# Patient Record
Sex: Male | Born: 1935 | Race: White | Hispanic: No | Smoking: Never smoker
Health system: Southern US, Community
[De-identification: ages and names within clinical notes are randomized; demographics above are authoritative.]

## PROBLEM LIST (undated history)

## (undated) DIAGNOSIS — G459 Transient cerebral ischemic attack, unspecified: Secondary | ICD-10-CM

## (undated) DIAGNOSIS — K219 Gastro-esophageal reflux disease without esophagitis: Secondary | ICD-10-CM

## (undated) DIAGNOSIS — E785 Hyperlipidemia, unspecified: Secondary | ICD-10-CM

## (undated) DIAGNOSIS — I2510 Atherosclerotic heart disease of native coronary artery without angina pectoris: Secondary | ICD-10-CM

## (undated) DIAGNOSIS — C679 Malignant neoplasm of bladder, unspecified: Secondary | ICD-10-CM

## (undated) DIAGNOSIS — E119 Type 2 diabetes mellitus without complications: Secondary | ICD-10-CM

## (undated) DIAGNOSIS — I639 Cerebral infarction, unspecified: Secondary | ICD-10-CM

## (undated) DIAGNOSIS — M1990 Unspecified osteoarthritis, unspecified site: Secondary | ICD-10-CM

## (undated) DIAGNOSIS — E669 Obesity, unspecified: Secondary | ICD-10-CM

## (undated) DIAGNOSIS — F419 Anxiety disorder, unspecified: Secondary | ICD-10-CM

## (undated) DIAGNOSIS — I10 Essential (primary) hypertension: Secondary | ICD-10-CM

## (undated) HISTORY — DX: Transient cerebral ischemic attack, unspecified: G45.9

## (undated) HISTORY — PX: BLADDER REPAIR: SHX76

## (undated) HISTORY — DX: Atherosclerotic heart disease of native coronary artery without angina pectoris: I25.10

## (undated) HISTORY — DX: Hyperlipidemia, unspecified: E78.5

## (undated) HISTORY — DX: Essential (primary) hypertension: I10

## (undated) HISTORY — DX: Cerebral infarction, unspecified: I63.9

## (undated) HISTORY — DX: Gastro-esophageal reflux disease without esophagitis: K21.9

## (undated) HISTORY — PX: TOTAL KNEE ARTHROPLASTY: SHX125

## (undated) HISTORY — PX: NASAL SINUS SURGERY: SHX719

## (undated) HISTORY — DX: Malignant neoplasm of bladder, unspecified: C67.9

## (undated) HISTORY — DX: Obesity, unspecified: E66.9

## (undated) HISTORY — PX: CORONARY STENT PLACEMENT: SHX1402

## (undated) HISTORY — PX: SHOULDER OPEN ROTATOR CUFF REPAIR: SHX2407

## (undated) HISTORY — DX: Anxiety disorder, unspecified: F41.9

## (undated) HISTORY — DX: Type 2 diabetes mellitus without complications: E11.9

## (undated) HISTORY — DX: Unspecified osteoarthritis, unspecified site: M19.90

---

## 1998-09-30 ENCOUNTER — Ambulatory Visit (HOSPITAL_COMMUNITY): Admission: RE | Admit: 1998-09-30 | Discharge: 1998-09-30 | Payer: Self-pay | Admitting: Orthopedic Surgery

## 2001-02-01 ENCOUNTER — Other Ambulatory Visit: Admission: RE | Admit: 2001-02-01 | Discharge: 2001-02-01 | Payer: Self-pay | Admitting: *Deleted

## 2001-02-01 ENCOUNTER — Encounter (INDEPENDENT_AMBULATORY_CARE_PROVIDER_SITE_OTHER): Payer: Self-pay | Admitting: Specialist

## 2002-12-03 ENCOUNTER — Encounter: Payer: Self-pay | Admitting: Orthopedic Surgery

## 2002-12-07 ENCOUNTER — Observation Stay (HOSPITAL_COMMUNITY): Admission: RE | Admit: 2002-12-07 | Discharge: 2002-12-08 | Payer: Self-pay | Admitting: Orthopedic Surgery

## 2004-11-13 ENCOUNTER — Ambulatory Visit: Payer: Self-pay | Admitting: Oncology

## 2007-10-12 ENCOUNTER — Inpatient Hospital Stay (HOSPITAL_COMMUNITY): Admission: RE | Admit: 2007-10-12 | Discharge: 2007-10-19 | Payer: Self-pay | Admitting: Orthopedic Surgery

## 2011-04-13 NOTE — Op Note (Signed)
NAME:  Clayton Johnson, Clayton Johnson NO.:  [id number]   MEDICAL RECORD NO.:  [id number]          PATIENT TYPE:  INP   LOCATION:  U981                         FACILITY:  [facility]   PHYSICIAN:  Marlowe Kays, M.D.  DATE OF BIRTH:  [birth year]   DATE OF PROCEDURE:  10/12/2007  DATE OF DISCHARGE:                               OPERATIVE REPORT   PREOPERATIVE DIAGNOSIS:  Osteoarthritis right knee.   POSTOPERATIVE DIAGNOSIS:  Osteoarthritis right knee.   OPERATION:  Osteonics Scorpio NRG total knee replacement right.   SURGEON:  Marlowe Kays, M.D.   ASSISTANTDruscilla Brownie. Cherlynn June.   ANESTHESIA:  General.   INDICATIONS FOR PROCEDURE:  A tricompartmental arthritis with marked  narrowing of medial joint with subluxation medially of the femur on the  tibia.  Accordingly I elected because he was a large individual I  elected to use a tibial extension as discussed below.   PROCEDURE IN DETAIL:  Prophylactic antibiotics.  Satisfied general  anesthesia.  Lateral hip stabilizer, surefoot and pneumatic tourniquet  the right leg was prepped with DuraPrep and draped in sterile field.  IV  employed.  Time-out performed.  Right leg was Esmarch'd out sterilely.  Tourniquet at 325 mmHg.  Vertical midline incision with median  parapatellar incision to open the joint.  Pace anserinus and medial  collateral ligament were dissected off the proximal tibia.  The patellar  mechanism was freed up.  Patella everted, the knee flexed.  Osteophytes  were removed from around the femur and the patella.  Patella was sized  at a 28.  I removed a portion of the ACL, PCL and anterior portions of  both menisci and placed a 5/16 inch drill hole in the distal femur  followed by canal finder and then the axis aligner set to 5 degree  valgus cut.  I elected to take a 10 mm cut since he had no substantial  flexion contracture.  After making this distal femoral cut we then sized  the femur at a #9.  The jig  for making the distal femoral cut was placed  over the previously determined lines from the sizing gauge and anterior  and posterior cuts and posterior and anterior chamferings made.  I then  went to the tibia where remnants of menisci and ACL and PCL were  removed.  A leveling cut was made and the tibia sized and likewise a #9.  I then placed a tibial base plate and made my initial intramedullary  drill hole followed by stepcut drill and canal finder.  I then used  intramedullary rod with the template for making a 9 degree and then I  used the guide for making a 90 degree cut removing 4 mm of the depressed  medial tibial plateau.  After making this cut I then placed a laminar  spreader and removed soft tissue and remnants of bone from the posterior  femoral condyles.  The jig for creating the post was then applied.  In  this system no patellar groove was indicated.  After making some  preliminary cuts with a saw to soften  up the bone and remove bone pieces  in the intercondylar area I then used the cutting instrument followed by  the impactor which completed the notchplasty.  I then went through a  trial reduction and found a 10 spacer seemed to fit nicely.  Using the  extramedullary guide splitting the bimalleolar distance I used the  baseplate markings on the anterior tibia for reference later.  While the  knee was in extension I then made a 10 mm recessed cut for 20 mm patella  followed by placing the guide from three fixation holes.  I then placed  the patella trimming excess bone from around the perimeter.  I then  returned to the tibia and we applied the baseplate saddled previously  marked scribe lines with stabilizing three pins and then used the tripod  apparatus to ream for the tibial keel up to a 9 cemented.  I then used  the additional reamer for the 80 mm stem working up to a 16 mm.  We then  went through a trial reduction with the 16 mm x 80 mm tibial extension  and found  that the base plate fit nicely on the tibia.   Accordingly we went ahead and water picked the knee while the components  were individually opened and the tibial extension attached.  Methylmethacrylate was then mixed and when it had hardened appropriately  we began gluing in the components starting first with the tibia with no  glue distal to the extension.  After impacting the tibial component and  removing excess methylmethacrylate we then glued in the femur and then  held it, after impacting it, held the femur in compacted position with  an 8 mm spacer and the knee extension.  We then glued in the patella  using patellar holding clamp.  When the glucose had hardened I removed  small particles of excess methylmethacrylate from around the knee and we  then went through another trial reduction, finding the 10 mm spacer was  the appropriate width.  Accordingly I went ahead and after irrigating  the tray placed the final prosthesis, 10 mm posterior stabilized, the  knee was reduced and found to be nice and stable.  Lateral release was  performed.  Hemovac inserted and the wound closed in layers with  interrupted #1 Vicryl in two layers in the quadriceps tendon, distally  in the synovium and capsule and in the deep subcutaneous tissue.  Superficial subcutaneous tissues were closed with 2-0 Vicryl, staples in  the skin.  Betadine, Adaptic dry sterile dressing were applied.  Tourniquet was released with about 101 minutes of operating time.  He  tolerated the procedure well and was taken to the recovery room in  satisfactory condition with no known complications.           ______________________________  Marlowe Kays, M.D.     JA/MEDQ  D:  10/12/2007  T:  10/13/2007  Job:  [id number]

## 2011-04-13 NOTE — H&P (Signed)
NAME:  Clayton Johnson, Clayton Johnson NO.:  [id number]   MEDICAL RECORD NO.:  [id number]          PATIENT TYPE:  INP   LOCATION:  NA                           FACILITY:  [facility]   PHYSICIAN:  Marlowe Kays, M.D.  DATE OF BIRTH:  1934/06/10   DATE OF ADMISSION:  10/12/2007  DATE OF DISCHARGE:                              HISTORY & PHYSICAL   CHIEF COMPLAINT:  Pain in my right knee.   HISTORY OF PRESENT ILLNESS:  This 77 year old white male seen by [place] for  continuing and progressive problems concerning his right knee.  He has  documented osteoarthritis with tricompartmental arthritis with near bone-  on-bone deformities.  We have tried him with supplementation as well as  corticosteroids in the knee and exercise program and unfortunately, this  has not helped him.  He has extreme difficulty squatting down and coming  to a standing position.  He has to be helped up during this evaluation.  After much consideration including the risks and benefits of surgery, we  decided he would benefit from surgical intervention and is being  admitted for total knee replacement arthroplasty of right knee.   This patient lives alone, but he will have a friend to stay with him for  awhile.  He will also have home health.   PAST MEDICAL HISTORY:  1. Insulin-dependent diabetic.  2. Hypertension.  3. Dyslipidemia.  4. Reflux.   CURRENT MEDICATIONS:  1. Lantus insulin 50 units per day.  2. Humalog p.r.n.  3. Actos 45 mg one per day.  4. Metformin hydrochloride 1000 mg two per day.  5. Omeprazole 20 mg b.i.d.  6. Lipitor 20 mg daily.  7. Hydrochlorothiazide daily.  8. Quinapril hydrochloride 10 mg daily.  9. Clonidine 0.2 mg daily.   PRIMARY CARE PHYSICIAN:  Dr. Roney Marion is his family physician and  Dr. Leslie Dales is his endocrinologist.   ALLERGIES:  He is allergic to certain foods and inhalants, however, he  has no medical allergies.   PAST SURGICAL HISTORY:  1. Right knee scope in  December 1999.  2. Right shoulder surgery with reconstruction secondary to dislocation      in the early 60s.  3. He has had some sort of left shoulder surgery.   FAMILY HISTORY:  Positive for heart disease in his brother, diabetes in  brother and sister and stroke in his father.   SOCIAL HISTORY:  The patient divorced, retired and has no intake of  alcohol or tobacco products.  He does have three children.   REVIEW OF SYSTEMS:  CNS:  No seizure disorder, paralysis, numbness,  double vision.  RESPIRATORY:  No productive cough, no hemoptysis or  shortness of breath.  CARDIOVASCULAR:  No chest pain, no angina,  orthopnea.  GASTROINTESTINAL:  No nausea, vomiting, melena or bloody  stool.  GENITOURINARY:  No discharge, dysuria or hematuria.  MUSCULOSKELETAL:  Primarily in present illness with his right knee.   PHYSICAL EXAMINATION:  GENERAL:  Alert, cooperative and friendly, 27-  year-old, white male who was alert and oriented x3.  He is in no acute  distress.  VITAL SIGNS:  Blood pressure 120/66, pulse 80, respirations of 12.  HEENT:  Normocephalic.  Wears glasses.  PERRLA, EOMI.  Oropharynx is  clear.  NECK:  Supple with no lymphadenopathy.  CHEST:  Clear to auscultation, however, breath sounds are diminished on  the left in all lung fields.  HEART:  Regular rate and rhythm.  No murmurs are heard.  ABDOMEN:  Obese, soft, nontender.  Liver and spleen not felt.  GENITALIA:  Rectal not done, not pertinent to present illness.  EXTREMITIES:  The patient has crepitus range of motion of the right  knee.  There is fullness about the knee as well and tenderness in the  popliteal area.   ADMISSION DIAGNOSIS:  1. Osteoarthritis of the right knee.  2. Hypertension.  3. Reflux.  4. Insulin-dependent diabetes mellitus.  5. Dyslipidemia.  6. Diminished lung sounds on the left.   PLAN:  The patient will undergo right total knee replacement  arthroplasty.  He is to have a chest x-ray on his  preoperative visit to  the hospital.  Hopefully, there will be no abnormal pathological  findings in the lungs that is just an auscultation situation.      Dooley L. Cherlynn June.    ______________________________  Marlowe Kays, M.D.    DLU/MEDQ  D:  10/04/2007  T:  10/05/2007  Job:  [id number]   cc:   Roney Marion, M.D.   Veverly Fells. Al[place]imer, M.D.  Fax: [phone]

## 2011-04-16 NOTE — Op Note (Signed)
NAME:  Clayton Johnson, Clayton Johnson                      ACCOUNT NO.:  [id number]   MEDICAL RECORD NO.:  [id number]                   PATIENT TYPE:  AMB   LOCATION:  DAY                                  FACILITY:  [facility]   PHYSICIAN:  Marlowe Kays, M.D.               DATE OF BIRTH:  1934/05/03   DATE OF PROCEDURE:  12/07/2002  DATE OF DISCHARGE:                                 OPERATIVE REPORT   PREOPERATIVE DIAGNOSES:  1. Labral and biceps tendon disruption.  2. Chronic impingement syndrome with probable full-thickness rotator cuff     tear.   POSTOPERATIVE DIAGNOSES:  1. Labral and biceps tendon disruption.  2. Partial rotator cuff tear.   OPERATION:  1. Left shoulder arthroscopy with debridement of the underneath surface of     the rotator cuff, long head of the biceps tendon, and labrum.  2. Arthroscopic subacromial decompression.   SURGEON:  Illene Labrador. [place], M.D.   ASSISTANTDruscilla Brownie. Idolina Primer, P.A.-C.   ANESTHESIA:  General.   PATHOLOGY AND JUSTIFICATION FOR PROCEDURE:  Chronic pain, left shoulder with  MRI demonstrating what was felt to be a very macerated rotator cuff with  probable full-thickness tears and the other abnormalities noted on the  diagnosis.  At surgery, there did not appear to be any full-thickness  rotator cuff tears, both visually and also the fact that no arthroscopic  fluids from the glenohumeral joint went up into the subacromial joint.   DESCRIPTION OF PROCEDURE:  Prophylactic antibiotics, satisfactory general  anesthesia, beach chair position on the Schlein frame, the left shoulder  girdle was prepped with DuraPrep and draped in a sterile field, and the  anatomy of the shoulder joint was marked out, and a lateral portal,  posterior soft spot portal, and the subacromial space were all infiltrated  with 0.5% Marcaine with adrenalin.  Through a posterior soft spot portal, I  used a blunt trocar to easily enter the glenohumeral joint with  significant  disruption of the labrum but no complete detachment, a good bit of fraying  of the long head of the biceps tendon but basically, it was intact, and a  good of fraying and synovectomy on the surface of the rotator cuff.  There  was also a little wear area on the posterior humeral head from history of  prior dislocation possibly.  After noting the pathology, I advanced the  scope between the long head of the biceps tendon, subscapularis anteriorly  and made a stab wound there and placed a metal cannula over the switching  stick and then used a 4.2 shaver which I introduced in the anterior joint  and then debrided down the labrum, the long head of the biceps tendon, and  the surface of the rotator cuff.  Again, no fluid was noted to be exiting  into the subacromial space.  Consequently, after draining the glenohumeral  joint of all fluid possible, I then  redirected the scope in the subacromial  area and through a lateral portal, used a 4.2 shaver.  He had a large amount  of bursal tissue and some fraying of the rotator cuff but basically once  again, the fact that he did not have a full-thickness rotator cuff was  confirmed.  I then used the 4.2 shaver through the lateral portal to clean  up the bursa and then used the ArthroCare vaporizer to vaporize soft tissue  on the underneath surface of the acromion, going back medial to the [facility]  joint.  This was causing some impingement on the underlying rotator cuff.  The used a 4.0 oval bur and began shaving down the underneath surface of the  acromion and AC joint, then went back and forth between the bur, the 4.2  shaver, and the vaporizer until we had thorough decompression.  This was  documented using the vaporizer head with the arm at the side and the arm  abducted.  We then removed all fluid possible from the subacromial space  which was reinfiltrated with 0.5% Marcaine with adrenalin as well as three  portals which were then closed  with 4-0 nylon.  Betadine, Adaptic dry  sterile dressing were applied.  He was placed into a large sling and taken  to the recovery room in satisfactory condition with no known complications.                                               Marlowe Kays, M.D.    JA/MEDQ  D:  12/07/2002  T:  12/07/2002  Job:  [id number]

## 2011-04-16 NOTE — Discharge Summary (Signed)
NAME:  Clayton Johnson, Clayton Johnson              ACCOUNT NO.:  [id number]   MEDICAL RECORD NO.:  [id number]          PATIENT TYPE:  INP   LOCATION:  1611                         [facility]ACILITY[facility]   PHYSICIAN:  Marlowe Kays, M.D.  DATE OF BIRTH:  Nov 04, 1934   DATE OF ADMISSION:  10/12/2007  DATE OF DISCHARGE:  10/19/2007                               DISCHARGE SUMMARY   ADMITTING DIAGNOSES:  1. Osteoarthritis of the right knee.  2. Hypertension.  3. Reflux.  4. Insulin-dependent diabetes mellitus.  5. Dyslipidemia.  6. Noted diminished lung sounds on the left.   DISCHARGE DIAGNOSES:  1. Osteoarthritis of the right knee.  2. Hypertension.  3. Reflux.  4. Insulin-dependent diabetes mellitus.  5. Dyslipidemia.  6. Noted diminished lung sounds on the left.  7. Mild postoperative anemia.  8. Preoperative x-ray with no active cardiopulmonary disease.   OPERATION:  On October 12, 2007, the patient underwent Osteonics  Scorpio NRG total knee replacement arthroplasty to the right knee.  D.L.  Underwood assisted.   BRIEF HISTORY:  This is a 77 year old male with progressive problems  concerning his right knee and documented tricompartmental arthritis.  Viscosupplementation and corticosteroids om the knee with exercises have  not helped him with his right knee.  Due to the fact that he has  interference with his day-to-day activity, he decided to go for total  knee replacement arthroplasty.  Risks and benefits were described to the  patient.   COURSE IN THE HOSPITAL:  The patient tolerated the surgical procedure  quite well.  Placed on Coumadin protocol immediately postoperatively for  the prevention of DVT and continue to do so.  He had urinary retention.  He was placed on Flomax, and he eventually was able to void.   Wound remained clean and dry, with no evidence of an infection.  He ran  intermittent fevers, with temperature elevated to 101.8, but this  varied.  There was some redness  about the wound.  He was placed on  Keflex p.o. after the usual Ancef protocol had run out.  He was afebrile  for 24/48 hours prior to discharge, and when seen by Dr. Simonne Come on  the day of discharge the patient was doing well with physical therapy.  The leg redness had diminished, and he was not febrile.   LABORATORY VALUES:  Hematologically showed a hemoglobin preoperatively  of 13.1, hematocrit of 38.7.  Final hemoglobin was 8.1.  He was  asymptomatic.  Blood chemistries were all essentially normal, other than  elevated glucose.  Sodium varied, and he had a very mild hyponatremia at  132 at discharge.  GFR was 32.  Urinalysis negative for urinary tract  infection.  Electrocardiogram showed normal sinus rhythm Chest x-ray  showed no active cardiopulmonary disease.   Of note, the patient was placed on insulin protocol postoperatively.   CONDITION ON DISCHARGE:  Improved, stable.   PLAN:  The patient was discharged to his home.  Continued weightbearing  as tolerated.  Follow up with his family physicians and internal  medicine doctors as indicated.   MEDICATION AT DISCHARGE:  1. Lantus  50 units in the morning.  2. Humalog Pen 100/140, 6 units as needed.  3. Actos 45 mg in the morning.  4. Metformin 1000 mg b.i.d.  5. Omeprazole 20 mg the morning.  6. Lipitor 20 mg at bedtime.  7. Hydrochlorothiazide 1 in the morning.  8. Quinapril 40 mg in the morning.  9. Clonidine 0.2 mg at bedtime.  10.Aspirin 81 mg daily (will take aspirin while on Coumadin).   MEDICATIONS THAT WE GAVE AT DISCHARGE:  1. Tylox for pain.  2. Keflex as an antibiotic.  3. Robaxin as a muscle relaxant.  4. FeSO4 for his anemia.  5. Coumadin per Coumadin protocol postop.   He is return to the office about 2 weeks after the date of surgery.  Use  dry dressing as needed.  He is encouraged to call should he have any  problems or questions.     Continue diet as at home.      Dooley L. Cherlynn June.    ______________________________  Marlowe Kays, M.D.   DLU/MEDQ  D:  11/01/2007  T:  11/01/2007  Job:  [id number]   cc:   Roney Marion, M.D.   Veverly Fells. [place], M.D.  Fax: [phone]

## 2011-09-07 LAB — PROTIME-INR
INR: 1.5
INR: 1.5
INR: 1.7 — ABNORMAL HIGH
INR: 2 — ABNORMAL HIGH
INR: 1
Prothrombin Time: 18.2 — ABNORMAL HIGH
Prothrombin Time: 20.4 — ABNORMAL HIGH
Prothrombin Time: 22.9 — ABNORMAL HIGH
Prothrombin Time: 23.8 — ABNORMAL HIGH
Prothrombin Time: 13.7

## 2011-09-07 LAB — BASIC METABOLIC PANEL
BUN: 27 — ABNORMAL HIGH
BUN: 20
BUN: 20
CO2: 31
CO2: 29
Calcium: 8.2 — ABNORMAL LOW
Chloride: 95 — ABNORMAL LOW
Chloride: 102
Chloride: 99
Creatinine, Ser: 2.07 — ABNORMAL HIGH
Creatinine, Ser: 1.39
GFR calc Af Amer: 38 — ABNORMAL LOW
GFR calc non Af Amer: 32 — ABNORMAL LOW
GFR calc non Af Amer: 50 — ABNORMAL LOW
GFR calc non Af Amer: 56 — ABNORMAL LOW
Glucose, Bld: 253 — ABNORMAL HIGH
Glucose, Bld: 264 — ABNORMAL HIGH
Glucose, Bld: 304 — ABNORMAL HIGH
Potassium: 4.3
Potassium: 4.9
Potassium: 5
Sodium: 132 — ABNORMAL LOW
Sodium: 139

## 2011-09-07 LAB — CBC
HCT: 23.3 — ABNORMAL LOW
HCT: 29.8 — ABNORMAL LOW
Hemoglobin: 8.7 — ABNORMAL LOW
MCHC: 35.3
MCV: 92.4
Platelets: 145 — ABNORMAL LOW
Platelets: 182
RBC: 2.67 — ABNORMAL LOW
RDW: 14
RDW: 14.5
RDW: 14.4
WBC: 8.3

## 2011-09-07 LAB — CREATININE, SERUM
Creatinine, Ser: 1.26
GFR calc Af Amer: >60

## 2011-09-07 LAB — ABO/RH: ABO/RH(D): O POS

## 2011-09-07 LAB — URINALYSIS, ROUTINE W REFLEX MICROSCOPIC
Glucose, UA: >1000 — AB
Hgb urine dipstick: NEGATIVE
Leukocytes, UA: NEGATIVE
Protein, ur: NEGATIVE
Specific Gravity, Urine: 1.013
pH: 5.5

## 2011-09-07 LAB — URINE MICROSCOPIC-ADD ON: Urine-Other: NONE SEEN

## 2011-09-07 LAB — HEMOGLOBIN: Hemoglobin: 8.1 — ABNORMAL LOW

## 2012-11-08 ENCOUNTER — Other Ambulatory Visit: Payer: Self-pay | Admitting: Neurology

## 2012-11-08 DIAGNOSIS — G459 Transient cerebral ischemic attack, unspecified: Secondary | ICD-10-CM

## 2012-11-08 DIAGNOSIS — I679 Cerebrovascular disease, unspecified: Secondary | ICD-10-CM

## 2012-11-14 ENCOUNTER — Ambulatory Visit
Admission: RE | Admit: 2012-11-14 | Discharge: 2012-11-14 | Disposition: A | Discharge status: Home / Self Care | Payer: Medicare Other | Source: Ambulatory Visit | Attending: Neurology | Admitting: Neurology

## 2012-11-14 DIAGNOSIS — I679 Cerebrovascular disease, unspecified: Secondary | ICD-10-CM

## 2012-11-14 DIAGNOSIS — G459 Transient cerebral ischemic attack, unspecified: Secondary | ICD-10-CM

## 2012-12-05 DIAGNOSIS — G459 Transient cerebral ischemic attack, unspecified: Secondary | ICD-10-CM | POA: Insufficient documentation

## 2012-12-05 DIAGNOSIS — I679 Cerebrovascular disease, unspecified: Secondary | ICD-10-CM | POA: Insufficient documentation

## 2012-12-08 ENCOUNTER — Other Ambulatory Visit: Payer: Self-pay | Admitting: Neurology

## 2012-12-08 DIAGNOSIS — G459 Transient cerebral ischemic attack, unspecified: Secondary | ICD-10-CM

## 2012-12-08 DIAGNOSIS — I679 Cerebrovascular disease, unspecified: Secondary | ICD-10-CM

## 2012-12-14 ENCOUNTER — Ambulatory Visit
Admission: RE | Admit: 2012-12-14 | Discharge: 2012-12-14 | Disposition: A | Discharge status: Home / Self Care | Payer: Medicare Other | Source: Ambulatory Visit | Attending: Neurology | Admitting: Neurology

## 2012-12-14 DIAGNOSIS — G459 Transient cerebral ischemic attack, unspecified: Secondary | ICD-10-CM

## 2012-12-14 DIAGNOSIS — I679 Cerebrovascular disease, unspecified: Secondary | ICD-10-CM

## 2012-12-14 MED ORDER — IOHEXOL 350 MG/ML SOLN
100.0000 mL | Freq: Once | INTRAVENOUS | Status: AC | PRN
  Administered 2012-12-14: 100 mL via INTRAVENOUS

## 2013-03-26 ENCOUNTER — Ambulatory Visit: Payer: Self-pay | Admitting: Neurology

## 2013-03-26 ENCOUNTER — Encounter: Payer: Self-pay | Admitting: Neurology

## 2013-03-26 DIAGNOSIS — I679 Cerebrovascular disease, unspecified: Secondary | ICD-10-CM

## 2013-03-26 DIAGNOSIS — G459 Transient cerebral ischemic attack, unspecified: Secondary | ICD-10-CM

## 2015-09-12 DIAGNOSIS — E782 Mixed hyperlipidemia: Secondary | ICD-10-CM | POA: Insufficient documentation

## 2015-09-12 DIAGNOSIS — I2510 Atherosclerotic heart disease of native coronary artery without angina pectoris: Secondary | ICD-10-CM | POA: Insufficient documentation

## 2016-01-27 DIAGNOSIS — B351 Tinea unguium: Secondary | ICD-10-CM | POA: Insufficient documentation

## 2016-03-10 ENCOUNTER — Other Ambulatory Visit: Payer: Self-pay

## 2017-03-03 DIAGNOSIS — I10 Essential (primary) hypertension: Secondary | ICD-10-CM | POA: Diagnosis not present

## 2017-03-03 DIAGNOSIS — E119 Type 2 diabetes mellitus without complications: Secondary | ICD-10-CM | POA: Diagnosis not present

## 2017-03-03 DIAGNOSIS — I2510 Atherosclerotic heart disease of native coronary artery without angina pectoris: Secondary | ICD-10-CM | POA: Diagnosis not present

## 2017-03-03 DIAGNOSIS — E782 Mixed hyperlipidemia: Secondary | ICD-10-CM | POA: Diagnosis not present

## 2017-03-21 DIAGNOSIS — E119 Type 2 diabetes mellitus without complications: Secondary | ICD-10-CM | POA: Diagnosis not present

## 2017-03-21 DIAGNOSIS — I10 Essential (primary) hypertension: Secondary | ICD-10-CM | POA: Diagnosis not present

## 2017-03-21 DIAGNOSIS — E782 Mixed hyperlipidemia: Secondary | ICD-10-CM | POA: Diagnosis not present

## 2017-03-21 DIAGNOSIS — I2510 Atherosclerotic heart disease of native coronary artery without angina pectoris: Secondary | ICD-10-CM | POA: Diagnosis not present

## 2017-03-23 DIAGNOSIS — Z79899 Other long term (current) drug therapy: Secondary | ICD-10-CM | POA: Diagnosis not present

## 2017-03-23 DIAGNOSIS — Z0000 Encounter for general adult medical examination without abnormal findings: Secondary | ICD-10-CM | POA: Diagnosis not present

## 2017-03-23 DIAGNOSIS — E785 Hyperlipidemia, unspecified: Secondary | ICD-10-CM | POA: Diagnosis not present

## 2017-03-23 DIAGNOSIS — I10 Essential (primary) hypertension: Secondary | ICD-10-CM | POA: Diagnosis not present

## 2017-03-23 DIAGNOSIS — R413 Other amnesia: Secondary | ICD-10-CM | POA: Diagnosis not present

## 2017-03-23 DIAGNOSIS — E1165 Type 2 diabetes mellitus with hyperglycemia: Secondary | ICD-10-CM | POA: Diagnosis not present

## 2017-04-13 DIAGNOSIS — I639 Cerebral infarction, unspecified: Secondary | ICD-10-CM | POA: Diagnosis not present

## 2017-04-13 DIAGNOSIS — R2681 Unsteadiness on feet: Secondary | ICD-10-CM | POA: Diagnosis not present

## 2017-05-02 DIAGNOSIS — M6281 Muscle weakness (generalized): Secondary | ICD-10-CM | POA: Diagnosis not present

## 2017-05-02 DIAGNOSIS — I639 Cerebral infarction, unspecified: Secondary | ICD-10-CM | POA: Diagnosis not present

## 2017-05-02 DIAGNOSIS — R2681 Unsteadiness on feet: Secondary | ICD-10-CM | POA: Diagnosis not present

## 2017-05-05 DIAGNOSIS — I639 Cerebral infarction, unspecified: Secondary | ICD-10-CM | POA: Diagnosis not present

## 2017-05-05 DIAGNOSIS — R2681 Unsteadiness on feet: Secondary | ICD-10-CM | POA: Diagnosis not present

## 2017-05-05 DIAGNOSIS — M6281 Muscle weakness (generalized): Secondary | ICD-10-CM | POA: Diagnosis not present

## 2017-05-16 ENCOUNTER — Ambulatory Visit (INDEPENDENT_AMBULATORY_CARE_PROVIDER_SITE_OTHER): Payer: Medicare Other | Admitting: Neurology

## 2017-05-16 ENCOUNTER — Encounter: Payer: Self-pay | Admitting: Neurology

## 2017-05-16 VITALS — BP 126/64 | HR 65 | Ht 68.5 in | Wt 202.4 lb

## 2017-05-16 DIAGNOSIS — I639 Cerebral infarction, unspecified: Secondary | ICD-10-CM

## 2017-05-16 DIAGNOSIS — I6381 Other cerebral infarction due to occlusion or stenosis of small artery: Secondary | ICD-10-CM

## 2017-05-16 NOTE — Patient Instructions (Addendum)
Remember to drink plenty of fluid, eat healthy meals and do not skip any meals. Try to eat protein with a every meal and eat a healthy snack such as fruit or nuts in between meals. Try to keep a regular sleep-wake schedule and try to exercise daily, particularly in the form of walking, 20-30 minutes a day, if you can.   As far as your medications are concerned, I would like to suggest: Continue current medications  As far as diagnostic testing: CTA of the head and neck  I would like to see you back in 6 months, sooner if we need to. Please call us with any interim questions, concerns, problems, updates or refill requests.   Our phone number is [phone]. We also have an after hours call service for urgent matters and there is a physician on-call for urgent questions. For any emergencies you know to call 911 or go to the nearest emergency room   Ischemic Stroke An ischemic stroke (cerebrovascular accident, or CVA) is the sudden death of brain tissue that occurs when an area of the brain does not get enough oxygen. It is a medical emergency that must be treated right away. An ischemic stroke can cause permanent loss of brain function. This can cause problems with how different parts of your body function. What are the causes? This condition is caused by a decrease of oxygen supply to an area of the brain, which may be the result of:  A small blood clot (embolus) or a buildup of plaque in the blood vessels (atherosclerosis) that blocks blood flow in the brain.  An abnormal heart rhythm (atrial fibrillation).  A blocked or damaged artery in the head or neck.  What increases the risk? Certain factors may make you more likely to develop this condition. Some of these factors are things that you can change, such as:  Obesity.  Smoking cigarettes.  Taking oral birth control, especially if you also use tobacco.  Physical inactivity.  Excessive alcohol use.  Use of illegal drugs,  especially cocaine and methamphetamine.  Other risk factors include:  High blood pressure (hypertension).  High cholesterol.  Diabetes mellitus.  Heart disease.  Being [place] American, Native American, Hispanic, or [place] Native.  Being over age 56.  Family history of stroke.  Previous history of blood clots, stroke, or transient ischemic attack (TIA).  Sickle cell disease.  Being a woman with a history of preeclampsia.  Migraine headache.  Sleep apnea.  Irregular heartbeats, such as atrial fibrillation.  Chronic inflammatory diseases, such as rheumatoid arthritis or lupus.  Blood clotting disorders (hypercoagulable state).  What are the signs or symptoms? Symptoms of this condition usually develop suddenly, or you may notice them after waking up from sleep. Symptoms may include sudden:  Weakness or numbness in your face, arm, or leg, especially on one side of your body.  Trouble walking or difficulty moving your arms or legs.  Loss of balance or coordination.  Confusion.  Slurred speech (dysarthria).  Trouble speaking, understanding speech, or both (aphasia).  Vision changes-such as double vision, blurred vision, or loss of vision-inone or both eyes.  Dizziness.  Nausea and vomiting.  Severe headache with no known cause. The headache is often described as the worst headache ever experienced.  If possible, make note of the exact time that you last felt like your normal self and what time your symptoms started. Tell your health care provider. If symptoms come and go, this could be a sign of a  warning stroke, or TIA. Get help right away, even if you feel better. How is this diagnosed? This condition may be diagnosed based on:  Your symptoms, your medical history, and a physical exam.  CT scan of the brain.  MRI.  CT angiogram. This test uses a computer to take X-rays of your arteries. A dye may be injected into your blood to show the inside of your  blood vessels more clearly.  MRI angiogram. This is a type of MRI that is used to evaluate the blood vessels.  Cerebral angiogram. This test uses X-rays and a dye to show the blood vessels in the brain and neck.  You may need to see a health care provider who specializes in stroke care. A stroke specialist can be seen in person or through communication using telephone or television technology (telemedicine). Other tests may also be done to find the cause of the stroke, such as:  Electrocardiogram (ECG).  Continuous heart monitoring.  Echocardiogram.  Carotid ultrasound.  A scan of the brain circulation.  Blood tests.  Sleep study to check for sleep apnea.  How is this treated? Treatment for this condition will depend on the duration, severity, and cause of your symptoms and on the area of the brain affected. It is very important to get treatment at the first sign of stroke symptoms. Some treatments work better if they are done within 3-6 hours of the onset of stroke symptoms. These initial treatments may include:  Aspirin.  Medicines to control blood pressure.  Medicine given by injection to dissolve the blood clot (thrombolytic).  Treatments given directly to the affected artery to remove or dissolve the blood clot.  Other treatment options may include:  Oxygen.  IV fluids.  Medicines to thin the blood (anticoagulants or antiplatelets).  Procedures to increase blood flow.  Medicines and changes to your diet may be used to help treat and manage risk factors for stroke, such as diabetes, high cholesterol, and high blood pressure. After a stroke, you may work with physical, speech, mental health, or occupational therapists to help you recover. Follow these instructions at home: Medicines  Take over-the-counter and prescription medicines only as told by your health care provider.  If you were told to take a medicine to thin your blood, such as aspirin or an  anticoagulant, take it exactly as told by your health care provider. ? Taking too much blood-thinning medicine can cause bleeding. ? If you do not take enough blood-thinning medicine, you will not have the protection that you need against another stroke and other problems.  Understand the side effects of taking anticoagulant medicine. When taking this type of medicine, make sure you: ? Hold pressure over any cuts for longer than usual. ? Tell your dentist and other health care providers that you are taking anticoagulants before you have any procedures that may cause bleeding. ? Avoid activities that may cause trauma or injury. Eating and drinking  Follow instructions from your health care provider about diet.  Eat healthy foods.  If your ability to swallow was affected by the stroke, you may need to take steps to avoid choking, such as: ? Taking small bites when eating. ? Eating foods that are soft or pureed. Safety  Follow instructions from your health care team about physical activity.  Use a walker or cane as told by your health care provider.  Take steps to create a safe home environment in order to reduce the risk of falls. This may include: ?  Having your home looked at by specialists. ? Installing grab bars in the bedroom and bathroom. ? Using safety equipment, such as raised toilets and a seat in the shower. General instructions  Do not use any tobacco products, such as cigarettes, chewing tobacco, and e-cigarettes. If you need help quitting, ask your health care provider.  Limit alcohol intake to no more than 1 drink a day for nonpregnant women and 2 drinks a day for men. One drink equals 12 oz of beer, 5 oz of wine, or 1 oz of hard liquor.  If you need help to stop using drugs or alcohol, ask your health care provider about a referral to a program or specialist.  Maintain an active and healthy lifestyle. Get regular exercise as told by your health care provider.  Keep  all follow-up visits as told by your health care provider, including visits with all specialists on your health care team. This is important. How is this prevented? Your risk of another stroke can be decreased by managing high blood pressure, high cholesterol, diabetes, heart disease, sleep apnea, and obesity. It can also be decreased by quitting smoking, limiting alcohol, and staying physically active. Your health care provider will continue to work with you on measures to prevent short-term and long-term complications of stroke. Get help right away if: You have:  Sudden weakness or numbness in your face, arm, or leg, especially on one side of your body.  Sudden confusion.  Sudden trouble speaking, understanding, or both (aphasia).  Sudden trouble seeing with one or both eyes.  Sudden trouble walking or difficulty moving your arms or legs.  Sudden dizziness.  Sudden loss of balance or coordination.  Sudden, severe headache with no known cause.  A partial or total loss of consciousness.  A seizure. Any of these symptoms may represent a serious problem that is an emergency. Do not wait to see if the symptoms will go away. Get medical help right away. Call your local emergency services (911 in [place] Do not drive yourself to the hospital. This information is not intended to replace advice given to you by your health care provider. Make sure you discuss any questions you have with your health care provider. Document Released: 11/15/2005 Document Revised: 04/27/2016 Document Reviewed: 02/11/2016 Elsevier Interactive Patient Education  2017 [employer].

## 2017-05-16 NOTE — Progress Notes (Signed)
[facility]    Provider:  Dr Jaynee Eagles Referring Provider: Ronita Hipps, MD Primary Care Physician:  Ronita Hipps, MD  CC:  Stroke  HPI:  Clayton Johnson is a 83 y.o. male here as a referral from Dr. Helene Kelp for strokes. PMHx uncontrolled diabetes(hgba1c 9.1), uncontrolled HTN, dementia, previous lacunar strokes,  Here with his daughter and his caretaker.  Patient was at church on a Sunday in May and he had an episode, he could hardly walk to his car, caretaker noticed he had no balance, he was leaning to the left. He worsened. That week they saw their primary care and had an MRi which showed a small lacunar cerebellar stroke. Things are better. He was fatigued and tired however. He is lethargic. He hasn't been hungry. Caretaker is there 4 days a week and helps, he sometimes misses his medications however.He has dementia and is on Aricept. He had an echocardiogram but did not have imaging of the blood vessels of his head or neck. He is on Plavix and aspirin but, again, sometimes he misses his medication. Patient has dementia and is on Aricept. His symptoms have resolved since then.No other focal neurologic deficits, associated symptoms, inciting events or modifiable factors.  Reviewed notes, labs and imaging from outside physicians, which showed:  Reviewed labs drawn 03/23/2017 which included CBC with mild anemia hemoglobin 11.9 otherwise normal, CMP with BUN 25 and creatinine 1.56, glucose 192, GFR 46 otherwise unremarkable, LDL 85, TSH normal  Reviewed primary care notes. 83 year old who presented with fatigue. He was leaving church and not feeling as well. Felt unsteady on his feet. His blood pressure had been lower and he had not had appetite.  Reviewed MRI of the brain with and without contrast media report 04/13/2017 which showed punctate acute infarct in the deep right cerebellum, background of chronic small vessel disease with numerous remote lacunar infarcts in the  brainstem and deep gray nuclei. Remote microhemorrhages in the same locations in a hypertensive pattern.  Review of Systems: Patient complains of symptoms per HPI as well as the following symptoms: No CP, no SOB. Pertinent negatives and positives per HPI. All others negative.   Social History   Social History  . Marital status: Divorced    Spouse name: N/A  . Number of children: 3  . Years of education: 12+   Occupational History  . RETIRED    Social History Main Topics  . Smoking status: Never Smoker  . Smokeless tobacco: Never Used  . Alcohol use No  . Drug use: No  . Sexual activity: Not on file   Other Topics Concern  . Not on file   Social History Narrative   Lives at home alone   Right-handed   Caffeine: diet Mt Dew    Family History  Problem Relation Age of Onset  . Heart disease Mother   . Stroke Father   . Renal Disease Sister   . Heart attack Brother     Past Medical History:  Diagnosis Date  . Anxiety disorder   . Bladder cancer ([place])   . Coronary artery disease   . Degenerative arthritis   . Diabetes ([place])   . Dyslipidemia   . GERD (gastroesophageal reflux disease)   . Hypertension   . Obesity   . TIA (transient ischemic attack)    MULTIPLE,BIHEMISPHERIC    Past Surgical History:  Procedure Laterality Date  . BLADDER REPAIR    . CORONARY STENT PLACEMENT    .  NASAL SINUS SURGERY    . SHOULDER OPEN ROTATOR CUFF REPAIR Right   . TOTAL KNEE ARTHROPLASTY Right     Current Outpatient Prescriptions  Medication Sig Dispense Refill  . aspirin EC 81 MG tablet Take 81 mg by mouth.    . cetirizine (ZYRTEC) 10 MG tablet Take 10 mg by mouth daily.    . clopidogrel (PLAVIX) 75 MG tablet Take 75 mg by mouth daily.    Marland Kitchen donepezil (ARICEPT) 5 MG tablet Take 5 mg by mouth.    . isosorbide mononitrate (IMDUR) 30 MG 24 hr tablet Take 30 mg by mouth daily.    Marland Kitchen JANUMET XR 50-500 MG TB24 Take 50-500 mg by mouth 2 (two) times daily.    . metoprolol  tartrate (LOPRESSOR) 25 MG tablet Take 25 mg by mouth 2 (two) times daily.    . pravastatin (PRAVACHOL) 40 MG tablet Take 40 mg by mouth daily.    . quinapril (ACCUPRIL) 40 MG tablet Take 40 mg by mouth daily.     No current facility-administered medications for this visit.     Allergies as of 05/16/2017  . (No Known Allergies)    Vitals: BP 126/64   Pulse 65   Ht 5' 8.5" (1.74 m)   Wt 202 lb 6.4 oz (91.8 kg)   BMI 30.33 kg/m  Last Weight:  Wt Readings from Last 1 Encounters:  05/16/17 202 lb 6.4 oz (91.8 kg)   Last Height:   Ht Readings from Last 1 Encounters:  05/16/17 5' 8.5" (1.74 m)    Physical exam: Exam: Gen: NAD, conversant, well nourised, obese, well groomed                     CV: RRR, no MRG. No Carotid Bruits. No peripheral edema, warm, nontender Eyes: Conjunctivae clear without exudates or hemorrhage  Neuro: Detailed Neurologic Exam  Speech:    Speech is normal; fluent and spontaneous with normal comprehension.  Cognition:    The patient is oriented to person    recent and remote memory Impaired;     language fluent;     Impaired attention, concentration, fund of knowledge Cranial Nerves:    The pupils are equal, round, and reactive to light. Attempted funduscopic exam could not visualize due to small pupils Visual fields are full to finger confrontation. Extraocular movements are intact. Trigeminal sensation is intact and the muscles of mastication are normal. The face is symmetric. The palate elevates in the midline. Hearing intact. Voice is normal. Shoulder shrug is normal. The tongue has normal motion without fasciculations.   Coordination:    Normal finger to nose and heel to shin.  Gait:    Normal stride, stance  Motor Observation:    No asymmetry, no atrophy, and no involuntary movements noted. Tone:    Normal muscle tone.    Posture:    Posture is normal. normal erect    Strength:    Strength is V/V in the upper and lower limbs.        Sensation: intact to LT     Reflex Exam:  DTR's:    Deep tendon reflexes in the upper and lower extremities are symmetrical bilaterally.   Toes:    The toes are equivocal bilaterally.   Clonus:    Clonus is absent.      Assessment/Plan:  83 y.o. male here as a referral from Dr. Helene Kelp for multiple lacunar strokes. PMHx uncontrolled diabetes(hgba1c 9.1), uncontrolled HTN, dementia, previous lacunar  strokes. He is on aspirin and Plavix however he does sometimes miss his medications. Patient had an episode of imbalance leaning to the left, dizziness and a follow-up MRI showed a small lacunar cerebellar stroke. Patient's symptoms have resolved. We'll complete the stroke workup. Patient and daughter report he had a recent echocardiogram within the last month. But he has not had imaging of the head or neck.  CTA of the head and neck cannot be ordered due to elevated creatinine.  Will request MRI images from [facility] to review Advised patient to be compliant with his antiplatelet agents.  I had a long d/w patient and family about recent stroke, risk for recurrent stroke/TIAs, personally independently reviewed imaging studies and stroke evaluation results and answered questions.Continue ASA and Plavix for secondary stroke prevention and maintain strict control of hypertension with blood pressure goal below 130/90, diabetes with hemoglobin A1c goal below 6.5% and lipids with LDL cholesterol goal below 70 mg/dL. I also advised the patient to eat a healthy diet with plenty of whole grains, cereals, fruits and vegetables, exercise regularly and maintain ideal body weight .    Sarina Ill, MD  [facility] [address] [place], [place] [address]  Phone [phone] Fax [phone]

## 2017-05-17 ENCOUNTER — Telehealth: Payer: Self-pay | Admitting: *Deleted

## 2017-05-17 NOTE — Telephone Encounter (Addendum)
R/C notes from [facility], pt notes on [employer].

## 2017-05-23 NOTE — Telephone Encounter (Signed)
Received notes from Dr. Helene Kelp at [facility] with concern that pt had experienced a cerebellar stroke. MRI showed punctate acute infarct in the deep right cerebellum. Labs wnl except A1C 9.1 (H), RBC 3.94 (L), HGB 11.9 (L), HCT 35.7 (L), Glucose 192 (H), BUN 25 (H), Creatinine 1.56 (H), Total protein 5.9 (L), GFR 46 (L), Triglycerides 186 (H). Sent to med records for scanning, copy to Dr. Jaynee Eagles for review.

## 2017-06-11 DIAGNOSIS — M5416 Radiculopathy, lumbar region: Secondary | ICD-10-CM | POA: Diagnosis not present

## 2017-06-11 DIAGNOSIS — M5126 Other intervertebral disc displacement, lumbar region: Secondary | ICD-10-CM | POA: Diagnosis not present

## 2017-06-11 DIAGNOSIS — K8689 Other specified diseases of pancreas: Secondary | ICD-10-CM | POA: Diagnosis not present

## 2017-06-11 DIAGNOSIS — M48061 Spinal stenosis, lumbar region without neurogenic claudication: Secondary | ICD-10-CM | POA: Diagnosis not present

## 2017-06-14 DIAGNOSIS — M48062 Spinal stenosis, lumbar region with neurogenic claudication: Secondary | ICD-10-CM | POA: Diagnosis not present

## 2017-06-27 DIAGNOSIS — M48062 Spinal stenosis, lumbar region with neurogenic claudication: Secondary | ICD-10-CM | POA: Diagnosis not present

## 2017-06-27 DIAGNOSIS — M545 Low back pain: Secondary | ICD-10-CM | POA: Diagnosis not present

## 2017-07-26 DIAGNOSIS — B351 Tinea unguium: Secondary | ICD-10-CM | POA: Diagnosis not present

## 2017-07-26 DIAGNOSIS — E119 Type 2 diabetes mellitus without complications: Secondary | ICD-10-CM | POA: Diagnosis not present

## 2017-08-23 DIAGNOSIS — Z125 Encounter for screening for malignant neoplasm of prostate: Secondary | ICD-10-CM | POA: Diagnosis not present

## 2017-08-23 DIAGNOSIS — N401 Enlarged prostate with lower urinary tract symptoms: Secondary | ICD-10-CM | POA: Diagnosis not present

## 2017-08-23 DIAGNOSIS — C679 Malignant neoplasm of bladder, unspecified: Secondary | ICD-10-CM | POA: Diagnosis not present

## 2017-08-24 DIAGNOSIS — R8299 Other abnormal findings in urine: Secondary | ICD-10-CM | POA: Diagnosis not present

## 2017-09-13 DIAGNOSIS — C679 Malignant neoplasm of bladder, unspecified: Secondary | ICD-10-CM | POA: Diagnosis not present

## 2017-09-13 DIAGNOSIS — N401 Enlarged prostate with lower urinary tract symptoms: Secondary | ICD-10-CM | POA: Diagnosis not present

## 2017-10-10 DIAGNOSIS — M48062 Spinal stenosis, lumbar region with neurogenic claudication: Secondary | ICD-10-CM | POA: Diagnosis not present

## 2017-10-10 DIAGNOSIS — M47817 Spondylosis without myelopathy or radiculopathy, lumbosacral region: Secondary | ICD-10-CM | POA: Diagnosis not present

## 2017-10-27 DIAGNOSIS — E119 Type 2 diabetes mellitus without complications: Secondary | ICD-10-CM | POA: Diagnosis not present

## 2017-10-27 DIAGNOSIS — B351 Tinea unguium: Secondary | ICD-10-CM | POA: Diagnosis not present

## 2017-11-15 ENCOUNTER — Ambulatory Visit: Payer: Medicare Other | Admitting: Nurse Practitioner

## 2017-12-14 DIAGNOSIS — R413 Other amnesia: Secondary | ICD-10-CM | POA: Diagnosis not present

## 2017-12-14 DIAGNOSIS — E1165 Type 2 diabetes mellitus with hyperglycemia: Secondary | ICD-10-CM | POA: Diagnosis not present

## 2017-12-14 DIAGNOSIS — Z1339 Encounter for screening examination for other mental health and behavioral disorders: Secondary | ICD-10-CM | POA: Diagnosis not present

## 2017-12-14 DIAGNOSIS — I10 Essential (primary) hypertension: Secondary | ICD-10-CM | POA: Diagnosis not present

## 2018-01-23 DIAGNOSIS — I10 Essential (primary) hypertension: Secondary | ICD-10-CM | POA: Diagnosis not present

## 2018-01-23 DIAGNOSIS — E785 Hyperlipidemia, unspecified: Secondary | ICD-10-CM | POA: Diagnosis not present

## 2018-01-23 DIAGNOSIS — I2510 Atherosclerotic heart disease of native coronary artery without angina pectoris: Secondary | ICD-10-CM | POA: Diagnosis not present

## 2018-01-23 DIAGNOSIS — I639 Cerebral infarction, unspecified: Secondary | ICD-10-CM | POA: Diagnosis not present

## 2018-07-09 DIAGNOSIS — R51 Headache: Secondary | ICD-10-CM | POA: Diagnosis not present

## 2018-07-09 DIAGNOSIS — R0602 Shortness of breath: Secondary | ICD-10-CM | POA: Diagnosis not present

## 2018-07-09 DIAGNOSIS — N289 Disorder of kidney and ureter, unspecified: Secondary | ICD-10-CM | POA: Diagnosis not present

## 2018-07-09 DIAGNOSIS — R739 Hyperglycemia, unspecified: Secondary | ICD-10-CM | POA: Diagnosis not present

## 2018-07-10 DIAGNOSIS — G301 Alzheimer's disease with late onset: Secondary | ICD-10-CM | POA: Diagnosis not present

## 2018-07-10 DIAGNOSIS — S3991XA Unspecified injury of abdomen, initial encounter: Secondary | ICD-10-CM | POA: Diagnosis not present

## 2018-07-10 DIAGNOSIS — Z8551 Personal history of malignant neoplasm of bladder: Secondary | ICD-10-CM | POA: Diagnosis not present

## 2018-07-10 DIAGNOSIS — F0280 Dementia in other diseases classified elsewhere without behavioral disturbance: Secondary | ICD-10-CM | POA: Diagnosis not present

## 2018-07-10 DIAGNOSIS — E669 Obesity, unspecified: Secondary | ICD-10-CM | POA: Diagnosis not present

## 2018-07-10 DIAGNOSIS — I611 Nontraumatic intracerebral hemorrhage in hemisphere, cortical: Secondary | ICD-10-CM | POA: Diagnosis not present

## 2018-07-10 DIAGNOSIS — R51 Headache: Secondary | ICD-10-CM | POA: Diagnosis not present

## 2018-07-10 DIAGNOSIS — I6330 Cerebral infarction due to thrombosis of unspecified cerebral artery: Secondary | ICD-10-CM | POA: Diagnosis not present

## 2018-07-10 DIAGNOSIS — R6510 Systemic inflammatory response syndrome (SIRS) of non-infectious origin without acute organ dysfunction: Secondary | ICD-10-CM | POA: Diagnosis not present

## 2018-07-10 DIAGNOSIS — I471 Supraventricular tachycardia: Secondary | ICD-10-CM | POA: Diagnosis not present

## 2018-07-10 DIAGNOSIS — R410 Disorientation, unspecified: Secondary | ICD-10-CM | POA: Diagnosis not present

## 2018-07-10 DIAGNOSIS — S299XXA Unspecified injury of thorax, initial encounter: Secondary | ICD-10-CM | POA: Diagnosis not present

## 2018-07-10 DIAGNOSIS — E871 Hypo-osmolality and hyponatremia: Secondary | ICD-10-CM | POA: Diagnosis not present

## 2018-07-10 DIAGNOSIS — Z7902 Long term (current) use of antithrombotics/antiplatelets: Secondary | ICD-10-CM | POA: Diagnosis not present

## 2018-07-10 DIAGNOSIS — I2510 Atherosclerotic heart disease of native coronary artery without angina pectoris: Secondary | ICD-10-CM | POA: Diagnosis not present

## 2018-07-10 DIAGNOSIS — J309 Allergic rhinitis, unspecified: Secondary | ICD-10-CM | POA: Diagnosis not present

## 2018-07-10 DIAGNOSIS — R509 Fever, unspecified: Secondary | ICD-10-CM | POA: Diagnosis not present

## 2018-07-10 DIAGNOSIS — Z2821 Immunization not carried out because of patient refusal: Secondary | ICD-10-CM | POA: Diagnosis not present

## 2018-07-10 DIAGNOSIS — I679 Cerebrovascular disease, unspecified: Secondary | ICD-10-CM | POA: Diagnosis not present

## 2018-07-10 DIAGNOSIS — Z7982 Long term (current) use of aspirin: Secondary | ICD-10-CM | POA: Diagnosis not present

## 2018-07-10 DIAGNOSIS — I6523 Occlusion and stenosis of bilateral carotid arteries: Secondary | ICD-10-CM | POA: Diagnosis not present

## 2018-07-10 DIAGNOSIS — E1165 Type 2 diabetes mellitus with hyperglycemia: Secondary | ICD-10-CM | POA: Diagnosis not present

## 2018-07-10 DIAGNOSIS — Z79899 Other long term (current) drug therapy: Secondary | ICD-10-CM | POA: Diagnosis not present

## 2018-07-10 DIAGNOSIS — I472 Ventricular tachycardia: Secondary | ICD-10-CM | POA: Diagnosis not present

## 2018-07-10 DIAGNOSIS — F419 Anxiety disorder, unspecified: Secondary | ICD-10-CM | POA: Diagnosis not present

## 2018-07-10 DIAGNOSIS — R531 Weakness: Secondary | ICD-10-CM | POA: Diagnosis not present

## 2018-07-10 DIAGNOSIS — Z794 Long term (current) use of insulin: Secondary | ICD-10-CM | POA: Diagnosis not present

## 2018-07-10 DIAGNOSIS — I639 Cerebral infarction, unspecified: Secondary | ICD-10-CM | POA: Diagnosis not present

## 2018-07-10 DIAGNOSIS — E785 Hyperlipidemia, unspecified: Secondary | ICD-10-CM | POA: Diagnosis not present

## 2018-07-10 DIAGNOSIS — Z8673 Personal history of transient ischemic attack (TIA), and cerebral infarction without residual deficits: Secondary | ICD-10-CM | POA: Diagnosis not present

## 2018-07-10 DIAGNOSIS — R1310 Dysphagia, unspecified: Secondary | ICD-10-CM | POA: Diagnosis not present

## 2018-07-10 DIAGNOSIS — G309 Alzheimer's disease, unspecified: Secondary | ICD-10-CM | POA: Diagnosis not present

## 2018-07-10 DIAGNOSIS — E782 Mixed hyperlipidemia: Secondary | ICD-10-CM | POA: Diagnosis not present

## 2018-07-10 DIAGNOSIS — E1122 Type 2 diabetes mellitus with diabetic chronic kidney disease: Secondary | ICD-10-CM | POA: Diagnosis not present

## 2018-07-10 DIAGNOSIS — N289 Disorder of kidney and ureter, unspecified: Secondary | ICD-10-CM | POA: Diagnosis not present

## 2018-07-10 DIAGNOSIS — N390 Urinary tract infection, site not specified: Secondary | ICD-10-CM | POA: Diagnosis not present

## 2018-07-10 DIAGNOSIS — I482 Chronic atrial fibrillation: Secondary | ICD-10-CM | POA: Diagnosis not present

## 2018-07-10 DIAGNOSIS — K219 Gastro-esophageal reflux disease without esophagitis: Secondary | ICD-10-CM | POA: Diagnosis not present

## 2018-07-10 DIAGNOSIS — R0602 Shortness of breath: Secondary | ICD-10-CM | POA: Diagnosis not present

## 2018-07-10 DIAGNOSIS — I10 Essential (primary) hypertension: Secondary | ICD-10-CM | POA: Diagnosis not present

## 2018-07-10 DIAGNOSIS — R270 Ataxia, unspecified: Secondary | ICD-10-CM

## 2018-07-10 DIAGNOSIS — Z289 Immunization not carried out for unspecified reason: Secondary | ICD-10-CM | POA: Diagnosis not present

## 2018-07-10 DIAGNOSIS — Z66 Do not resuscitate: Secondary | ICD-10-CM | POA: Diagnosis not present

## 2018-07-10 DIAGNOSIS — B9689 Other specified bacterial agents as the cause of diseases classified elsewhere: Secondary | ICD-10-CM | POA: Diagnosis not present

## 2018-07-10 DIAGNOSIS — R2689 Other abnormalities of gait and mobility: Secondary | ICD-10-CM | POA: Diagnosis not present

## 2018-07-10 DIAGNOSIS — N183 Chronic kidney disease, stage 3 (moderate): Secondary | ICD-10-CM | POA: Diagnosis not present

## 2018-07-10 DIAGNOSIS — I252 Old myocardial infarction: Secondary | ICD-10-CM | POA: Diagnosis not present

## 2018-07-10 DIAGNOSIS — I4892 Unspecified atrial flutter: Secondary | ICD-10-CM | POA: Diagnosis not present

## 2018-07-10 DIAGNOSIS — R29700 NIHSS score 0: Secondary | ICD-10-CM | POA: Diagnosis not present

## 2018-07-10 DIAGNOSIS — E119 Type 2 diabetes mellitus without complications: Secondary | ICD-10-CM | POA: Diagnosis not present

## 2018-07-10 DIAGNOSIS — R29702 NIHSS score 2: Secondary | ICD-10-CM | POA: Diagnosis not present

## 2018-07-10 DIAGNOSIS — I129 Hypertensive chronic kidney disease with stage 1 through stage 4 chronic kidney disease, or unspecified chronic kidney disease: Secondary | ICD-10-CM | POA: Diagnosis not present

## 2018-07-10 DIAGNOSIS — G9341 Metabolic encephalopathy: Secondary | ICD-10-CM | POA: Diagnosis not present

## 2018-07-10 DIAGNOSIS — Z955 Presence of coronary angioplasty implant and graft: Secondary | ICD-10-CM | POA: Diagnosis not present

## 2018-07-10 DIAGNOSIS — I491 Atrial premature depolarization: Secondary | ICD-10-CM | POA: Diagnosis not present

## 2018-07-11 DIAGNOSIS — E119 Type 2 diabetes mellitus without complications: Secondary | ICD-10-CM

## 2018-07-13 DIAGNOSIS — I472 Ventricular tachycardia: Secondary | ICD-10-CM

## 2018-07-13 DIAGNOSIS — I2510 Atherosclerotic heart disease of native coronary artery without angina pectoris: Secondary | ICD-10-CM

## 2018-07-13 DIAGNOSIS — I639 Cerebral infarction, unspecified: Secondary | ICD-10-CM

## 2018-07-13 DIAGNOSIS — J309 Allergic rhinitis, unspecified: Secondary | ICD-10-CM

## 2018-07-14 ENCOUNTER — Encounter (HOSPITAL_COMMUNITY): Payer: Self-pay | Admitting: Family Medicine

## 2018-07-14 ENCOUNTER — Observation Stay (HOSPITAL_COMMUNITY)
Admission: AD | Admit: 2018-07-14 | Discharge: 2018-07-19 | Disposition: A | Discharge status: Skilled Nursing Facility | Payer: Medicare Other | Source: Other Acute Inpatient Hospital | Attending: Internal Medicine | Admitting: Internal Medicine

## 2018-07-14 DIAGNOSIS — R29702 NIHSS score 2: Secondary | ICD-10-CM | POA: Insufficient documentation

## 2018-07-14 DIAGNOSIS — E871 Hypo-osmolality and hyponatremia: Secondary | ICD-10-CM | POA: Diagnosis not present

## 2018-07-14 DIAGNOSIS — I482 Chronic atrial fibrillation: Secondary | ICD-10-CM | POA: Insufficient documentation

## 2018-07-14 DIAGNOSIS — N183 Chronic kidney disease, stage 3 unspecified: Secondary | ICD-10-CM

## 2018-07-14 DIAGNOSIS — I6330 Cerebral infarction due to thrombosis of unspecified cerebral artery: Secondary | ICD-10-CM | POA: Diagnosis not present

## 2018-07-14 DIAGNOSIS — R2689 Other abnormalities of gait and mobility: Secondary | ICD-10-CM | POA: Insufficient documentation

## 2018-07-14 DIAGNOSIS — I4892 Unspecified atrial flutter: Secondary | ICD-10-CM | POA: Insufficient documentation

## 2018-07-14 DIAGNOSIS — E1122 Type 2 diabetes mellitus with diabetic chronic kidney disease: Secondary | ICD-10-CM | POA: Diagnosis not present

## 2018-07-14 DIAGNOSIS — I129 Hypertensive chronic kidney disease with stage 1 through stage 4 chronic kidney disease, or unspecified chronic kidney disease: Secondary | ICD-10-CM | POA: Diagnosis not present

## 2018-07-14 DIAGNOSIS — E785 Hyperlipidemia, unspecified: Secondary | ICD-10-CM | POA: Insufficient documentation

## 2018-07-14 DIAGNOSIS — I6930 Unspecified sequelae of cerebral infarction: Secondary | ICD-10-CM

## 2018-07-14 DIAGNOSIS — Z7982 Long term (current) use of aspirin: Secondary | ICD-10-CM | POA: Diagnosis not present

## 2018-07-14 DIAGNOSIS — Z7984 Long term (current) use of oral hypoglycemic drugs: Secondary | ICD-10-CM | POA: Insufficient documentation

## 2018-07-14 DIAGNOSIS — Z7902 Long term (current) use of antithrombotics/antiplatelets: Secondary | ICD-10-CM | POA: Insufficient documentation

## 2018-07-14 DIAGNOSIS — I471 Supraventricular tachycardia, unspecified: Secondary | ICD-10-CM | POA: Diagnosis present

## 2018-07-14 DIAGNOSIS — I679 Cerebrovascular disease, unspecified: Secondary | ICD-10-CM | POA: Diagnosis present

## 2018-07-14 DIAGNOSIS — K219 Gastro-esophageal reflux disease without esophagitis: Secondary | ICD-10-CM | POA: Insufficient documentation

## 2018-07-14 DIAGNOSIS — E669 Obesity, unspecified: Secondary | ICD-10-CM | POA: Insufficient documentation

## 2018-07-14 DIAGNOSIS — F419 Anxiety disorder, unspecified: Secondary | ICD-10-CM | POA: Diagnosis present

## 2018-07-14 DIAGNOSIS — Z6832 Body mass index (BMI) 32.0-32.9, adult: Secondary | ICD-10-CM | POA: Insufficient documentation

## 2018-07-14 DIAGNOSIS — Z8673 Personal history of transient ischemic attack (TIA), and cerebral infarction without residual deficits: Secondary | ICD-10-CM

## 2018-07-14 DIAGNOSIS — I472 Ventricular tachycardia: Secondary | ICD-10-CM

## 2018-07-14 DIAGNOSIS — G309 Alzheimer's disease, unspecified: Secondary | ICD-10-CM | POA: Insufficient documentation

## 2018-07-14 DIAGNOSIS — E1169 Type 2 diabetes mellitus with other specified complication: Secondary | ICD-10-CM

## 2018-07-14 DIAGNOSIS — C679 Malignant neoplasm of bladder, unspecified: Secondary | ICD-10-CM

## 2018-07-14 DIAGNOSIS — F0390 Unspecified dementia without behavioral disturbance: Secondary | ICD-10-CM

## 2018-07-14 DIAGNOSIS — Z8551 Personal history of malignant neoplasm of bladder: Secondary | ICD-10-CM | POA: Diagnosis not present

## 2018-07-14 DIAGNOSIS — I2510 Atherosclerotic heart disease of native coronary artery without angina pectoris: Secondary | ICD-10-CM | POA: Diagnosis not present

## 2018-07-14 DIAGNOSIS — Z66 Do not resuscitate: Secondary | ICD-10-CM | POA: Insufficient documentation

## 2018-07-14 DIAGNOSIS — E119 Type 2 diabetes mellitus without complications: Secondary | ICD-10-CM

## 2018-07-14 DIAGNOSIS — R1310 Dysphagia, unspecified: Secondary | ICD-10-CM | POA: Diagnosis not present

## 2018-07-14 DIAGNOSIS — Z955 Presence of coronary angioplasty implant and graft: Secondary | ICD-10-CM | POA: Diagnosis not present

## 2018-07-14 DIAGNOSIS — F0280 Dementia in other diseases classified elsewhere without behavioral disturbance: Secondary | ICD-10-CM | POA: Insufficient documentation

## 2018-07-14 DIAGNOSIS — Z841 Family history of disorders of kidney and ureter: Secondary | ICD-10-CM | POA: Insufficient documentation

## 2018-07-14 DIAGNOSIS — Z8249 Family history of ischemic heart disease and other diseases of the circulatory system: Secondary | ICD-10-CM | POA: Insufficient documentation

## 2018-07-14 DIAGNOSIS — I10 Essential (primary) hypertension: Secondary | ICD-10-CM | POA: Diagnosis present

## 2018-07-14 LAB — GLUCOSE, CAPILLARY: Glucose-Capillary: 232 mg/dL — ABNORMAL HIGH (ref 70–99)

## 2018-07-14 MED ORDER — ACETAMINOPHEN 160 MG/5ML PO SOLN: 650.0000 mg | ORAL | Status: DC | PRN

## 2018-07-14 MED ORDER — WHITE PETROLATUM EX OINT
TOPICAL_OINTMENT | CUTANEOUS | Status: AC
  Administered 2018-07-14: 0.2
  Filled 2018-07-14: qty 28.35

## 2018-07-14 MED ORDER — ASPIRIN EC 81 MG PO TBEC
81.0000 mg | DELAYED_RELEASE_TABLET | Freq: Every day | ORAL | Status: DC
  Administered 2018-07-15 – 2018-07-19 (×5): 81 mg via ORAL
  Filled 2018-07-14 (×5): qty 1

## 2018-07-14 MED ORDER — INSULIN ASPART 100 UNIT/ML ~~LOC~~ SOLN
0.0000 [IU] | Freq: Three times a day (TID) | SUBCUTANEOUS | Status: DC
  Administered 2018-07-15: 5 [IU] via SUBCUTANEOUS
  Administered 2018-07-15: 9 [IU] via SUBCUTANEOUS
  Administered 2018-07-15 – 2018-07-16 (×2): 5 [IU] via SUBCUTANEOUS
  Administered 2018-07-16: 15 [IU] via SUBCUTANEOUS

## 2018-07-14 MED ORDER — STROKE: EARLY STAGES OF RECOVERY BOOK
Freq: Once | Status: AC
  Administered 2018-07-14: 22:00:00

## 2018-07-14 MED ORDER — ACETAMINOPHEN 325 MG PO TABS
650.0000 mg | ORAL_TABLET | ORAL | Status: DC | PRN
  Administered 2018-07-16 – 2018-07-18 (×5): 650 mg via ORAL
  Filled 2018-07-14 (×5): qty 2

## 2018-07-14 MED ORDER — ACETAMINOPHEN 650 MG RE SUPP
650.0000 mg | RECTAL | Status: DC | PRN
  Administered 2018-07-14: 650 mg via RECTAL
  Filled 2018-07-14: qty 1

## 2018-07-14 MED ORDER — CLOPIDOGREL BISULFATE 75 MG PO TABS: 75.0000 mg | ORAL_TABLET | Freq: Every day | ORAL | Status: DC

## 2018-07-14 NOTE — H&P (Signed)
History and Physical    Clayton Johnson RDE:[id number] DOB: 10-Oct-1934 DOA: 07/14/2018  PCP: Ronita Hipps, MD  Patient coming from: [facility]  Chief Complaint: Recent stroke  HPI: Clayton Johnson is a 84 y.o. male with medical history significant of coronary artery disease, hypertension, dyslipidemia, diabetes transferred here from [facility] for neurology evaluation.  Patient has had a full neurology work-up at [facility] showing acute CVA left right occipital with a flutter.  Patient has been getting physical therapy was in the process of being discharged to rehab apparently went into SVT at some point last night.  Currently rate is 70s.  His beta-blocker was increased today.  Patient transferred here due to patient family requesting to be seen by a neurologist face-to-face instead of through telemetry neurology.  Patient is only complaint is a headache at this point.  Review of Systems: As per HPI otherwise 10 point review of systems negative.   Past Medical History:  Diagnosis Date  . Anxiety disorder   . Bladder cancer ([place])   . Coronary artery disease   . Degenerative arthritis   . Diabetes ([place])   . Dyslipidemia   . GERD (gastroesophageal reflux disease)   . Hypertension   . Obesity   . TIA (transient ischemic attack)    MULTIPLE,BIHEMISPHERIC    Past Surgical History:  Procedure Laterality Date  . BLADDER REPAIR    . CORONARY STENT PLACEMENT    . NASAL SINUS SURGERY    . SHOULDER OPEN ROTATOR CUFF REPAIR Right   . TOTAL KNEE ARTHROPLASTY Right      reports that he has never smoked. He has never used smokeless tobacco. He reports that he does not drink alcohol or use drugs.  No Known Allergies  Family History  Problem Relation Age of Onset  . Heart disease Mother   . Stroke Father   . Renal Disease Sister   . Heart attack Brother     Prior to Admission medications   Medication Sig Start Date End Date Taking? Authorizing Provider  aspirin EC 81  MG tablet Take 81 mg by mouth.    [provider]  cetirizine (ZYRTEC) 10 MG tablet Take 10 mg by mouth daily.    [provider]  clopidogrel (PLAVIX) 75 MG tablet Take 75 mg by mouth daily. 03/22/13   [provider]  donepezil (ARICEPT) 5 MG tablet Take 5 mg by mouth.    [provider]  isosorbide mononitrate (IMDUR) 30 MG 24 hr tablet Take 30 mg by mouth daily. 02/28/13   [provider]  JANUMET XR 50-500 MG TB24 Take 50-500 mg by mouth 2 (two) times daily. 03/23/13   [provider]  metoprolol tartrate (LOPRESSOR) 25 MG tablet Take 25 mg by mouth 2 (two) times daily. 01/29/13   [provider]  pravastatin (PRAVACHOL) 40 MG tablet Take 40 mg by mouth daily. 01/03/13   [provider]  quinapril (ACCUPRIL) 40 MG tablet Take 40 mg by mouth daily. 02/26/13   [provider]    Physical Exam: Vitals:   07/14/18 2000  Weight: 96.5 kg  Height: 5\' 8"  (1.727 m)      Constitutional: NAD, calm, comfortable Vitals:   07/14/18 2000  Weight: 96.5 kg  Height: 5\' 8"  (1.727 m)   Eyes: PERRL, lids and conjunctivae normal ENMT: Mucous membranes are moist. Posterior pharynx clear of any exudate or lesions.Normal dentition.  Neck: normal, supple, no masses, no thyromegaly Respiratory: clear to  auscultation bilaterally, no wheezing, no crackles. Normal respiratory effort. No accessory muscle use.  Cardiovascular: Regular rate and rhythm, no murmurs / rubs / gallops. No extremity edema. 2+ pedal pulses. No carotid bruits.  Abdomen: no tenderness, no masses palpated. No hepatosplenomegaly. Bowel sounds positive.  Musculoskeletal: no clubbing / cyanosis. No joint deformity upper and lower extremities. Good ROM, no contractures. Normal muscle tone.  Skin: no rashes, lesions, ulcers. No induration Neurologic: CN 2-12 grossly intact. Sensation intact, DTR normal. Strength 5/5 in all 4.  Psychiatric: Normal judgment and insight.  Alert and oriented x 3. Normal mood.    Labs on Admission: I have personally reviewed following labs and imaging studies  CBC: No results for input(s): WBC, NEUTROABS, HGB, HCT, MCV, PLT in the last 168 hours. Basic Metabolic Panel: No results for input(s): NA, K, CL, CO2, GLUCOSE, BUN, CREATININE, CALCIUM, MG, PHOS in the last 168 hours. GFR: CrCl cannot be calculated (Patient's most recent lab result is older than the maximum 21 days allowed.). Liver Function Tests: No results for input(s): AST, ALT, ALKPHOS, BILITOT, PROT, ALBUMIN in the last 168 hours. No results for input(s): LIPASE, AMYLASE in the last 168 hours. No results for input(s): AMMONIA in the last 168 hours. Coagulation Profile: No results for input(s): INR, PROTIME in the last 168 hours. Cardiac Enzymes: No results for input(s): CKTOTAL, CKMB, CKMBINDEX, TROPONINI in the last 168 hours. BNP (last 3 results) No results for input(s): PROBNP in the last 8760 hours. HbA1C: No results for input(s): HGBA1C in the last 72 hours. CBG: No results for input(s): GLUCAP in the last 168 hours. Lipid Profile: No results for input(s): CHOL, HDL, LDLCALC, TRIG, CHOLHDL, LDLDIRECT in the last 72 hours. Thyroid Function Tests: No results for input(s): TSH, T4TOTAL, FREET4, T3FREE, THYROIDAB in the last 72 hours. Anemia Panel: No results for input(s): VITAMINB12, FOLATE, FERRITIN, TIBC, IRON, RETICCTPCT in the last 72 hours. Urine analysis:    Component Value Date/Time   COLORURINE YELLOW 10/09/2007 1410   APPEARANCEUR CLEAR 10/09/2007 1410   LABSPEC 1.013 10/09/2007 1410   PHURINE 5.5 10/09/2007 1410   GLUCOSEU >1000 (A) 10/09/2007 1410   HGBUR NEGATIVE 10/09/2007 1410   BILIRUBINUR NEGATIVE 10/09/2007 1410   KETONESUR NEGATIVE 10/09/2007 1410   PROTEINUR NEGATIVE 10/09/2007 1410   UROBILINOGEN 0.2 10/09/2007 1410   NITRITE NEGATIVE 10/09/2007 1410   LEUKOCYTESUR NEGATIVE 10/09/2007 1410   Sepsis Labs:  !!!!!!!!!!!!!!!!!!!!!!!!!!!!!!!!!!!!!!!!!!!! @LABRCNTIP (procalcitonin:4,lacticidven:4) )No results found for this or any previous visit (from the past 240 hour(s)).   Radiological Exams on Admission: No results found.  EKG: Independently reviewed.  Normal sinus rhythm currently Chart review from [facility]  Per [place] note from Dr. Derryl Harbor  Brief narrative: Patient is a 84 year old M with past medical history significant for coronary disease, hypertension, dementia, diabetes, GERD, arthritis, and history of small stroke who presented to the emergency department on 07/10/2018 for the 2nd time in the last 2 days for concerns of altered mental status. Last known normal was sometime on 07/09/2018. Upon admission patient found to be slightly altered and afebrile with a temperature of 102 and slightly tachypneic with respiratory rate of 22 median SIRS criteria.  Hospital services consulted and admitted further workup.  During hospitalization, patient was confirmed have right occipital lobe large CVA, with possible hemolytic transformation. he also found on telemetry proximal atrial flutter/atrial fibrillation. Neurology recommends aspirin now, can start anticoagulation 2 weeks from CVA will be 8/26. PT/OT rec SNF. Patient has multiple episode of NSVT, cardiology is consulted,  rec increassed BB, close monitoring, if NSVT controlled, need outpatient for Zio event monitor to confirm aflutter/afib.  [place], patient family want transfer patient to [facility] given not real neurology here and also need cardiology consult. d/w on call neurologist, hospitalist group will accept patient, neurologist will be consult. also need cardiology consult for NSVT and aflutter/afib.  See below for further details. Physical exam: General appearance :mild distress Eyes:pupils equally reactive light,no scleral icterus. HEENT: Atraumatic and Normocephalic Neck: supple, no JVD. No cervical lymphadenopathy.  Resp:Good air entry  bilaterally, no wheezing CVS: S1 S2 , no murmurs.  GI: Bowel sounds present, Non tender and not distended with no gaurding, rigidity or rebound.  Extremities: B/L Lower Ext shows no edema, both legs are warm to touch Neurology: CN 2-12 intact except left-sided vision defect. No motor deficit. sensation is grossly intact. Psychiatric: Normal judgment and insight Skin:No Rash, warm and dry  Assessment and plan: Acute ischemic right occipital lobe cerebrovascular accident with possible conversion to hemorrhagic:  On contrasted head CT ordered upon admission revealed acute right occipital stroke, however CT scan from day prior on 07/09/2018 was negative.  Home medication includes aspirin and Plavix. Last known normal was sometime on 07/09/2018. -MRI obtained 07/11/18 with findings of an acute hemorrhagic transformation to the right occipital lobe with moderate amounts of blood present in infarct. However which not show on CT 8/13.  -tele neuro consult called, patient not a candidate for tPA, etiology likely 2/2 aflutter/afib, cardiology rec Zio event monitoring. see below. -obtain 2D echo shows EF more than 55%, bubble study negative -PT/OT/SLP evals rec SNF -Neuro checks q4, NIH q shift -Continuos telemetry  -Lipid panel: Triglycerides elevated at 197 -Stop Plavix, and lovenox, on low-dose aspirin per Neurology Neurology recommends aspirin now, can start anticoagulation 2 weeks from CVA will be 8/26 NSVT: etiology unclear. cards on  likely related to CVA.  BB dose increased. close monitoring Aflutter/afib: e/o tele strip. not on 12 EKG on metoprolol cards rec Zio event monitor outpatient.  Neurology recommends aspirin now, can start anticoagulation 2 weeks from CVA will be 8/26 Systemic inflammatory response syndrome: As evidence by fever of 102 respiratory rate of 20  Likely 2/2 CVA, no sign of overt stroke -blood cultures NGTD, urine culture no growth. -DC IV Rocephin and IV vancomycin  given endocarditis unlikely.  -CT chest/abdomen/pelvis negative for any acute abnormalities -continue to monitor for signs of volume depletion monitor blood pressure closely -obtained 2D echocardiogram , shows EF more than 55%, bubble study negative Hyponatremia: Resolved Sodium 130 on admission. -133-135  Type 2 diabetes: Home medications include lantus and humalog -SSI with ACHS blood sugar checks -continued long at acting insulin at lower dose  Hypertension, essential: Home medications include Norvac, Lopressor, and Accupril -continue to monitor closely Mixed lipidemia: -Lipid panel: Triglycerides elevated at 197 -continue home medications -home statin increased due to acute CVA Coronary disease: Patient continues to deny any acute chest pain -continue home medications Dementia the Alzheimer's type: Continue home Aricept melatonin ordered q.h.s.  DVT prophylaxis: SCD Code status: Full code Family Communication: None at bedside Disposition: PT recommend SNF. Case manager on case Consultants: Tele neurology Microbiological data: Blood cultures pending Procedures: None  Radiological studies: CT ANGIOGRAPHY CHEST AND CT ABDOMEN AND PELVIS WITH CONTRAST CTA of the chest: Somewhat limited by patient motion artifact. No evidence of pulmonary emboli. Atherosclerotic changes without aneurysmal dilatation. CT of the abdomen and pelvis. Changes of prior granulomatous disease within the spleen. Chronic changes as  described without acute abnormality. Head CT, no contrast: IMPRESSION: 1. Acute nonhemorrhagic cortical stroke involving the RIGHT occipital lobe. 2. Remote lacunar strokes involving the RIGHT basal ganglia. 3. Stable moderate to severe generalized atrophy and severe chronic microvascular ischemic changes of the white matter MRI head Acute hemorrhagic infarct right occipital lobe. Moderate amount of blood is present in the infarct, not clearly visual on recent  CT Atrophy and chronic microvascular ischemic changes, moderate to advanced. Image quality degraded by significant motion.    Assessment/Plan 84 year old male with acute CVA history of diabetes, hypertension, hyperlipidemia Principal Problem:   Cerebrovascular disease, unspecified-acute nonhemorrhagic stroke in the right occipital lobe.  Continue with aspirin Plavix per telemetry neurology recommendations at [facility].  Patient does have hemorrhagic evidence on MRI of head.  Neurology already made aware of consult will notify them of patient arrival to [facility].  Physical therapy evaluation.  Will need rehab.  Active Problems:   Diabetes (HCC)-sliding scale insulin    Hypertension-clarify meds at [facility] and resume    Anxiety disorder-noted    Paroxysmal SVT (supraventricular tachycardia) (HCC)-patient's beta-blocker was increased today.  Continue monitoring on telemetry monitoring.   Med rec is pending pharmacy review   DVT prophylaxis: SCDs Code Status: Full Family Communication: Multiple children Disposition Plan: Per rehab Consults called: Neurology Admission status: Observation   DAVID,RACHAL A MD [facility]  If 7PM-7AM, please contact night-coverage [email or web address] Password [facility]  07/14/2018, 8:49 PM

## 2018-07-14 NOTE — Progress Notes (Signed)
Pt admitted from [facility], alert to self and place, follows simple command, c/o of headache, settled in room with call light and family at bedside, tele monitor put and verified on pt, safety concern addressed accordingly, was however reassured and will continue to monitor. Obasogie-Asidi, Vincenzina Jagoda Efe

## 2018-07-15 ENCOUNTER — Encounter (HOSPITAL_COMMUNITY): Payer: Self-pay | Admitting: *Deleted

## 2018-07-15 ENCOUNTER — Observation Stay (HOSPITAL_COMMUNITY): Payer: Medicare Other

## 2018-07-15 ENCOUNTER — Other Ambulatory Visit: Payer: Self-pay

## 2018-07-15 DIAGNOSIS — K219 Gastro-esophageal reflux disease without esophagitis: Secondary | ICD-10-CM | POA: Diagnosis not present

## 2018-07-15 DIAGNOSIS — I639 Cerebral infarction, unspecified: Secondary | ICD-10-CM | POA: Diagnosis not present

## 2018-07-15 DIAGNOSIS — I6330 Cerebral infarction due to thrombosis of unspecified cerebral artery: Secondary | ICD-10-CM | POA: Diagnosis not present

## 2018-07-15 DIAGNOSIS — I471 Supraventricular tachycardia: Secondary | ICD-10-CM

## 2018-07-15 DIAGNOSIS — I129 Hypertensive chronic kidney disease with stage 1 through stage 4 chronic kidney disease, or unspecified chronic kidney disease: Secondary | ICD-10-CM | POA: Diagnosis not present

## 2018-07-15 DIAGNOSIS — I679 Cerebrovascular disease, unspecified: Secondary | ICD-10-CM | POA: Diagnosis not present

## 2018-07-15 DIAGNOSIS — Z8551 Personal history of malignant neoplasm of bladder: Secondary | ICD-10-CM | POA: Diagnosis not present

## 2018-07-15 DIAGNOSIS — R1310 Dysphagia, unspecified: Secondary | ICD-10-CM | POA: Diagnosis not present

## 2018-07-15 DIAGNOSIS — I482 Chronic atrial fibrillation: Secondary | ICD-10-CM | POA: Diagnosis not present

## 2018-07-15 DIAGNOSIS — Z7902 Long term (current) use of antithrombotics/antiplatelets: Secondary | ICD-10-CM | POA: Diagnosis not present

## 2018-07-15 DIAGNOSIS — I2510 Atherosclerotic heart disease of native coronary artery without angina pectoris: Secondary | ICD-10-CM | POA: Diagnosis not present

## 2018-07-15 DIAGNOSIS — Z8673 Personal history of transient ischemic attack (TIA), and cerebral infarction without residual deficits: Secondary | ICD-10-CM | POA: Diagnosis not present

## 2018-07-15 DIAGNOSIS — Z66 Do not resuscitate: Secondary | ICD-10-CM | POA: Diagnosis not present

## 2018-07-15 DIAGNOSIS — Z955 Presence of coronary angioplasty implant and graft: Secondary | ICD-10-CM | POA: Diagnosis not present

## 2018-07-15 DIAGNOSIS — N183 Chronic kidney disease, stage 3 (moderate): Secondary | ICD-10-CM | POA: Diagnosis not present

## 2018-07-15 DIAGNOSIS — E785 Hyperlipidemia, unspecified: Secondary | ICD-10-CM | POA: Diagnosis not present

## 2018-07-15 DIAGNOSIS — E1169 Type 2 diabetes mellitus with other specified complication: Secondary | ICD-10-CM | POA: Diagnosis not present

## 2018-07-15 DIAGNOSIS — E1122 Type 2 diabetes mellitus with diabetic chronic kidney disease: Secondary | ICD-10-CM | POA: Diagnosis not present

## 2018-07-15 DIAGNOSIS — I4892 Unspecified atrial flutter: Secondary | ICD-10-CM | POA: Diagnosis not present

## 2018-07-15 DIAGNOSIS — Z7982 Long term (current) use of aspirin: Secondary | ICD-10-CM | POA: Diagnosis not present

## 2018-07-15 DIAGNOSIS — E871 Hypo-osmolality and hyponatremia: Secondary | ICD-10-CM | POA: Diagnosis not present

## 2018-07-15 DIAGNOSIS — G309 Alzheimer's disease, unspecified: Secondary | ICD-10-CM | POA: Diagnosis not present

## 2018-07-15 DIAGNOSIS — I10 Essential (primary) hypertension: Secondary | ICD-10-CM | POA: Diagnosis not present

## 2018-07-15 DIAGNOSIS — R29702 NIHSS score 2: Secondary | ICD-10-CM | POA: Diagnosis not present

## 2018-07-15 DIAGNOSIS — R2689 Other abnormalities of gait and mobility: Secondary | ICD-10-CM | POA: Diagnosis not present

## 2018-07-15 LAB — LIPID PANEL
Cholesterol: 129 mg/dL (ref 0–200)
HDL: 43 mg/dL (ref >40)
LDL CALC: 60 mg/dL (ref 0–99)
Total CHOL/HDL Ratio: 3 RATIO
Triglycerides: 130 mg/dL (ref <150)
VLDL: 26 mg/dL (ref 0–40)

## 2018-07-15 LAB — GLUCOSE, CAPILLARY
GLUCOSE-CAPILLARY: 272 mg/dL — AB (ref 70–99)
Glucose-Capillary: 263 mg/dL — ABNORMAL HIGH (ref 70–99)
Glucose-Capillary: 269 mg/dL — ABNORMAL HIGH (ref 70–99)
Glucose-Capillary: 373 mg/dL — ABNORMAL HIGH (ref 70–99)

## 2018-07-15 LAB — MAGNESIUM: Magnesium: 2 mg/dL (ref 1.7–2.4)

## 2018-07-15 LAB — HEMOGLOBIN A1C
Hgb A1c MFr Bld: 11.3 % — ABNORMAL HIGH (ref 4.8–5.6)
MEAN PLASMA GLUCOSE: 277.61 mg/dL

## 2018-07-15 MED ORDER — LABETALOL HCL 5 MG/ML IV SOLN: 5.0000 mg | INTRAVENOUS | Status: DC | PRN

## 2018-07-15 MED ORDER — PRAVASTATIN SODIUM 20 MG PO TABS
10.0000 mg | ORAL_TABLET | Freq: Every day | ORAL | Status: DC
  Administered 2018-07-15 – 2018-07-19 (×5): 10 mg via ORAL
  Filled 2018-07-15 (×5): qty 1

## 2018-07-15 MED ORDER — FINASTERIDE 5 MG PO TABS
5.0000 mg | ORAL_TABLET | Freq: Every day | ORAL | Status: DC
  Administered 2018-07-15 – 2018-07-18 (×4): 5 mg via ORAL
  Filled 2018-07-15 (×4): qty 1

## 2018-07-15 MED ORDER — GUAIFENESIN ER 600 MG PO TB12
600.0000 mg | ORAL_TABLET | Freq: Two times a day (BID) | ORAL | Status: DC | PRN
  Administered 2018-07-15 – 2018-07-18 (×5): 600 mg via ORAL
  Filled 2018-07-15 (×5): qty 1

## 2018-07-15 MED ORDER — METOPROLOL TARTRATE 5 MG/5ML IV SOLN: 5.0000 mg | Freq: Four times a day (QID) | INTRAVENOUS | Status: DC

## 2018-07-15 MED ORDER — FLUTICASONE PROPIONATE 50 MCG/ACT NA SUSP
2.0000 | Freq: Every day | NASAL | Status: DC
  Administered 2018-07-15 – 2018-07-19 (×5): 2 via NASAL
  Filled 2018-07-15: qty 16

## 2018-07-15 MED ORDER — TAMSULOSIN HCL 0.4 MG PO CAPS
0.4000 mg | ORAL_CAPSULE | Freq: Every day | ORAL | Status: DC
  Administered 2018-07-15 – 2018-07-18 (×4): 0.4 mg via ORAL
  Filled 2018-07-15 (×4): qty 1

## 2018-07-15 MED ORDER — METOPROLOL TARTRATE 25 MG PO TABS
25.0000 mg | ORAL_TABLET | Freq: Two times a day (BID) | ORAL | Status: DC
  Administered 2018-07-15 – 2018-07-19 (×9): 25 mg via ORAL
  Filled 2018-07-15 (×9): qty 1

## 2018-07-15 MED ORDER — DIPHENHYDRAMINE-ZINC ACETATE 2-0.1 % EX CREA
TOPICAL_CREAM | Freq: Two times a day (BID) | CUTANEOUS | Status: DC | PRN
  Administered 2018-07-15: 16:00:00 via TOPICAL
  Filled 2018-07-15: qty 28

## 2018-07-15 NOTE — Progress Notes (Addendum)
STROKE TEAM PROGRESS NOTE   HISTORY OF PRESENT ILLNESS (per record) Clayton Johnson is an 84 y.o. male past medical history dementia, diabetes, hypertension, bladder cancer, TIA, obesity who transferred from [facility] for management of atrial flutter and family request for an opinion with a neurologist for management of right PCA stroke.  Has known normal was on 8-11, patient developed headache and blurred vision.  Is apparently sent back from the ER.  Presented again on 812 with altered mental status as well as fever.  Work-up revealed a right PCA stroke, however there was hemorrhagic transformation.  He was evaluated by tele-neurology who recommended anticoagulation after 2 weeks as he was noted to be in atrial fibrillation/ A-flutter on telemetry(.  This is confirmed and patient was planned to have outpatient ZIO monitor-Per note.)  Family was not happy that he did not have a face-to-face encounter with the neurologist and requested transfer to [facility].  Date last known well: 8.11.19 tPA Given: no, outside window NIHSS: 2 Baseline MRS 0   SUBJECTIVE (INTERVAL HISTORY)     OBJECTIVE Vitals:   07/16/18 0032 07/16/18 0307 07/16/18 0821 07/16/18 1238  BP: (!) 140/103 (!) 142/75 (!) 155/78 (!) 134/55  Pulse: (!) 129 (!) 55 (!) 47 (!) 43  Resp: (!) 22 18 20 18   Temp: 98.8 F (37.1 C) 98.2 F (36.8 C) 98.7 F (37.1 C) 97.7 F (36.5 C)  TempSrc: Oral Oral Oral Oral  SpO2: 95% 92% 94% 97%  Weight:      Height:        CBC: No results for input(s): WBC, NEUTROABS, HGB, HCT, MCV, PLT in the last 168 hours.  Basic Metabolic Panel:  Recent Labs  Lab 07/15/18 0456 07/16/18 0332  NA  --  139  K  --  3.9  CL  --  105  CO2  --  23  GLUCOSE  --  297*  BUN  --  20  CREATININE  --  1.55*  CALCIUM  --  8.7*  MG 2.0 2.1    Lipid Panel:     Component Value Date/Time   CHOL 129 07/15/2018 0456   TRIG 130 07/15/2018 0456   HDL 43 07/15/2018 0456   CHOLHDL  3.0 07/15/2018 0456   VLDL 26 07/15/2018 0456   LDLCALC 60 07/15/2018 0456   HgbA1c:  Lab Results  Component Value Date   HGBA1C 11.3 (H) 07/15/2018   Urine Drug Screen: No results found for: LABOPIA, COCAINSCRNUR, LABBENZ, AMPHETMU, THCU, LABBARB  Alcohol Level No results found for: ETH  IMAGING   Ct Head Wo Contrast 07/15/2018 IMPRESSION:  1. Continued interval evolution of subacute hemorrhagic right occipital lobe infarct. Mild localized edema without significant regional mass effect. Fairly mild hemorrhage visible by CT, better appreciated on recent MRI. No other complication.  2. No other new acute intracranial abnormality.  3. Atrophy with advanced chronic microvascular ischemic disease.        PHYSICAL EXAM Vitals:   07/16/18 0032 07/16/18 0307 07/16/18 0821 07/16/18 1238  BP: (!) 140/103 (!) 142/75 (!) 155/78 (!) 134/55  Pulse: (!) 129 (!) 55 (!) 47 (!) 43  Resp: (!) 22 18 20 18   Temp: 98.8 F (37.1 C) 98.2 F (36.8 C) 98.7 F (37.1 C) 97.7 F (36.5 C)  TempSrc: Oral Oral Oral Oral  SpO2: 95% 92% 94% 97%  Weight:      Height:             ASSESSMENT/PLAN Mr.  Clayton Johnson is a 84 y.o. male with history of previous TIA, obesity, dementia, hypertension, dyslipidemia, diabetes mellitus, coronary artery disease, bladder cancer, anxiety disorder and newly diagnosed atrial flutter transferred from an outside hospital with a right PCA infarct with hemorrhagic transformation. He did not receive IV t-PA due to hemorrhagic transformation.  Stroke:  Right PCA -likely embolic secondary to atrial flutter.  Resultant    CT head - Continued interval evolution of subacute hemorrhagic right occipital lobe infarct. Mild localized edema without significant regional mass effect.  MRI head - OSH  MRA head - not performed  Carotid Doppler - not performed  2D Echo - not ordered  LDL - 60  HgbA1c - 11.3  VTE prophylaxis - SCDs  Diet - Dysphagia 3 with thin  liquids  aspirin 81 mg daily and clopidogrel 75 mg daily prior to admission, now on aspirin 81 mg daily  Patient counseled to be compliant with his antithrombotic medications  Ongoing aggressive stroke risk factor management  Therapy recommendations:  SNF  Disposition:  Pending  Hypertension  Stable . Permissive hypertension (OK if < 220/120) but gradually normalize in 5-7 days . Long-term BP goal normotensive  Hyperlipidemia  Lipid lowering medication PTA: Pravachol 10 mg daily  LDL 60, goal < 70  Current lipid lowering medication: Pravachol 10 mg daily  Continue statin at discharge  Diabetes  HgbA1c 11.3, goal < 7.0  Uncontrolled  Other Stroke Risk Factors  Advanced age  Obesity, Body mass index is 32.35 kg/m., recommend weight loss, diet and exercise as appropriate   Hx stroke/TIA  Family hx stroke (father)  Coronary artery disease   Other Active Problems  Dementia  Newly diagnosed atrial flutter   PLAN - per Dr Cristobal Goldmann R occipital stroke likely 2/2 Atrial flutter with petechial hemorrhagic conversion - workup completed at [facility] - repeat CTHead: stable hemorrhage - D/C plavix, unclear reason for dual therapy with ASA unless it was from a cardiology standpoint, would clarify with cardiology. - Continue ASA alone for now - start Eliquis on 8/25 ( 14 days after his stroke) if Afib/Aflutter is confirmed, if not needs event monitor. As per notes looks like he has had a monitor that confirmed flutter/Afib in the past. Would consult cardiology to clarify and confirm and if not will need to be evaluated for a loop recorder. - Please call back with any questions    Lowry Ram [facility] Pager [phone] 07/23/2018, 4:46 PM   Hospital day # 1    To contact Stroke Continuity provider, please refer to [email or web address] After hours, contact General Neurology

## 2018-07-15 NOTE — Evaluation (Signed)
Physical Therapy Evaluation Patient Details Name: Clayton Johnson MRN: [id number] DOB: 09/27/1934 Today's Date: 07/15/2018   History of Present Illness  Clayton Johnson is an 84 y.o. male past medical history dementia, diabetes, hypertension, bladder cancer, TIA, obesity who transferred from [facility] for management of atrial flutter and family request for an opinion with a neurologist for management of right PCA stroke.  Clinical Impression  Pt admitted with above diagnosis. Pt currently with functional limitations due to the deficits listed below (see PT Problem List). Pt initially lethargic and confused when more aroused. Pt with L gaze preference and visual deficits. Pt needed mod A to ambulate with RW and was running into obstacles R>L with great difficulty correcting. Pt not safe for home environment alone, family agreeable to SNF and states that they had a bed at [facility] but don't know if it's been held.   Pt will benefit from skilled PT to increase their independence and safety with mobility to allow discharge to the venue listed below.       Follow Up Recommendations SNF;Supervision/Assistance - 24 hour    Equipment Recommendations  None recommended by PT    Recommendations for Other Services       Precautions / Restrictions Precautions Precautions: Fall Restrictions Weight Bearing Restrictions: No      Mobility  Bed Mobility Overal bed mobility: Needs Assistance Bed Mobility: Supine to Sit     Supine to sit: Mod assist     General bed mobility comments: mod A for LE's off bed and elevation of trunk. Pt very insecure with initial movement  Transfers Overall transfer level: Needs assistance Equipment used: Rolling walker (2 wheeled);1 person hand held assist Transfers: Sit to/from Stand Sit to Stand: Min assist         General transfer comment: min A needed with and without use of RW, pt with mild posterior lean  Ambulation/Gait Ambulation/Gait  assistance: Mod assist Gait Distance (Feet): 100 Feet Assistive device: Rolling walker (2 wheeled);1 person hand held assist Gait Pattern/deviations: Step-through pattern;Drifts right/left Gait velocity: decreased Gait velocity interpretation: <1.31 ft/sec, indicative of household ambulator General Gait Details: pt running into objects on R>L, has difficulty correcting, prefers to try to push things out of his way. Pt able to maintain standing but having a lot of trouble navigating even in open hallway. Tried HHA without RW because he didn't use one before and this was also difficult for him when he didn't see where he was going but as soon as he grasped the chair in his focus he continued to ambulate to it with only min A needed.   Stairs            Wheelchair Mobility    Modified Rankin (Stroke Patients Only) Modified Rankin (Stroke Patients Only) Pre-Morbid Rankin Score: No symptoms Modified Rankin: Moderately severe disability     Balance Overall balance assessment: Needs assistance Sitting-balance support: No upper extremity supported;Feet supported Sitting balance-Leahy Scale: Fair Sitting balance - Comments: initial R lean, but improved with time and UE activities in sitting crossing midline.  Postural control: Right lateral lean;Posterior lean Standing balance support: Bilateral upper extremity supported Standing balance-Leahy Scale: Poor Standing balance comment: requires UE support to stand                             Pertinent Vitals/Pain Pain Assessment: Faces Faces Pain Scale: Hurts even more Pain Location: forehead HA Pain Descriptors / Indicators:  Headache Pain Intervention(s): Limited activity within patient's tolerance;Monitored during session    [place] expects to be discharged to:: Skilled nursing facility Living Arrangements: Alone Available Help at Discharge: Family;Available PRN/intermittently Type of Home: House                 Prior Function Level of Independence: Independent         Comments: pt lived alone, drives, had some mild dementia     Hand Dominance   Dominant Hand: Right    Extremity/Trunk Assessment   Upper Extremity Assessment Upper Extremity Assessment: Defer to OT evaluation    Lower Extremity Assessment Lower Extremity Assessment: LLE deficits/detail LLE Deficits / Details: knee ext 4/5, knee flex 4/5, hip flex 4/5 (weaker than R side but not by much) LLE Sensation: (difficult to assess due to cognition) LLE Coordination: decreased fine motor;decreased gross motor    Cervical / Trunk Assessment Cervical / Trunk Assessment: Normal  Communication   Communication: Other (comment)(confused language)  Cognition Arousal/Alertness: Lethargic Behavior During Therapy: WFL for tasks assessed/performed Overall Cognitive Status: Impaired/Different from baseline Area of Impairment: Orientation;Memory;Following commands;Safety/judgement;Problem solving                 Orientation Level: Disoriented to;Time;Situation;Place   Memory: Decreased short-term memory Following Commands: Follows one step commands inconsistently Safety/Judgement: Decreased awareness of safety;Decreased awareness of deficits   Problem Solving: Slow processing;Difficulty sequencing;Requires verbal cues;Requires tactile cues General Comments: pt talking about going down to the pond. He did have some appropriate speech but poor insight into deficits and safety.       General Comments General comments (skin integrity, edema, etc.):  L gaze preference but crosses midline when cued but even when he turns head to R, has difficulty identifying what he is seeing on that side. seems to have R visual field cut but also has some untreated cataracts on R eye. 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     Exercises     Assessment/Plan    PT Assessment Patient needs continued PT services  PT Problem List  Decreased strength;Decreased activity tolerance;Decreased balance;Decreased mobility;Decreased coordination;Decreased cognition;Decreased knowledge of use of DME;Decreased safety awareness;Decreased knowledge of precautions;Pain       PT Treatment Interventions DME instruction;Gait training;Functional mobility training;Therapeutic activities;Therapeutic exercise;Balance training;Neuromuscular re-education;Cognitive remediation;Patient/family education    PT Goals (Current goals can be found in the Care Plan section)  Acute Rehab PT Goals Patient Stated Goal: none stated, pt confused PT Goal Formulation: With patient/family Time For Goal Achievement: 07/29/18 Potential to Achieve Goals: Good    Frequency Min 3X/week   Barriers to discharge Decreased caregiver support      Co-evaluation               AM-PAC PT "6 Clicks" Daily Activity  Outcome Measure Difficulty turning over in bed (including adjusting bedclothes, sheets and blankets)?: Unable Difficulty moving from lying on back to sitting on the side of the bed? : Unable Difficulty sitting down on and standing up from a chair with arms (e.g., wheelchair, bedside commode, etc,.)?: Unable Help needed moving to and from a bed to chair (including a wheelchair)?: A Little Help needed walking in hospital room?: A Lot Help needed climbing 3-5 steps with a railing? : Total 6 Click Score: 9    End of Session Equipment Utilized During Treatment: Gait belt Activity Tolerance: Patient tolerated treatment well Patient left: in chair;with call bell/phone within reach;with chair alarm set;with family/visitor present Nurse Communication: Mobility status PT  Visit Diagnosis: Unsteadiness on feet (R26.81);Other abnormalities of gait and mobility (R26.89);Muscle weakness (generalized) (M62.81);Pain Pain - part of body: (head)    Time: 4818-5631 PT Time Calculation (min) (ACUTE ONLY): 49 min   Charges:   PT Evaluation $PT Eval Moderate  Complexity: 1 Mod PT Treatments $Gait Training: 8-22 mins $Therapeutic Activity: 8-22 mins        Leighton Roach, PT  Acute Rehab Services  [place] 07/15/2018, 12:05 PM

## 2018-07-15 NOTE — Evaluation (Signed)
Clinical/Bedside Swallow Evaluation Patient Details  Name: Clayton Johnson MRN: [id number] Date of Birth: [birth year]  Today's Date: 07/15/2018 Time: SLP Start Time (ACUTE ONLY): 3235 SLP Stop Time (ACUTE ONLY): 0930 SLP Time Calculation (min) (ACUTE ONLY): 13 min  Past Medical History:  Past Medical History:  Diagnosis Date  . Anxiety disorder   . Bladder cancer ([place])   . Coronary artery disease   . Degenerative arthritis   . Diabetes ([place])   . Dyslipidemia   . GERD (gastroesophageal reflux disease)   . Hypertension   . Obesity   . TIA (transient ischemic attack)    MULTIPLE,BIHEMISPHERIC   Past Surgical History:  Past Surgical History:  Procedure Laterality Date  . BLADDER REPAIR    . CORONARY STENT PLACEMENT    . NASAL SINUS SURGERY    . SHOULDER OPEN ROTATOR CUFF REPAIR Right   . TOTAL KNEE ARTHROPLASTY Right    HPI:  Clayton Johnson is an 84 y.o. male past medical history dementia, diabetes, hypertension, bladder cancer, TIA, obesity who transferred from [facility] for management of atrial flutter and family request for an opinion with a neurologist for management of right PCA stroke   Assessment / Plan / Recommendation Clinical Impression  Patient presents with cognitively-based dysphagia, though he appears to have adequate airway protection. No overt signs of aspiration observed despite challenging with consecutive straw sips of thin liquids in excess of 3oz. Pt is alert, oriented to self and intermittently to situation, though he is confused. He requires assistance for self-feeding due to suspected visual impairments as well as decreased sustained attention. Recommend dys 3 (mechanical soft) diet with thin liquids, with full supervision/assistance given pt's need for assistance and increased risk for aspiration due to cognitive impairments. Will follow briefly for diet tolerance with likely advancement of solids with improvements in mentation. Would give  medications whole in puree. D/w RN. Will follow up.  SLP Visit Diagnosis: Dysphagia, unspecified (R13.10)    Aspiration Risk  Mild aspiration risk    Diet Recommendation Dysphagia 3 (Mech soft);Thin liquid   Liquid Administration via: Cup;Straw Medication Administration: Whole meds with puree Supervision: Full supervision/cueing for compensatory strategies;Staff to assist with self feeding Compensations: Slow rate;Small sips/bites;Minimize environmental distractions Postural Changes: Seated upright at 90 degrees    Other  Recommendations Oral Care Recommendations: Oral care BID   Follow up Recommendations Skilled Nursing facility      Frequency and Duration min 2x/week  1 week       Prognosis Prognosis for Safe Diet Advancement: Good Barriers to Reach Goals: Cognitive deficits      Swallow Study   General Date of Onset: 07/14/18 HPI: Clayton Johnson is an 84 y.o. male past medical history dementia, diabetes, hypertension, bladder cancer, TIA, obesity who transferred from [facility] for management of atrial flutter and family request for an opinion with a neurologist for management of right PCA stroke Type of Study: Bedside Swallow Evaluation Previous Swallow Assessment: none on file Diet Prior to this Study: NPO Temperature Spikes Noted: No Respiratory Status: Room air History of Recent Intubation: No Behavior/Cognition: Alert;Confused;Requires cueing;Distractible Oral Cavity Assessment: Within Functional Limits Oral Care Completed by SLP: No Oral Cavity - Dentition: Adequate natural dentition Vision: Functional for self-feeding Self-Feeding Abilities: Needs assist Patient Positioning: Upright in bed Baseline Vocal Quality: Normal Volitional Cough: Cognitively unable to elicit Volitional Swallow: Unable to elicit    Oral/Motor/Sensory Function Overall Oral Motor/Sensory Function: Within functional limits   Ice Chips Ice chips:  Within functional  limits Presentation: Spoon   Thin Liquid Thin Liquid: Within functional limits Presentation: Self Fed;Cup;Straw(tactile assist for self-feedign)    Nectar Thick Nectar Thick Liquid: Not tested   Honey Thick Honey Thick Liquid: Not tested   Puree Puree: Within functional limits Presentation: Spoon(could not self-feed)   Solid     Solid: Within functional limits Presentation: Self Fed(distractible)     Deneise Lever, [place], CCC-SLP Speech-Language Pathologist [phone]  Aliene Altes 07/15/2018,9:51 AM

## 2018-07-15 NOTE — Progress Notes (Signed)
Pt had a run of SVT and Vtach at 2358 and converted back to SR with a HR of 70s at 0008, pt denies any discomfort, Dr Shanon Brow paged and notified, ordered a Magnessium level, will continue to monitor. Clayton Johnson, Clayton Johnson

## 2018-07-15 NOTE — Progress Notes (Signed)
PROGRESS NOTE    Maison Kestenbaum  ZHY:[id number] DOB: Apr 28, 1934 DOA: 07/14/2018 PCP: Ronita Hipps, MD    Brief Narrative:  84 y.o. male with medical history significant of coronary artery disease, hypertension, dyslipidemia, diabetes transferred here from [facility] for neurology evaluation.  Patient has had a full neurology work-up at [facility] showing acute CVA left right occipital with a flutter.  Patient has been getting physical therapy was in the process of being discharged to rehab apparently went into SVT at some point last night.  Currently rate is 70s.  His beta-blocker was increased today.  Patient transferred here due to patient family requesting to be seen by a neurologist face-to-face instead of through telemetry neurology.  Patient is only complaint is a headache at this point.  Assessment & Plan:   Principal Problem:   Cerebrovascular disease, unspecified Active Problems:   Diabetes ([place])   Hypertension   Anxiety disorder   Paroxysmal SVT (supraventricular tachycardia) (HCC)    Cerebrovascular disease, unspecified-acute nonhemorrhagic stroke in the right occipital lobe.   -Continue with aspirin -Plavix on hold per Neurology -Continue to monitor on tele. If evidence of afib, then possibility for eliquis    Diabetes (HCC)-sliding scale insulin continued in hospital    Hypertension-BP stable. Continue current regimen    Anxiety disorder-stable at present    Paroxysmal SVT (supraventricular tachycardia) (HCC)-continued on 25mg  metoprolol bid. HR presently stable in the 90's. Continue to monitor on tele   DVT prophylaxis: SCD's Code Status: DNR Family Communication: Pt in room, family at bedside Disposition Plan: Uncertain at this time  Consultants:   Neurology  Procedures:     Antimicrobials: Anti-infectives (From admission, onward)   None       Subjective: Without complaints  Objective: Vitals:   07/15/18 0600 07/15/18 0800 07/15/18 1204  07/15/18 1603  BP: (!) 176/85 (!) 173/87 132/82 (!) 169/76  Pulse:  95 89 76  Resp: 20 20 20 20   Temp: 97.7 F (36.5 C)  98.3 F (36.8 C) 99.8 F (37.7 C)  TempSrc: Oral  Oral Oral  SpO2: 97% 100% 94%   Weight:      Height:        Intake/Output Summary (Last 24 hours) at 07/15/2018 1821 Last data filed at 07/15/2018 0830 Gross per 24 hour  Intake -  Output 1025 ml  Net -1025 ml   Filed Weights   07/14/18 2000  Weight: 96.5 kg    Examination: General exam: Conversant, in no acute distress Respiratory system: normal chest rise, clear, no audible wheezing Cardiovascular system: regular rhythm, s1-s2 Gastrointestinal system: Nondistended, nontender, pos BS Central nervous system: No seizures, no tremors Extremities: No cyanosis, no joint deformities Skin: No rashes, no pallor Psychiatry: Affect normal // no auditory hallucinations   Data Reviewed: I have personally reviewed following labs and imaging studies  CBC: No results for input(s): WBC, NEUTROABS, HGB, HCT, MCV, PLT in the last 168 hours. Basic Metabolic Panel: Recent Labs  Lab 07/15/18 0456  MG 2.0   GFR: CrCl cannot be calculated (Patient's most recent lab result is older than the maximum 21 days allowed.). Liver Function Tests: No results for input(s): AST, ALT, ALKPHOS, BILITOT, PROT, ALBUMIN in the last 168 hours. No results for input(s): LIPASE, AMYLASE in the last 168 hours. No results for input(s): AMMONIA in the last 168 hours. Coagulation Profile: No results for input(s): INR, PROTIME in the last 168 hours. Cardiac Enzymes: No results for input(s): CKTOTAL, CKMB, CKMBINDEX, TROPONINI in the  last 168 hours. BNP (last 3 results) No results for input(s): PROBNP in the last 8760 hours. HbA1C: Recent Labs    07/15/18 0456  HGBA1C 11.3*   CBG: Recent Labs  Lab 07/14/18 2140 07/15/18 0615 07/15/18 1124 07/15/18 1647  GLUCAP 232* 263* 269* 373*   Lipid Profile: Recent Labs     07/15/18 0456  CHOL 129  HDL 43  LDLCALC 60  TRIG 130  CHOLHDL 3.0   Thyroid Function Tests: No results for input(s): TSH, T4TOTAL, FREET4, T3FREE, THYROIDAB in the last 72 hours. Anemia Panel: No results for input(s): VITAMINB12, FOLATE, FERRITIN, TIBC, IRON, RETICCTPCT in the last 72 hours. Sepsis Labs: No results for input(s): PROCALCITON, LATICACIDVEN in the last 168 hours.  No results found for this or any previous visit (from the past 240 hour(s)).   Radiology Studies: Ct Head Wo Contrast  Result Date: 07/15/2018 CLINICAL DATA:  Follow-up examination for acute stroke. EXAM: CT HEAD WITHOUT CONTRAST TECHNIQUE: Contiguous axial images were obtained from the base of the skull through the vertex without intravenous contrast. COMPARISON:  Prior CT from 07/11/2018 FINDINGS: Brain: Atrophy with chronic small vessel ischemic disease. Scatter remote right basal ganglia and bilateral thalamic lacunar infarcts again noted. Few scattered remote cerebellar infarcts. Evolving subacute right occipital infarct again seen, relatively stable in size and distribution. Mildly increased localized edema without significant regional mass effect. Associated hemorrhage again seen, better appreciated on previous MRI. No other new acute intracranial infarct. No intracranial hemorrhage. No midline shift or mass effect. Ventricular prominence related global parenchymal volume loss of hydrocephalus. No extra-axial fluid collection. Vascular: No hyperdense vessel. Scattered vascular calcifications noted within the carotid siphons. Skull: Scalp soft tissues and calvarium demonstrate no acute abnormality. Sinuses/Orbits: Globes normal soft tissues within normal limits. Other: Mild scattered mucosal thickening within the ethmoidal air cells. Paranasal sinuses and mastoid air cells are otherwise clear. IMPRESSION: 1. Continued interval evolution of subacute hemorrhagic right occipital lobe infarct. Mild localized edema  without significant regional mass effect. Fairly mild hemorrhage visible by CT, better appreciated on recent MRI. No other complication. 2. No other new acute intracranial abnormality. 3. Atrophy with advanced chronic microvascular ischemic disease. Electronically Signed   By: Jeannine Boga M.D.   On: 07/15/2018 04:24    Scheduled Meds: . aspirin EC  81 mg Oral Daily  . finasteride  5 mg Oral QHS  . fluticasone  2 spray Each Nare Daily  . insulin aspart  0-9 Units Subcutaneous TID WC  . metoprolol tartrate  25 mg Oral BID  . pravastatin  10 mg Oral Daily  . tamsulosin  0.4 mg Oral QPC supper   Continuous Infusions:   LOS: 1 day   Marylu Lund, MD [facility] Pager [phone]  If 7PM-7AM, please contact night-coverage [email or web address] Password [email or web address] 07/15/2018, 6:21 PM

## 2018-07-15 NOTE — Consult Note (Addendum)
Requesting Physician: Dr. Shanon Brow    Chief Complaint: headache, blurred vision  History obtained from: Patient, family and Chart     HPI:                                                                                                                                       Etan Vasudevan is an 84 y.o. male past medical history dementia, diabetes, hypertension, bladder cancer, TIA, obesity who transferred from [facility] for management of atrial flutter and family request for an opinion with a neurologist for management of right PCA stroke.  Has known normal was on 8-11, patient developed headache and blurred vision.  Is apparently sent back from the ER.  Presented again on 812 with altered mental status as well as fever.  Work-up revealed a right PCA stroke, however there was hemorrhagic transformation.  He was evaluated by tele-neurology who recommended anticoagulation after 2 weeks as he was noted to be in atrial fibrillation/ A-flutter on telemetry(.  This is confirmed and patient was planned to have outpatient ZIO monitor-Per note.)  Family was not happy that he did not have a face-to-face encounter with the neurologist and requested transfer to [facility]  Date last known well: 8.11.19 tPA Given: no, outside window NIHSS: 2 Baseline MRS 0    Past Medical History:  Diagnosis Date  . Anxiety disorder   . Bladder cancer ([place])   . Coronary artery disease   . Degenerative arthritis   . Diabetes ([place])   . Dyslipidemia   . GERD (gastroesophageal reflux disease)   . Hypertension   . Obesity   . TIA (transient ischemic attack)    MULTIPLE,BIHEMISPHERIC    Past Surgical History:  Procedure Laterality Date  . BLADDER REPAIR    . CORONARY STENT PLACEMENT    . NASAL SINUS SURGERY    . SHOULDER OPEN ROTATOR CUFF REPAIR Right   . TOTAL KNEE ARTHROPLASTY Right     Family History  Problem Relation Age of Onset  . Heart disease Mother   . Stroke Father   . Renal  Disease Sister   . Heart attack Brother    Social History:  reports that he has never smoked. He has never used smokeless tobacco. He reports that he does not drink alcohol or use drugs.  Allergies:  Allergies  Allergen Reactions  . Lactose Intolerance (Gi) Other (See Comments)    Any dairy products make the patient's nasal passages VERY CONGESTED (is allergic)    Medications:  I reviewed home medications   ROS:                                                                                                                                     14 systems reviewed and negative except above    Examination:                                                                                                      General: Appears well-developed and well-nourished.  Psych: Affect appropriate to situation Eyes: No scleral injection HENT: No OP obstrucion Head: Normocephalic.  Cardiovascular: Normal rate and regular rhythm.  Respiratory: Effort normal and breath sounds normal to anterior ascultation GI: Soft.  No distension. There is no tenderness.  Skin: WDI    Neurological Examination Mental Status: Sl;ightly drowsy, confused.   Speech fluent without evidence of aphasia. Able to follow 3 step commands without difficulty. Cranial Nerves: II: Visual fields: L homonymous hemianopsia III,IV, VI: ptosis not present, extra-ocular motions intact bilaterally, pupils equal, round, reactive to light and accommodation V,VII: smile symmetric, facial light touch sensation normal bilaterally VIII: hearing normal bilaterally IX,X: uvula rises symmetrically XI: bilateral shoulder shrug XII: midline tongue extension Motor: Right : Upper extremity   5/5    Left:     Upper extremity   5/5  Lower extremity   5/5     Lower extremity   5/5 Tone and bulk:normal tone throughout; no atrophy  noted Sensory: Pinprick and light touch intact throughout, bilaterally Deep Tendon Reflexes: 2+ and symmetric throughout Plantars: Right: downgoing   Left: downgoing Cerebellar: normal finger-to-nose, normal rapid alternating movements and normal heel-to-shin test Gait: normal gait and station     Lab Results: Basic Metabolic Panel: No results for input(s): NA, K, CL, CO2, GLUCOSE, BUN, CREATININE, CALCIUM, MG, PHOS in the last 168 hours.  CBC: No results for input(s): WBC, NEUTROABS, HGB, HCT, MCV, PLT in the last 168 hours.  Coagulation Studies: No results for input(s): LABPROT, INR in the last 72 hours.  Imaging: Ct Head Wo Contrast  Result Date: 07/15/2018 CLINICAL DATA:  Follow-up examination for acute stroke. EXAM: CT HEAD WITHOUT CONTRAST TECHNIQUE: Contiguous axial images were obtained from the base of the skull through the vertex without intravenous contrast. COMPARISON:  Prior CT from 07/11/2018 FINDINGS: Brain: Atrophy with chronic small vessel ischemic disease. Scatter remote right basal ganglia and bilateral thalamic lacunar infarcts again noted. Few scattered remote cerebellar infarcts. Evolving subacute right occipital infarct again seen,  relatively stable in size and distribution. Mildly increased localized edema without significant regional mass effect. Associated hemorrhage again seen, better appreciated on previous MRI. No other new acute intracranial infarct. No intracranial hemorrhage. No midline shift or mass effect. Ventricular prominence related global parenchymal volume loss of hydrocephalus. No extra-axial fluid collection. Vascular: No hyperdense vessel. Scattered vascular calcifications noted within the carotid siphons. Skull: Scalp soft tissues and calvarium demonstrate no acute abnormality. Sinuses/Orbits: Globes normal soft tissues within normal limits. Other: Mild scattered mucosal thickening within the ethmoidal air cells. Paranasal sinuses and mastoid air  cells are otherwise clear. IMPRESSION: 1. Continued interval evolution of subacute hemorrhagic right occipital lobe infarct. Mild localized edema without significant regional mass effect. Fairly mild hemorrhage visible by CT, better appreciated on recent MRI. No other complication. 2. No other new acute intracranial abnormality. 3. Atrophy with advanced chronic microvascular ischemic disease. Electronically Signed   By: Jeannine Boga M.D.   On: 07/15/2018 04:24     I reviewed images of MRI Brain and CT head. - shows R PCA stroke with hemorrhagic conversion  I reviewed the report of Carotid US: less than 50 stenosis B/L.  ASSESSMENT AND PLAN  Lem Peary is a 84 y.o. male with medical history significant of coronary artery disease, hypertension, dyslipidemia, diabetes transferred here from [facility] for neurology evaluation. Seen by tele neurology for R PCA stroke. Likely 2.2 to Atrial flutter.    R occipital stroke likely 2/2 Atrial flutter  with petechial hemorrhagic conversion - workup completed at [facility] - agree with plan by tele neurologist  - repeat CT Head: improving hemorrhage - D/C plavix, do not see reason for dual antiplatelets, discuss with cardiology if indication from their stand point. Also discuss whether patient needs to be on ASA when starting anticoagulation.  - start Eliquis on 8/25 if Afib/Aflutter is confirmed, if not needs event monitor - Cardiology consult  [place] Pager Number [phone]

## 2018-07-15 NOTE — Evaluation (Signed)
Speech Language Pathology Evaluation Patient Details Name: Clayton Johnson MRN: [id number] DOB: May 03, 1934 Today's Date: 07/15/2018 Time: 3329-5188 SLP Time Calculation (min) (ACUTE ONLY): 16 min  Problem List:  Patient Active Problem List   Diagnosis Date Noted  . Paroxysmal SVT (supraventricular tachycardia) ([place]) 07/14/2018  . Diabetes ([place])   . Hypertension   . Anxiety disorder   . Unspecified transient cerebral ischemia 12/05/2012  . Cerebrovascular disease, unspecified 12/05/2012   Past Medical History:  Past Medical History:  Diagnosis Date  . Anxiety disorder   . Bladder cancer ([place])   . Coronary artery disease   . Degenerative arthritis   . Diabetes ([place])   . Dyslipidemia   . GERD (gastroesophageal reflux disease)   . Hypertension   . Obesity   . TIA (transient ischemic attack)    MULTIPLE,BIHEMISPHERIC   Past Surgical History:  Past Surgical History:  Procedure Laterality Date  . BLADDER REPAIR    . CORONARY STENT PLACEMENT    . NASAL SINUS SURGERY    . SHOULDER OPEN ROTATOR CUFF REPAIR Right   . TOTAL KNEE ARTHROPLASTY Right    HPI:  Clayton Johnson is an 84 y.o. male past medical history dementia, diabetes, hypertension, bladder cancer, TIA, obesity who transferred from [facility] for management of atrial flutter and family request for an opinion with a neurologist for management of right PCA stroke   Assessment / Plan / Recommendation Clinical Impression   Patient presents with moderate-severe cognitive communication impairments, with deficits noted today in sustained attention, simple functional problem solving, ability to follow commands (1 step 80% accuracy, 2 step ~60% accuracy), recall, orientation, and safety awareness. Pt is confused, attempting to get out of bed to "get that back scratcher that guy has in the picture over there" while pointing at the television screen (off). Pt is able to tell SLP he had a stroke, though he has decreased  awareness of deficits and states he needs to go home. Pt able to state how to use the call bell but did not make effort to do so without cues when the need arose (for using the restroom, requesting pain medication). Suspect visual deficits also contributing. Speech is mildly dysarthric without any apparent oral motor weakness. I recommend skilled ST to address the above cognitive impairments to improve pt safety and quality of life.    SLP Assessment  SLP Recommendation/Assessment: Patient needs continued Speech Lanaguage Pathology Services SLP Visit Diagnosis: Cognitive communication deficit (R41.841);Dysarthria and anarthria (R47.1)    Follow Up Recommendations  Skilled Nursing facility    Frequency and Duration min 2x/week  1 week      SLP Evaluation Cognition  Overall Cognitive Status: Impaired/Different from baseline Arousal/Alertness: Awake/alert Orientation Level: Oriented to person;Oriented to situation;Disoriented to place;Disoriented to time Attention: Sustained Sustained Attention: Impaired Sustained Attention Impairment: Verbal basic;Functional basic Memory: Impaired Memory Impairment: Decreased recall of new information(pt with poor recall of details after 3-4 minutes) Awareness: Impaired Awareness Impairment: Intellectual impairment;Emergent impairment;Anticipatory impairment Problem Solving: Impaired Problem Solving Impairment: Functional basic(cues required to determine how to use urinal) Behaviors: Restless Safety/Judgment: Impaired Comments: pt attempting to get out of bed       Comprehension  Auditory Comprehension Overall Auditory Comprehension: Impaired Yes/No Questions: Within Functional Limits Commands: Impaired One Step Basic Commands: 75-100% accurate Two Step Basic Commands: 50-74% accurate Multistep Basic Commands: 0-24% accurate Conversation: Simple Interfering Components: Attention;Pain EffectiveTechniques: Repetition;Visual/Gestural cues Visual  Recognition/Discrimination Discrimination: Not tested Reading Comprehension Reading Status: Not tested  Expression Expression Primary Mode of Expression: Verbal Verbal Expression Overall Verbal Expression: Other (comment)(confused, talking about "conduits", getting to "station 5") Initiation: No impairment Automatic Speech: Name;Social Response Level of Generative/Spontaneous Verbalization: Conversation Repetition: Impaired Level of Impairment: Phrase level(2/2 attention) Written Expression Written Expression: Not tested   Oral / Motor  Oral Motor/Sensory Function Overall Oral Motor/Sensory Function: Within functional limits Motor Speech Overall Motor Speech: Impaired Respiration: Within functional limits Phonation: Normal Articulation: Impaired Level of Impairment: Conversation(oral motor strength appears WFL) Intelligibility: Intelligible Motor Planning: Witnin functional limits   GO                   Deneise Lever, [place], CCC-SLP Speech-Language Pathologist [phone]  Aliene Altes 07/15/2018, 10:03 AM

## 2018-07-15 NOTE — Plan of Care (Signed)
Education given to pt and family

## 2018-07-16 DIAGNOSIS — N183 Chronic kidney disease, stage 3 (moderate): Secondary | ICD-10-CM | POA: Diagnosis not present

## 2018-07-16 DIAGNOSIS — E1169 Type 2 diabetes mellitus with other specified complication: Secondary | ICD-10-CM | POA: Diagnosis not present

## 2018-07-16 DIAGNOSIS — G309 Alzheimer's disease, unspecified: Secondary | ICD-10-CM | POA: Diagnosis not present

## 2018-07-16 DIAGNOSIS — I482 Chronic atrial fibrillation: Secondary | ICD-10-CM | POA: Diagnosis not present

## 2018-07-16 DIAGNOSIS — E785 Hyperlipidemia, unspecified: Secondary | ICD-10-CM | POA: Diagnosis not present

## 2018-07-16 DIAGNOSIS — Z8551 Personal history of malignant neoplasm of bladder: Secondary | ICD-10-CM | POA: Diagnosis not present

## 2018-07-16 DIAGNOSIS — R29702 NIHSS score 2: Secondary | ICD-10-CM | POA: Diagnosis not present

## 2018-07-16 DIAGNOSIS — I10 Essential (primary) hypertension: Secondary | ICD-10-CM | POA: Diagnosis not present

## 2018-07-16 DIAGNOSIS — K219 Gastro-esophageal reflux disease without esophagitis: Secondary | ICD-10-CM | POA: Diagnosis not present

## 2018-07-16 DIAGNOSIS — E1122 Type 2 diabetes mellitus with diabetic chronic kidney disease: Secondary | ICD-10-CM | POA: Diagnosis not present

## 2018-07-16 DIAGNOSIS — I6330 Cerebral infarction due to thrombosis of unspecified cerebral artery: Secondary | ICD-10-CM | POA: Diagnosis not present

## 2018-07-16 DIAGNOSIS — Z7982 Long term (current) use of aspirin: Secondary | ICD-10-CM | POA: Diagnosis not present

## 2018-07-16 DIAGNOSIS — Z8673 Personal history of transient ischemic attack (TIA), and cerebral infarction without residual deficits: Secondary | ICD-10-CM | POA: Diagnosis not present

## 2018-07-16 DIAGNOSIS — Z66 Do not resuscitate: Secondary | ICD-10-CM | POA: Diagnosis not present

## 2018-07-16 DIAGNOSIS — I471 Supraventricular tachycardia: Secondary | ICD-10-CM | POA: Diagnosis not present

## 2018-07-16 DIAGNOSIS — Z7902 Long term (current) use of antithrombotics/antiplatelets: Secondary | ICD-10-CM | POA: Diagnosis not present

## 2018-07-16 DIAGNOSIS — R2689 Other abnormalities of gait and mobility: Secondary | ICD-10-CM | POA: Diagnosis not present

## 2018-07-16 DIAGNOSIS — I2510 Atherosclerotic heart disease of native coronary artery without angina pectoris: Secondary | ICD-10-CM | POA: Diagnosis not present

## 2018-07-16 DIAGNOSIS — I679 Cerebrovascular disease, unspecified: Secondary | ICD-10-CM | POA: Diagnosis not present

## 2018-07-16 DIAGNOSIS — E871 Hypo-osmolality and hyponatremia: Secondary | ICD-10-CM | POA: Diagnosis not present

## 2018-07-16 DIAGNOSIS — I129 Hypertensive chronic kidney disease with stage 1 through stage 4 chronic kidney disease, or unspecified chronic kidney disease: Secondary | ICD-10-CM | POA: Diagnosis not present

## 2018-07-16 DIAGNOSIS — I4892 Unspecified atrial flutter: Secondary | ICD-10-CM | POA: Diagnosis not present

## 2018-07-16 DIAGNOSIS — Z955 Presence of coronary angioplasty implant and graft: Secondary | ICD-10-CM | POA: Diagnosis not present

## 2018-07-16 DIAGNOSIS — R1310 Dysphagia, unspecified: Secondary | ICD-10-CM | POA: Diagnosis not present

## 2018-07-16 LAB — GLUCOSE, CAPILLARY
GLUCOSE-CAPILLARY: 361 mg/dL — AB (ref 70–99)
GLUCOSE-CAPILLARY: 159 mg/dL — AB (ref 70–99)
GLUCOSE-CAPILLARY: 69 mg/dL — AB (ref 70–99)
Glucose-Capillary: 268 mg/dL — ABNORMAL HIGH (ref 70–99)
Glucose-Capillary: 440 mg/dL — ABNORMAL HIGH (ref 70–99)
Glucose-Capillary: 68 mg/dL — ABNORMAL LOW (ref 70–99)

## 2018-07-16 LAB — BASIC METABOLIC PANEL
Anion gap: 11 (ref 5–15)
BUN: 20 mg/dL (ref 8–23)
CALCIUM: 8.7 mg/dL — AB (ref 8.9–10.3)
CO2: 23 mmol/L (ref 22–32)
CREATININE: 1.55 mg/dL — AB (ref 0.61–1.24)
Chloride: 105 mmol/L (ref 98–111)
GFR calc Af Amer: 47 mL/min — ABNORMAL LOW (ref >60)
GFR, EST NON AFRICAN AMERICAN: 40 mL/min — AB (ref >60)
Glucose, Bld: 297 mg/dL — ABNORMAL HIGH (ref 70–99)
Potassium: 3.9 mmol/L (ref 3.5–5.1)
SODIUM: 139 mmol/L (ref 135–145)

## 2018-07-16 LAB — MAGNESIUM: MAGNESIUM: 2.1 mg/dL (ref 1.7–2.4)

## 2018-07-16 MED ORDER — INSULIN ISOPHANE & REGULAR (HUMAN 70-30)100 UNIT/ML KWIKPEN: 20.0000 [IU] | PEN_INJECTOR | SUBCUTANEOUS | Status: DC

## 2018-07-16 MED ORDER — INSULIN ASPART 100 UNIT/ML ~~LOC~~ SOLN
0.0000 [IU] | Freq: Every day | SUBCUTANEOUS | Status: DC
  Administered 2018-07-17: 2 [IU] via SUBCUTANEOUS

## 2018-07-16 MED ORDER — INSULIN ASPART 100 UNIT/ML ~~LOC~~ SOLN
0.0000 [IU] | Freq: Three times a day (TID) | SUBCUTANEOUS | Status: DC
  Administered 2018-07-16: 15 [IU] via SUBCUTANEOUS
  Administered 2018-07-17 (×2): 5 [IU] via SUBCUTANEOUS
  Administered 2018-07-17: 3 [IU] via SUBCUTANEOUS
  Administered 2018-07-18: 5 [IU] via SUBCUTANEOUS
  Administered 2018-07-18: 3 [IU] via SUBCUTANEOUS
  Administered 2018-07-18: 8 [IU] via SUBCUTANEOUS
  Administered 2018-07-19: 5 [IU] via SUBCUTANEOUS
  Administered 2018-07-19: 8 [IU] via SUBCUTANEOUS

## 2018-07-16 MED ORDER — INSULIN ASPART PROT & ASPART (70-30 MIX) 100 UNIT/ML ~~LOC~~ SUSP
20.0000 [IU] | Freq: Two times a day (BID) | SUBCUTANEOUS | Status: DC
  Administered 2018-07-16 – 2018-07-18 (×4): 20 [IU] via SUBCUTANEOUS
  Filled 2018-07-16: qty 10

## 2018-07-16 NOTE — Progress Notes (Signed)
At 2125 pt CBG was 68. Pt was administered grape juice with three packets of sugar added. At 2150 CBG was 69. At 2225 CBG had risen to 159. No SSI coverage required.

## 2018-07-16 NOTE — NC FL2 (Signed)
[place] LEVEL OF CARE SCREENING TOOL     IDENTIFICATION  Patient Name: Clayton Johnson Birthdate: 1934-06-25 Sex: male Admission Date (Current Location): 07/14/2018  [facility] [place] Number:  Herbalist and Address:  [facility], [place] [address] [place], [place] [address]      Provider Number: [id number]  Attending Physician Name and Address:  Donne Hazel, MD  Relative Name and Phone Number:  Sladen Plancarte, daughter, [phone]    Current Level of Care: Hospital Recommended Level of Care: [place] Prior Approval Number:    Date Approved/Denied:   PASRR Number: [id number]  Discharge Plan: SNF    Current Diagnoses: Patient Active Problem List   Diagnosis Date Noted  . Paroxysmal SVT (supraventricular tachycardia) ([place]) 07/14/2018  . Diabetes ([place])   . Hypertension   . Anxiety disorder   . Unspecified transient cerebral ischemia 12/05/2012  . Cerebrovascular disease, unspecified 12/05/2012    Orientation RESPIRATION BLADDER Height & Weight     Self, Situation, Place  Normal Incontinent Weight: 212 lb 11.9 oz (96.5 kg) Height:  5\' 8"  (172.7 cm)  BEHAVIORAL SYMPTOMS/MOOD NEUROLOGICAL BOWEL NUTRITION STATUS      Continent Diet(please see DC summary)  AMBULATORY STATUS COMMUNICATION OF NEEDS Skin   Limited Assist Verbally Normal                       Personal Care Assistance Level of Assistance  Bathing, Dressing, Feeding Bathing Assistance: Limited assistance Feeding assistance: Independent Dressing Assistance: Limited assistance     Functional Limitations Info  Sight, Hearing, Speech Sight Info: Impaired Hearing Info: Adequate Speech Info: Adequate    SPECIAL CARE FACTORS FREQUENCY  PT (By licensed PT), OT (By licensed OT)     PT Frequency: 5x/week OT Frequency: 5x/week            Contractures Contractures Info: Not present    Additional Factors Info  Code  Status, Allergies, Insulin Sliding Scale Code Status Info: DNR Allergies Info: Lactose Intolerance (Gi)   Insulin Sliding Scale Info: novolog 3x/day with meals       Current Medications (07/16/2018):  This is the current hospital active medication list Current Facility-Administered Medications  Medication Dose Route Frequency Provider Last Rate Last Dose  . acetaminophen (TYLENOL) tablet 650 mg  650 mg Oral Q4H PRN Phillips Grout, MD   650 mg at 07/16/18 0957   Or  . acetaminophen (TYLENOL) solution 650 mg  650 mg Per Tube Q4H PRN Phillips Grout, MD       Or  . acetaminophen (TYLENOL) suppository 650 mg  650 mg Rectal Q4H PRN Phillips Grout, MD   650 mg at 07/14/18 2258  . aspirin EC tablet 81 mg  81 mg Oral Daily Derrill Kay A, MD   81 mg at 07/16/18 0957  . diphenhydrAMINE-zinc acetate (BENADRYL) 2-0.1 % cream   Topical BID PRN Donne Hazel, MD      . finasteride (PROSCAR) tablet 5 mg  5 mg Oral QHS Donne Hazel, MD   5 mg at 07/15/18 2212  . fluticasone (FLONASE) 50 MCG/ACT nasal spray 2 spray  2 spray Each Nare Daily Donne Hazel, MD   2 spray at 07/16/18 1002  . guaiFENesin (MUCINEX) 12 hr tablet 600 mg  600 mg Oral BID PRN Lovey Newcomer T, NP   600 mg at 07/15/18 2357  . insulin aspart (novoLOG) injection 0-15  Units  0-15 Units Subcutaneous TID WC Donne Hazel, MD      . insulin aspart (novoLOG) injection 0-5 Units  0-5 Units Subcutaneous QHS Donne Hazel, MD      . labetalol (NORMODYNE,TRANDATE) injection 5 mg  5 mg Intravenous Q2H PRN Donne Hazel, MD      . metoprolol tartrate (LOPRESSOR) tablet 25 mg  25 mg Oral BID Donne Hazel, MD   25 mg at 07/16/18 0957  . pravastatin (PRAVACHOL) tablet 10 mg  10 mg Oral Daily Donne Hazel, MD   10 mg at 07/16/18 0957  . tamsulosin (FLOMAX) capsule 0.4 mg  0.4 mg Oral QPC supper Donne Hazel, MD   0.4 mg at 07/15/18 1757     Discharge Medications: Please see discharge summary for a list of discharge  medications.  Relevant Imaging Results:  Relevant Lab Results:   Additional Information SSN: [id number]  Estanislado Emms, LCSW

## 2018-07-16 NOTE — Progress Notes (Signed)
Short Note:   R occipital stroke likely 2/2 Atrial flutter  with petechial hemorrhagic conversion - workup completed at [facility]  - repeat CT Head: stable hemorrhage - D/C plavix, unclear reason for dual therapy with ASA unless it was from a cardiology standpoint, would clarify with cardiology. - Continue ASA alone for now - start Eliquis on 8/25 ( 14 days after his stroke) if Afib/Aflutter is confirmed, if not needs event monitor. As per notes looks like he has had a monitor that confirmed flutter/Afib in the past. Would consult cardiology to clarify and confirm and if not will need to be evaluated for a loop recorder. - Please call back with any questions

## 2018-07-16 NOTE — Progress Notes (Signed)
PROGRESS NOTE    Clayton Johnson  EVO:[id number] DOB: 1934-03-09 DOA: 07/14/2018 PCP: Ronita Hipps, MD    Brief Narrative:  84 y.o. male with medical history significant of coronary artery disease, hypertension, dyslipidemia, diabetes transferred here from [facility] for neurology evaluation.  Patient has had a full neurology work-up at [facility] showing acute CVA left right occipital with a flutter.  Patient has been getting physical therapy was in the process of being discharged to rehab apparently went into SVT at some point last night.  Currently rate is 70s.  His beta-blocker was increased today.  Patient transferred here due to patient family requesting to be seen by a neurologist face-to-face instead of through telemetry neurology.  Patient is only complaint is a headache at this point.  Assessment & Plan:   Principal Problem:   Cerebrovascular disease, unspecified Active Problems:   Diabetes ([place])   Hypertension   Anxiety disorder   Paroxysmal SVT (supraventricular tachycardia) (HCC)    Cerebrovascular disease, unspecified-acute nonhemorrhagic stroke in the right occipital lobe.   -Patient continued with aspirin -Plavix on hold per Neurology -Continue on monitor, see below regarding anticoagulation -Therapy has recommended SNF -Family is interested in CIR. Have placed consult for CIR    Diabetes (HCC)-poorly controlled. - Will ensure a diabetic diet - SSI increased to moderate scale while in hospital - Med-rec reviewed. Both lantus and 70-30 listed on med-rec - Will continue with 70-30 at 20 units BID per home regimen for now    Hypertension- -BP stable at this time.  - Continue current regimen as tolerated    Anxiety disorder - Presently stable at this time    Paroxysmal SVT (supraventricular tachycardia) ([place])- -continued on 25mg  metoprolol bid. -HR remains stable in the 70-90's -Records from [facility] obtained and closely reviewed. Patient was seen  by Cardiology, Dr. Raynelle Dick, who did notice rapid narrow complex rhythm while on monitor with concern for the possibility of flutter. Recommendations were made for 14 day heart monitor on discharge. Cardiology was aware of plan to cont to hold anticoagulation per Neurology   DVT prophylaxis: SCD's Code Status: DNR Family Communication: Pt in room, family at bedside Disposition Plan: Uncertain at this time  Consultants:   Neurology  Procedures:     Antimicrobials: Anti-infectives (From admission, onward)   None      Subjective: No complaints at this time. Family is interested in CIR  Objective: Vitals:   07/16/18 0307 07/16/18 0821 07/16/18 1238 07/16/18 1613  BP: (!) 142/75 (!) 155/78 (!) 134/55 133/71  Pulse: (!) 55 (!) 47 (!) 43 (!) 42  Resp: 18 20 18 20   Temp: 98.2 F (36.8 C) 98.7 F (37.1 C) 97.7 F (36.5 C) 98.7 F (37.1 C)  TempSrc: Oral Oral Oral Oral  SpO2: 92% 94% 97% 99%  Weight:      Height:        Intake/Output Summary (Last 24 hours) at 07/16/2018 1644 Last data filed at 07/16/2018 0130 Gross per 24 hour  Intake -  Output 200 ml  Net -200 ml   Filed Weights   07/14/18 2000  Weight: 96.5 kg    Examination: General exam: Awake, laying in bed, in nad Respiratory system: Normal respiratory effort, no wheezing Cardiovascular system: regular rate, s1, s2 Gastrointestinal system: Soft, nondistended, positive BS Central nervous system: CN2-12 grossly intact, strength intact Extremities: Perfused, no clubbing Skin: Normal skin turgor, no notable skin lesions seen Psychiatry: Mood normal // no visual hallucinations   Data  Reviewed: I have personally reviewed following labs and imaging studies  CBC: No results for input(s): WBC, NEUTROABS, HGB, HCT, MCV, PLT in the last 168 hours. Basic Metabolic Panel: Recent Labs  Lab 07/15/18 0456 07/16/18 0332  NA  --  139  K  --  3.9  CL  --  105  CO2  --  23  GLUCOSE  --  297*  BUN  --  20    CREATININE  --  1.55*  CALCIUM  --  8.7*  MG 2.0 2.1   GFR: Estimated Creatinine Clearance: 42.1 mL/min (A) (by C-G formula based on SCr of 1.55 mg/dL (H)). Liver Function Tests: No results for input(s): AST, ALT, ALKPHOS, BILITOT, PROT, ALBUMIN in the last 168 hours. No results for input(s): LIPASE, AMYLASE in the last 168 hours. No results for input(s): AMMONIA in the last 168 hours. Coagulation Profile: No results for input(s): INR, PROTIME in the last 168 hours. Cardiac Enzymes: No results for input(s): CKTOTAL, CKMB, CKMBINDEX, TROPONINI in the last 168 hours. BNP (last 3 results) No results for input(s): PROBNP in the last 8760 hours. HbA1C: Recent Labs    07/15/18 0456  HGBA1C 11.3*   CBG: Recent Labs  Lab 07/15/18 1647 07/15/18 2143 07/16/18 0539 07/16/18 1128 07/16/18 1617  GLUCAP 373* 272* 268* 440* 361*   Lipid Profile: Recent Labs    07/15/18 0456  CHOL 129  HDL 43  LDLCALC 60  TRIG 130  CHOLHDL 3.0   Thyroid Function Tests: No results for input(s): TSH, T4TOTAL, FREET4, T3FREE, THYROIDAB in the last 72 hours. Anemia Panel: No results for input(s): VITAMINB12, FOLATE, FERRITIN, TIBC, IRON, RETICCTPCT in the last 72 hours. Sepsis Labs: No results for input(s): PROCALCITON, LATICACIDVEN in the last 168 hours.  No results found for this or any previous visit (from the past 240 hour(s)).   Radiology Studies: Ct Head Wo Contrast  Result Date: 07/15/2018 CLINICAL DATA:  Follow-up examination for acute stroke. EXAM: CT HEAD WITHOUT CONTRAST TECHNIQUE: Contiguous axial images were obtained from the base of the skull through the vertex without intravenous contrast. COMPARISON:  Prior CT from 07/11/2018 FINDINGS: Brain: Atrophy with chronic small vessel ischemic disease. Scatter remote right basal ganglia and bilateral thalamic lacunar infarcts again noted. Few scattered remote cerebellar infarcts. Evolving subacute right occipital infarct again seen,  relatively stable in size and distribution. Mildly increased localized edema without significant regional mass effect. Associated hemorrhage again seen, better appreciated on previous MRI. No other new acute intracranial infarct. No intracranial hemorrhage. No midline shift or mass effect. Ventricular prominence related global parenchymal volume loss of hydrocephalus. No extra-axial fluid collection. Vascular: No hyperdense vessel. Scattered vascular calcifications noted within the carotid siphons. Skull: Scalp soft tissues and calvarium demonstrate no acute abnormality. Sinuses/Orbits: Globes normal soft tissues within normal limits. Other: Mild scattered mucosal thickening within the ethmoidal air cells. Paranasal sinuses and mastoid air cells are otherwise clear. IMPRESSION: 1. Continued interval evolution of subacute hemorrhagic right occipital lobe infarct. Mild localized edema without significant regional mass effect. Fairly mild hemorrhage visible by CT, better appreciated on recent MRI. No other complication. 2. No other new acute intracranial abnormality. 3. Atrophy with advanced chronic microvascular ischemic disease. Electronically Signed   By: Jeannine Boga M.D.   On: 07/15/2018 04:24    Scheduled Meds: . aspirin EC  81 mg Oral Daily  . finasteride  5 mg Oral QHS  . fluticasone  2 spray Each Nare Daily  . insulin aspart  0-15  Units Subcutaneous TID WC  . insulin aspart  0-5 Units Subcutaneous QHS  . metoprolol tartrate  25 mg Oral BID  . pravastatin  10 mg Oral Daily  . tamsulosin  0.4 mg Oral QPC supper   Continuous Infusions:   LOS: 1 day   Marylu Lund, MD [facility] Pager [phone]  If 7PM-7AM, please contact night-coverage [email or web address] Password [email or web address] 07/16/2018, 4:44 PM

## 2018-07-16 NOTE — Clinical Social Work Note (Signed)
Clinical Social Work Assessment  Patient Details  Name: Clayton Johnson MRN: [id number] Date of Birth: [birth year]  Date of referral:  07/16/18               Reason for consult:  Facility Placement, Discharge Planning                Permission sought to share information with:  Facility Sport and exercise psychologist, Family Supports Permission granted to share information::  Yes, Verbal Permission Granted  Name::     Quamaine Webb  Agency::  SNFs  Relationship::  daughter  Contact Information:  [phone]  Housing/Transportation Living arrangements for the past 2 months:  Single Family Home Source of Information:  Adult Children Patient Interpreter Needed:  None Criminal Activity/Legal Involvement Pertinent to Current Situation/Hospitalization:  No - Comment as needed Significant Relationships:  Adult Children Lives with:  Self Do you feel safe going back to the place where you live?  Yes Need for family participation in patient care:  Yes (Comment)  Care giving concerns: Patient from home. Transfer from [facility]. PT recommending SNF.    Social Worker assessment / plan: CSW met with patient and family - son, daughter, and daughter-in-law, at bedside. Patient sitting up in bedside chair, but lethargic and did not interact with CSW. CSW introduced self and role and discussed disposition planning. Son Karma Greaser and daughter Lenna Sciara indicated patient was transferred from [facility], where he had been worked up for SNF. They had initially chosen [facility], but would like to see if [facility] has beds available. CSW to send out initial referrals and provide bed offers when available.  Patient does not currently have insurance listed in chart. Melissa produced an insurance card for patient - [facility]. CSW advised family to provide insurance information to hospital admissions. Patient will require insurance authorization before admitting to facility.    CSW to follow and support with discharge planning.  Employment status:  Retired Research officer, political party) PT Recommendations:  [place] / Referral to community resources:  [place]  Patient/Family's Response to care: Family appreciative of care.  Patient/Family's Understanding of and Emotional Response to Diagnosis, Current Treatment, and Prognosis: Family with understanding of patient's condition. They have questions about when he will be stable for discharge.   Emotional Assessment Appearance:  Appears stated age Attitude/Demeanor/Rapport:  Unable to Assess Affect (typically observed):  Unable to Assess Orientation:  Oriented to Self, Oriented to Place, Oriented to Situation Alcohol / Substance use:  Not Applicable Psych involvement (Current and /or in the community):  No (Comment)  Discharge Needs  Concerns to be addressed:  Discharge Planning Concerns, Care Coordination Readmission within the last 30 days:  No Current discharge risk:  Physical Impairment Barriers to Discharge:  Continued Medical Work up, [place], LCSW 07/16/2018, 9:52 AM

## 2018-07-16 NOTE — Evaluation (Signed)
Occupational Therapy Evaluation Patient Details Name: Clayton Johnson MRN: [id number] DOB: [birth year] Today's Date: 07/16/2018    History of Present Illness Clayton Johnson is an 84 y.o. male past medical history dementia, diabetes, hypertension, bladder cancer, TIA, obesity who transferred from [facility] for management of atrial flutter and family request for an opinion with a neurologist for management of right PCA stroke.   Clinical Impression   Pt admitted with above. He demonstrates the below listed deficits and will benefit from continued OT to maximize safety and independence with BADLs.  Pt presents to OT with generalized weakness, decreased activity tolerance, impaired balance, Lt spatial inattention/neglect.  He currently requires mod - max A for ADLs, and min A- mod A for functional mobility - requires increased assist to negotiate obstacles on the lt and with Lt turns.  Recommend SNF level rehab at discharge.       Follow Up Recommendations  SNF;Supervision/Assistance - 24 hour    Equipment Recommendations  None recommended by OT    Recommendations for Other Services       Precautions / Restrictions Precautions Precautions: Fall Precaution Comments: Lt spatial neglect and possible field deficit       Mobility Bed Mobility Overal bed mobility: Needs Assistance Bed Mobility: Supine to Sit     Supine to sit: Mod assist     General bed mobility comments: assist to initiate movement, to fully move LEs off the bed and to lift trunk from the bed   Transfers Overall transfer level: Needs assistance Equipment used: Rolling walker (2 wheeled);1 person hand held assist Transfers: Sit to/from [facility] Sit to Stand: Min assist Stand pivot transfers: Min assist;Mod assist       General transfer comment: Min A to transfer to the Rt, and mod A and max multimodal cues to transfer to the Lt.      Balance Overall balance assessment: Needs  assistance Sitting-balance support: Feet supported Sitting balance-Leahy Scale: Good Sitting balance - Comments: able to don/doff socks sitting EOB    Standing balance support: Bilateral upper extremity supported Standing balance-Leahy Scale: Poor Standing balance comment: reliant on UE support                            ADL either performed or assessed with clinical judgement   ADL Overall ADL's : Needs assistance/impaired Eating/Feeding: Maximal assistance Eating/Feeding Details (indicate cue type and reason): daughter reports pt with poor appetite so they have been assisting him with self feeding  Grooming: Wash/dry hands;Wash/dry face;Oral care;Brushing hair;Moderate assistance;Standing   Upper Body Bathing: Minimal assistance;Sitting   Lower Body Bathing: Moderate assistance;Sit to/from stand   Upper Body Dressing : Maximal assistance;Sitting   Lower Body Dressing: Moderate assistance;Sit to/from stand Lower Body Dressing Details (indicate cue type and reason): able to don/doff socks with cues repeated several times and cues to locate socks as he does not consistently scan past midline  Toilet Transfer: Moderate assistance;Ambulation;Comfort height toilet;Grab bars;RW Armed forces technical officer Details (indicate cue type and reason): Pt with significant difficulty making Lt hand turns - requires physical assist and max cues to complete a turn  Toileting- Clothing Manipulation and Hygiene: Moderate assistance;Sit to/from stand       Functional mobility during ADLs: Moderate assistance;Rolling walker       Vision Baseline Vision/History: Wears glasses Vision Assessment?: Yes Additional Comments: Pt with difficulty following complex commands to participate fully in visual assessment. Initially when looking to  the Lt, he will turn his head (with max cues), but eyes deviate to the Rt.   He requires max cues to locate items on the Lt, and at times is unsuccessful       Perception Perception Perception Tested?: Yes Perception Deficits: Inattention/neglect Inattention/Neglect: Does not attend to left visual field Spatial deficits: See comments under vision.  Does not appear to neglect Lt UE or Lt LE    Praxis Praxis Praxis tested?: Deficits Deficits: Initiation    Pertinent Vitals/Pain Pain Assessment: Faces Faces Pain Scale: No hurt     Hand Dominance Right   Extremity/Trunk Assessment Upper Extremity Assessment Upper Extremity Assessment: Generalized weakness   Lower Extremity Assessment Lower Extremity Assessment: Defer to PT evaluation   Cervical / Trunk Assessment Cervical / Trunk Assessment: Normal   Communication Communication Communication: Other (comment)(low volume and mumbles )   Cognition Arousal/Alertness: Awake/alert Behavior During Therapy: WFL for tasks assessed/performed Overall Cognitive Status: Impaired/Different from baseline Area of Impairment: Attention;Orientation;Memory;Following commands;Safety/judgement;Awareness;Problem solving                 Orientation Level: Disoriented to;Place;Time;Situation Current Attention Level: Sustained Memory: Decreased short-term memory Following Commands: Follows one step commands consistently;Follows one step commands inconsistently Safety/Judgement: Decreased awareness of safety;Decreased awareness of deficits   Problem Solving: Slow processing;Decreased initiation;Difficulty sequencing;Requires verbal cues;Requires tactile cues General Comments: At times, pt required instructions to be repeated several times before he was able to initiate activity.      General Comments  daughter present and instructed in Lt inattention/neglect     Exercises     Shoulder Instructions      Home Living Family/patient expects to be discharged to:: Skilled nursing facility Living Arrangements: Alone Available Help at Discharge: Family;Available PRN/intermittently Type of Home:  House                           Additional Comments: Pt has a hired caregiver       Prior Functioning/Environment Level of Independence: Needs assistance  Gait / Transfers Assistance Needed: Pt ambulated independently with no AD  ADL's / Homemaking Assistance Needed: Pt was able to perform ADLs mod I, but had a hired caregiver to ensure he did ADLs daily, took medications appropriately and ate appropriately             OT Problem List: Decreased activity tolerance;Impaired balance (sitting and/or standing);Impaired vision/perception;Decreased cognition;Decreased safety awareness;Decreased knowledge of use of DME or AE      OT Treatment/Interventions: Self-care/ADL training;Neuromuscular education;DME and/or AE instruction;Therapeutic activities;Cognitive remediation/compensation;Visual/perceptual remediation/compensation;Patient/family education;Balance training    OT Goals(Current goals can be found in the care plan section) Acute Rehab OT Goals Patient Stated Goal: Pt unable to fully participate, but daughter is hopeful he will learn how to manage his ADLs and mobility in order to return home after rehab  OT Goal Formulation: With patient/family Time For Goal Achievement: 07/30/18 Potential to Achieve Goals: Good ADL Goals Pt Will Perform Eating: with supervision;sitting Pt Will Perform Grooming: with min guard assist;standing Pt Will Transfer to Toilet: with min assist;ambulating;regular height toilet;bedside commode;grab bars Pt Will Perform Toileting - Clothing Manipulation and hygiene: with min assist;sit to/from stand Additional ADL Goal #1: Pt will locate ADL items on Lt with mod cues  OT Frequency: Min 2X/week   Barriers to D/C: Decreased caregiver support          Co-evaluation  AM-PAC PT "6 Clicks" Daily Activity     Outcome Measure Help from another person eating meals?: A Lot Help from another person taking care of personal  grooming?: A Lot Help from another person toileting, which includes using toliet, bedpan, or urinal?: A Lot Help from another person bathing (including washing, rinsing, drying)?: A Lot Help from another person to put on and taking off regular upper body clothing?: A Lot Help from another person to put on and taking off regular lower body clothing?: A Lot 6 Click Score: 12   End of Session Equipment Utilized During Treatment: Rolling walker;Gait belt Nurse Communication: Mobility status  Activity Tolerance: Patient tolerated treatment well Patient left: in chair;with call bell/phone within reach;with chair alarm set;with family/visitor present  OT Visit Diagnosis: Unsteadiness on feet (R26.81);Cognitive communication deficit (R41.841) Symptoms and signs involving cognitive functions: Cerebral infarction                Time: 2482-5003 OT Time Calculation (min): 37 min Charges:  OT General Charges $OT Visit: 1 Visit OT Evaluation $OT Eval Moderate Complexity: 1 Mod OT Treatments $Self Care/Home Management : 8-22 mins  [facility], OTR/L [phone]   Lucille Passy M 07/16/2018, 11:38 AM

## 2018-07-17 DIAGNOSIS — N183 Chronic kidney disease, stage 3 unspecified: Secondary | ICD-10-CM

## 2018-07-17 DIAGNOSIS — I4892 Unspecified atrial flutter: Secondary | ICD-10-CM | POA: Diagnosis not present

## 2018-07-17 DIAGNOSIS — Z8551 Personal history of malignant neoplasm of bladder: Secondary | ICD-10-CM | POA: Diagnosis not present

## 2018-07-17 DIAGNOSIS — Z7982 Long term (current) use of aspirin: Secondary | ICD-10-CM | POA: Diagnosis not present

## 2018-07-17 DIAGNOSIS — I471 Supraventricular tachycardia: Secondary | ICD-10-CM | POA: Diagnosis not present

## 2018-07-17 DIAGNOSIS — Z8673 Personal history of transient ischemic attack (TIA), and cerebral infarction without residual deficits: Secondary | ICD-10-CM | POA: Diagnosis not present

## 2018-07-17 DIAGNOSIS — E1169 Type 2 diabetes mellitus with other specified complication: Secondary | ICD-10-CM | POA: Diagnosis not present

## 2018-07-17 DIAGNOSIS — R29702 NIHSS score 2: Secondary | ICD-10-CM | POA: Diagnosis not present

## 2018-07-17 DIAGNOSIS — I679 Cerebrovascular disease, unspecified: Secondary | ICD-10-CM

## 2018-07-17 DIAGNOSIS — R2689 Other abnormalities of gait and mobility: Secondary | ICD-10-CM | POA: Diagnosis not present

## 2018-07-17 DIAGNOSIS — E1122 Type 2 diabetes mellitus with diabetic chronic kidney disease: Secondary | ICD-10-CM | POA: Diagnosis not present

## 2018-07-17 DIAGNOSIS — E871 Hypo-osmolality and hyponatremia: Secondary | ICD-10-CM | POA: Diagnosis not present

## 2018-07-17 DIAGNOSIS — G309 Alzheimer's disease, unspecified: Secondary | ICD-10-CM | POA: Diagnosis not present

## 2018-07-17 DIAGNOSIS — Z955 Presence of coronary angioplasty implant and graft: Secondary | ICD-10-CM | POA: Diagnosis not present

## 2018-07-17 DIAGNOSIS — I6330 Cerebral infarction due to thrombosis of unspecified cerebral artery: Secondary | ICD-10-CM

## 2018-07-17 DIAGNOSIS — E669 Obesity, unspecified: Secondary | ICD-10-CM

## 2018-07-17 DIAGNOSIS — Z7902 Long term (current) use of antithrombotics/antiplatelets: Secondary | ICD-10-CM | POA: Diagnosis not present

## 2018-07-17 DIAGNOSIS — F0390 Unspecified dementia without behavioral disturbance: Secondary | ICD-10-CM

## 2018-07-17 DIAGNOSIS — I10 Essential (primary) hypertension: Secondary | ICD-10-CM

## 2018-07-17 DIAGNOSIS — E785 Hyperlipidemia, unspecified: Secondary | ICD-10-CM | POA: Diagnosis not present

## 2018-07-17 DIAGNOSIS — R1310 Dysphagia, unspecified: Secondary | ICD-10-CM | POA: Diagnosis not present

## 2018-07-17 DIAGNOSIS — K219 Gastro-esophageal reflux disease without esophagitis: Secondary | ICD-10-CM | POA: Diagnosis not present

## 2018-07-17 DIAGNOSIS — C679 Malignant neoplasm of bladder, unspecified: Secondary | ICD-10-CM

## 2018-07-17 DIAGNOSIS — I2510 Atherosclerotic heart disease of native coronary artery without angina pectoris: Secondary | ICD-10-CM | POA: Diagnosis not present

## 2018-07-17 DIAGNOSIS — I69391 Dysphagia following cerebral infarction: Secondary | ICD-10-CM | POA: Diagnosis not present

## 2018-07-17 DIAGNOSIS — I6930 Unspecified sequelae of cerebral infarction: Secondary | ICD-10-CM

## 2018-07-17 DIAGNOSIS — Z66 Do not resuscitate: Secondary | ICD-10-CM | POA: Diagnosis not present

## 2018-07-17 DIAGNOSIS — I482 Chronic atrial fibrillation: Secondary | ICD-10-CM | POA: Diagnosis not present

## 2018-07-17 DIAGNOSIS — I129 Hypertensive chronic kidney disease with stage 1 through stage 4 chronic kidney disease, or unspecified chronic kidney disease: Secondary | ICD-10-CM | POA: Diagnosis not present

## 2018-07-17 LAB — GLUCOSE, CAPILLARY
GLUCOSE-CAPILLARY: 232 mg/dL — AB (ref 70–99)
Glucose-Capillary: 166 mg/dL — ABNORMAL HIGH (ref 70–99)
Glucose-Capillary: 216 mg/dL — ABNORMAL HIGH (ref 70–99)
Glucose-Capillary: 236 mg/dL — ABNORMAL HIGH (ref 70–99)

## 2018-07-17 LAB — BASIC METABOLIC PANEL
ANION GAP: 12 (ref 5–15)
BUN: 25 mg/dL — ABNORMAL HIGH (ref 8–23)
CALCIUM: 8.7 mg/dL — AB (ref 8.9–10.3)
CO2: 25 mmol/L (ref 22–32)
Chloride: 104 mmol/L (ref 98–111)
Creatinine, Ser: 1.49 mg/dL — ABNORMAL HIGH (ref 0.61–1.24)
GFR calc Af Amer: 49 mL/min — ABNORMAL LOW (ref >60)
GFR, EST NON AFRICAN AMERICAN: 42 mL/min — AB (ref >60)
GLUCOSE: 148 mg/dL — AB (ref 70–99)
POTASSIUM: 3.8 mmol/L (ref 3.5–5.1)
Sodium: 141 mmol/L (ref 135–145)

## 2018-07-17 LAB — URINALYSIS, ROUTINE W REFLEX MICROSCOPIC
BILIRUBIN URINE: NEGATIVE
KETONES UR: NEGATIVE mg/dL
Nitrite: NEGATIVE
PH: 5 (ref 5.0–8.0)
PROTEIN: NEGATIVE mg/dL
Specific Gravity, Urine: 1.015 (ref 1.005–1.030)

## 2018-07-17 MED ORDER — NYSTATIN 100000 UNIT/GM EX POWD
Freq: Three times a day (TID) | CUTANEOUS | Status: DC
  Administered 2018-07-17 – 2018-07-19 (×7): via TOPICAL
  Filled 2018-07-17: qty 15

## 2018-07-17 NOTE — Progress Notes (Addendum)
Inpatient Diabetes Program Recommendations  AACE/ADA: New Consensus Statement on Inpatient Glycemic Control (2015)  Target Ranges:  Prepandial:   less than 140 mg/dL      Peak postprandial:   less than 180 mg/dL (1-2 hours)      Critically ill patients:  140 - 180 mg/dL   Lab Results  Component Value Date   GLUCAP 166 (H) 07/17/2018   HGBA1C 11.3 (H) 07/15/2018    Review of Glycemic ControlResults for Clayton Johnson, Clayton Johnson (MRN [id number]) as of 07/17/2018 11:22  Ref. Range 07/16/2018 05:39 07/16/2018 11:28 07/16/2018 16:17 07/16/2018 21:25 07/16/2018 21:50 07/16/2018 22:25 07/17/2018 06:19  Glucose-Capillary Latest Ref Range: 70 - 99 mg/dL 268 (H) 440 (H) 361 (H) 68 (L) 69 (L) 159 (H) 166 (H)    Diabetes history: Type 2 DM  Outpatient Diabetes medications: Lantus 50 units q HS, Novolin 70/30 20 units bid Current orders for Inpatient glycemic control:  Novolog moderate tid with meals and HS, Novolog 70/30 mix 20 units bid  Inpatient Diabetes Program Recommendations:    Note elevated blood sugars upon admit.  CBG's improved with 70/30.  He did have mild low last PM, likely due to Novolog correction.  Note plans for potential d/c to SNF (which will assist with DM management as well).  Based on A1C, ? If he was taking insulin at home.  Will continue to follow blood sugars while in the hospital.    Thanks,  Adah Perl, RN, BC-ADM Inpatient Diabetes Coordinator Pager [phone] (8a-5p)

## 2018-07-17 NOTE — Consult Note (Signed)
Physical Medicine and Rehabilitation Consult   Reason for Consult: Stroke with functional deficits Referring Physician: Dr. Wyline Copas   HPI: Clayton Johnson is a 84 y.o. male with history of T2DM, HTN, obesity, multiple TIAs, dementia, bladder cancer; who was admitted on 07/15/2018 [facility] hospital with right PCA stroke with, large hemorrhagic transformation and a flutter.  Per chart review and family, reported headache/weakness with negative in the ED the day PTA and had progressive lethargy and fever. Stroke felt to be embolic due to A. fib with petechial hemorrhage and neurology recommended initiating holding Plavix and resuming anticoagulation in 2 weeks--plans for cardiac monitor after discharge follow-up CT head showed continued evolution of subacute hemorrhage right occipital lobe with mild localized edema and atrophy with advanced chronic microvascular disease.  Swallow evaluation showed cognitive-based dysphagia without overt signs of aspiration and dysphagia 3, thin liquids recommended.  Therapy evaluation revealed mild to moderate to severe cognitive deficits with ability to follow simple one-step command, visual deficits as well as balance deficits with mild posterior lean affecting overall functional status.  CIR recommended due to functional deficits.    Review of Systems  Unable to perform ROS: Mental acuity      Past Medical History:  Diagnosis Date  . Anxiety disorder   . Bladder cancer ([place])   . Coronary artery disease   . Degenerative arthritis   . Diabetes ([place])   . Dyslipidemia   . GERD (gastroesophageal reflux disease)   . Hypertension   . Obesity   . TIA (transient ischemic attack)    MULTIPLE,BIHEMISPHERIC    Past Surgical History:  Procedure Laterality Date  . BLADDER REPAIR    . CORONARY STENT PLACEMENT    . NASAL SINUS SURGERY    . SHOULDER OPEN ROTATOR CUFF REPAIR Right   . TOTAL KNEE ARTHROPLASTY Right     Family History  Problem Relation  Age of Onset  . Heart disease Mother   . Stroke Father   . Renal Disease Sister   . Heart attack Brother     Social History: Lives alone and independent prior to admission.  Goes out for meals and family question assist with housework.  He still drives.  He reports that he has never smoked. He has never used smokeless tobacco. He reports that he does not drink alcohol or use drugs.   Allergies  Allergen Reactions  . Lactose Intolerance (Gi) Other (See Comments)    Any dairy products make the patient's nasal passages VERY CONGESTED (is allergic)    Medications Prior to Admission  Medication Sig Dispense Refill  . acetaminophen (TYLENOL) 500 MG tablet Take 1,000 mg by mouth every 6 (six) hours as needed (for pain or headaches).    Marland Kitchen amLODipine (NORVASC) 5 MG tablet Take 5 mg by mouth daily.    Marland Kitchen aspirin EC 81 MG tablet Take 81 mg by mouth daily.     . cetirizine (ZYRTEC) 10 MG tablet Take 10 mg by mouth daily.    . clopidogrel (PLAVIX) 75 MG tablet Take 75 mg by mouth at bedtime.     . donepezil (ARICEPT) 10 MG tablet Take 10 mg by mouth at bedtime.    . finasteride (PROSCAR) 5 MG tablet Take 5 mg by mouth at bedtime.    . fluticasone (FLONASE) 50 MCG/ACT nasal spray Place 1-2 sprays into both nostrils daily.    . insulin glargine (LANTUS) 100 UNIT/ML injection Inject 50 Units into the skin daily before breakfast.    .  Insulin Isophane & Regular Human (NOVOLIN 70/30 FLEXPEN) (70-30) 100 UNIT/ML PEN Inject 20 Units into the skin See admin instructions. Inject 20 units into the skin with breakfast and 20 units with supper    . isosorbide mononitrate (IMDUR) 30 MG 24 hr tablet Take 30 mg by mouth daily.    . metoprolol tartrate (LOPRESSOR) 25 MG tablet Take 12.5 mg by mouth 2 (two) times daily.     . nitroGLYCERIN (NITROSTAT) 0.4 MG SL tablet Place 0.4 mg under the tongue every 5 (five) minutes as needed for chest pain.    . pravastatin (PRAVACHOL) 10 MG tablet Take 10 mg by mouth daily.      . quinapril (ACCUPRIL) 40 MG tablet Take 40 mg by mouth See admin instructions. Take 40 mg by mouth once a day and HOLD IF B/P IS <100/60    . tamsulosin (FLOMAX) 0.4 MG CAPS capsule Take 0.4 mg by mouth daily after supper.      Home: Home Living Family/patient expects to be discharged to:: Skilled nursing facility Living Arrangements: Alone Available Help at Discharge: Family, Available PRN/intermittently Type of Home: House Additional Comments: Pt has a hired caregiver   Functional History: Prior Function Level of Independence: Needs assistance Gait / Transfers Assistance Needed: Pt ambulated independently with no AD  ADL's / Homemaking Assistance Needed: Pt was able to perform ADLs mod I, but had a hired caregiver to ensure he did ADLs daily, took medications appropriately and ate appropriately  Comments: pt lived alone, drives, had some mild dementia Functional Status:  Mobility: Bed Mobility Overal bed mobility: Needs Assistance Bed Mobility: Supine to Sit Supine to sit: Mod assist General bed mobility comments: assist to initiate movement, to fully move LEs off the bed and to lift trunk from the bed  Transfers Overall transfer level: Needs assistance Equipment used: Rolling walker (2 wheeled), 1 person hand held assist Transfers: Sit to/from Stand, Stand Pivot Transfers Sit to Stand: Min assist Stand pivot transfers: Min assist, Mod assist General transfer comment: Min A to transfer to the Rt, and mod A and max multimodal cues to transfer to the Lt.   Ambulation/Gait Ambulation/Gait assistance: Mod assist Gait Distance (Feet): 100 Feet Assistive device: Rolling walker (2 wheeled), 1 person hand held assist Gait Pattern/deviations: Step-through pattern, Drifts right/left General Gait Details: pt running into objects on R>L, has difficulty correcting, prefers to try to push things out of his way. Pt able to maintain standing but having a lot of trouble navigating even in  open hallway. Tried HHA without RW because he didn't use one before and this was also difficult for him when he didn't see where he was going but as soon as he grasped the chair in his focus he continued to ambulate to it with only min A needed.  Gait velocity: decreased Gait velocity interpretation: <1.31 ft/sec, indicative of household ambulator    ADL: ADL Overall ADL's : Needs assistance/impaired Eating/Feeding: Maximal assistance Eating/Feeding Details (indicate cue type and reason): daughter reports pt with poor appetite so they have been assisting him with self feeding  Grooming: Wash/dry hands, Wash/dry face, Oral care, Brushing hair, Moderate assistance, Standing Upper Body Bathing: Minimal assistance, Sitting Lower Body Bathing: Moderate assistance, Sit to/from stand Upper Body Dressing : Maximal assistance, Sitting Lower Body Dressing: Moderate assistance, Sit to/from stand Lower Body Dressing Details (indicate cue type and reason): able to don/doff socks with cues repeated several times and cues to locate socks as he does not consistently scan  past midline  Toilet Transfer: Moderate assistance, Ambulation, Comfort height toilet, Grab bars, RW Toilet Transfer Details (indicate cue type and reason): Pt with significant difficulty making Lt hand turns - requires physical assist and max cues to complete a turn  Toileting- Clothing Manipulation and Hygiene: Moderate assistance, Sit to/from stand Functional mobility during ADLs: Moderate assistance, Rolling walker  Cognition: Cognition Overall Cognitive Status: Impaired/Different from baseline Arousal/Alertness: Awake/alert Orientation Level: Oriented to person, Oriented to place, Oriented to situation, Disoriented to time Attention: Sustained Sustained Attention: Impaired Sustained Attention Impairment: Verbal basic, Functional basic Memory: Impaired Memory Impairment: Decreased recall of new information(pt with poor recall of  details after 3-4 minutes) Awareness: Impaired Awareness Impairment: Intellectual impairment, Emergent impairment, Anticipatory impairment Problem Solving: Impaired Problem Solving Impairment: Functional basic(cues required to determine how to use urinal) Behaviors: Restless Safety/Judgment: Impaired Comments: pt attempting to get out of bed Cognition Arousal/Alertness: Awake/alert Behavior During Therapy: WFL for tasks assessed/performed Overall Cognitive Status: Impaired/Different from baseline Area of Impairment: Attention, Orientation, Memory, Following commands, Safety/judgement, Awareness, Problem solving Orientation Level: Disoriented to, Place, Time, Situation Current Attention Level: Sustained Memory: Decreased short-term memory Following Commands: Follows one step commands consistently, Follows one step commands inconsistently Safety/Judgement: Decreased awareness of safety, Decreased awareness of deficits Problem Solving: Slow processing, Decreased initiation, Difficulty sequencing, Requires verbal cues, Requires tactile cues General Comments: At times, pt required instructions to be repeated several times before he was able to initiate activity.      Blood pressure 139/87, pulse 65, temperature 98.4 F (36.9 C), temperature source Oral, resp. rate 17, height 5\' 8"  (1.727 m), weight 96.5 kg, SpO2 94 %. Physical Exam  Nursing note and vitals reviewed. Constitutional: He appears well-developed.  Obese  HENT:  Head: Normocephalic and atraumatic.  Eyes: Right eye exhibits no discharge. Left eye exhibits no discharge.  Does not move eyes to left field  Neck: Normal range of motion. Neck supple.  Cardiovascular: An irregular rhythm present.  Respiratory: Effort normal and breath sounds normal.  GI: Soft. Bowel sounds are normal.  Musculoskeletal:  No edema or tenderness in extremities  Neurological: He is alert.  Oriented x1 Sitting up in bed with right gaze preference  and left inattention.   Able to follow simple one-step commands.   Slow to initiate and has poor awareness of deficits.  Decrease in fine motor control bilateral upper extremities. Motor: LUE/LLE: 4/5 RUE/RLE: 4+/5  Skin: Skin is warm and dry.  Psychiatric: His affect is blunt. His speech is delayed. He is slowed.    Results for orders placed or performed during the hospital encounter of 07/14/18 (from the past 24 hour(s))  Glucose, capillary     Status: Abnormal   Collection Time: 07/16/18 11:28 AM  Result Value Ref Range   Glucose-Capillary 440 (H) 70 - 99 mg/dL  Glucose, capillary     Status: Abnormal   Collection Time: 07/16/18  4:17 PM  Result Value Ref Range   Glucose-Capillary 361 (H) 70 - 99 mg/dL  Glucose, capillary     Status: Abnormal   Collection Time: 07/16/18  9:25 PM  Result Value Ref Range   Glucose-Capillary 68 (L) 70 - 99 mg/dL   Comment 1 Notify RN    Comment 2 Document in Chart   Glucose, capillary     Status: Abnormal   Collection Time: 07/16/18  9:50 PM  Result Value Ref Range   Glucose-Capillary 69 (L) 70 - 99 mg/dL   Comment 1 Notify RN  Comment 2 Document in Chart   Glucose, capillary     Status: Abnormal   Collection Time: 07/16/18 10:25 PM  Result Value Ref Range   Glucose-Capillary 159 (H) 70 - 99 mg/dL   Comment 1 Notify RN    Comment 2 Document in Chart   Basic metabolic panel     Status: Abnormal   Collection Time: 07/17/18  4:40 AM  Result Value Ref Range   Sodium 141 135 - 145 mmol/L   Potassium 3.8 3.5 - 5.1 mmol/L   Chloride 104 98 - 111 mmol/L   CO2 25 22 - 32 mmol/L   Glucose, Bld 148 (H) 70 - 99 mg/dL   BUN 25 (H) 8 - 23 mg/dL   Creatinine, Ser 1.49 (H) 0.61 - 1.24 mg/dL   Calcium 8.7 (L) 8.9 - 10.3 mg/dL   GFR calc non Af Amer 42 (L) >60 mL/min   GFR calc Af Amer 49 (L) >60 mL/min   Anion gap 12 5 - 15  Glucose, capillary     Status: Abnormal   Collection Time: 07/17/18  6:19 AM  Result Value Ref Range   Glucose-Capillary  166 (H) 70 - 99 mg/dL   Comment 1 Notify RN    Comment 2 Document in Chart    No results found.  Assessment/Plan: Diagnosis: Right PCA stroke with hemorrhagic transformation  Labs and images (see above) independently reviewed.  Records reviewed and summated above. Stroke: Continue secondary stroke prophylaxis and Risk Factor Modification listed below:   Blood Pressure Management:  Continue current medication with prn's with permisive HTN per primary team Statin Agent:   Motor recovery: Fluoxetine  1. Does the need for close, 24 hr/day medical supervision in concert with the patient's rehab needs make it unreasonable for this patient to be served in a less intensive setting? Yes  2. Co-Morbidities requiring supervision/potential complications: dysphagia (advance diet as tolerated), uncontrolled T2 DM (Monitor in accordance with exercise and adjust meds as necessary), HTN (monitor and provide prns in accordance with increased physical exertion and pain), obesity (encourage weight loss), multiple TIAs, dementia, bladder cancer, CKD (avoid nephrotoxic meds) 3. Due to safety, disease management, medication administration and patient education, does the patient require 24 hr/day rehab nursing? Yes 4. Does the patient require coordinated care of a physician, rehab nurse, PT (1-2 hrs/day, 5 days/week), OT (1-2 hrs/day, 5 days/week) and SLP (1-2 hrs/day, 5 days/week) to address physical and functional deficits in the context of the above medical diagnosis(es)? Yes Addressing deficits in the following areas: balance, endurance, locomotion, strength, transferring, bowel/bladder control, bathing, dressing, toileting, cognition, speech, swallowing and psychosocial support 5. Can the patient actively participate in an intensive therapy program of at least 3 hrs of therapy per day at least 5 days per week? Potentially 6. The potential for patient to make measurable gains while on inpatient rehab is  excellent 7. Anticipated functional outcomes upon discharge from inpatient rehab are min assist  with PT, min assist with OT, min assist with SLP. 8. Estimated rehab length of stay to reach the above functional goals is: 16-19 days. 9. Anticipated D/C setting: Other 10. Anticipated post D/C treatments: HH therapy and Home excercise program 11. Overall Rehab/Functional Prognosis: good  RECOMMENDATIONS: This patient's condition is appropriate for continued rehabilitative care in the following setting: Will need to inquire about baseline cognitive functioning and family support at discharge (limited at present), if this changes, recommend CIR, otherwise agree with therapies, SNF with PM&R outpt follow up. Patient  has agreed to participate in recommended program. Potentially Note that insurance prior authorization may be required for reimbursement for recommended care.  Comment: Rehab Admissions Coordinator to follow up.   I have personally performed a face to face diagnostic evaluation, including, but not limited to relevant history and physical exam findings, of this patient and developed relevant assessment and plan.  Additionally, I have reviewed and concur with the physician assistant's documentation above.   Delice Lesch, MD, ABPMR Bary Leriche, PA-C 07/17/2018

## 2018-07-17 NOTE — Progress Notes (Signed)
  Speech Language Pathology Treatment: Dysphagia  Patient Details Name: Clayton Johnson MRN: [id number] DOB: [birth year] Today's Date: 07/17/2018 Time: 0922-0930 SLP Time Calculation (min) (ACUTE ONLY): 8 min  Assessment / Plan / Recommendation Clinical Impression  Skilled treatment session provided with Min A required for visual and sustained attention to cup and encouragement to consume thin liquids via straw with SLP. Pt consumed 6 oz thin liquids with no overt s/s of aspiration. Despite encouragement, pt refused any food items. Pt required repetition of questions and increased processing time to answer basic questions. Pt continues with confusion d/t place and situation with no awareness of deficits. Despite education, pt is not able to demonstrate retention/understanding of information. Continue to recommend SNF d/t continued confusion and inability to retain/comprehend information and has history of needing hired caregivers and even with that level of support pt often forgot to take medication. Would recommend more supervised medical setting than home with caregivers.    HPI HPI: Clayton Johnson is an 84 y.o. male past medical history dementia, diabetes, hypertension, bladder cancer, TIA, obesity who transferred from [facility] for management of atrial flutter and family request for an opinion with a neurologist for management of right PCA stroke      SLP Plan  Continue with current plan of care       Recommendations  Diet recommendations: Dysphagia 3 (mechanical soft);Thin liquid Liquids provided via: Straw Medication Administration: Whole meds with puree Supervision: Staff to assist with self feeding;Full supervision/cueing for compensatory strategies Compensations: Slow rate;Small sips/bites;Minimize environmental distractions Postural Changes and/or Swallow Maneuvers: Seated upright 90 degrees                Oral Care Recommendations: Oral care BID Follow up  Recommendations: Skilled Nursing facility SLP Visit Diagnosis: Dysphagia, unspecified (R13.10);Cognitive communication deficit (R41.841) Plan: Continue with current plan of care       [place] 07/17/2018, 11:32 AM

## 2018-07-17 NOTE — Progress Notes (Signed)
Physical Therapy Treatment Patient Details Name: Clayton Johnson MRN: [id number] DOB: 1934-06-18 Today's Date: 07/17/2018    History of Present Illness Clayton Johnson is an 84 y.o. male past medical history dementia, diabetes, hypertension, bladder cancer, TIA, obesity who transferred from [facility] for management of atrial flutter and family request for an opinion with a neurologist for management of right PCA stroke.    PT Comments    Clayton Johnson received in bed and agreeable to PT session. PT focusing on improving safe functional mobility and transfers. Patient with Left inattention throughout session as well as need for overall Mod A for mobility for safety and stability. Much time spent on scanning environment with poor L tracking. Will continue to follow acutely.     Follow Up Recommendations  SNF;Supervision/Assistance - 24 hour     Equipment Recommendations  None recommended by PT    Recommendations for Other Services       Precautions / Restrictions Precautions Precautions: Fall Precaution Comments: Lt spatial neglect and possible field deficit  Restrictions Weight Bearing Restrictions: No    Mobility  Bed Mobility Overal bed mobility: Needs Assistance Bed Mobility: Supine to Sit     Supine to sit: Min assist     General bed mobility comments: Min A for LE management to EOB adn trunk control; able to shift weight to scoot hips  Transfers Overall transfer level: Needs assistance Equipment used: Rolling walker (2 wheeled) Transfers: Sit to/from [facility] Sit to Stand: Min assist;Mod assist Stand pivot transfers: Min assist;Mod assist       General transfer comment: Min/Mod to power up from bedside and toilet; assist for stand pivot due to L inattention  Ambulation/Gait Ambulation/Gait assistance: Mod assist Gait Distance (Feet): (hallway ambulation) Assistive device: Rolling walker (2 wheeled) Gait Pattern/deviations:  Step-to pattern;Step-through pattern;Decreased stride length;Drifts right/left;Trunk flexed Gait velocity: decreased   General Gait Details: much time spent on scanning environment and safety awareness; noted L inattention with max cueing to look to L side for signs/room numbers. Reports vision difficulties   Stairs             Wheelchair Mobility    Modified Rankin (Stroke Patients Only)       Balance Overall balance assessment: Needs assistance Sitting-balance support: Feet supported Sitting balance-Leahy Scale: Good     Standing balance support: Bilateral upper extremity supported;During functional activity Standing balance-Leahy Scale: Poor                              Cognition Arousal/Alertness: Awake/alert Behavior During Therapy: WFL for tasks assessed/performed Overall Cognitive Status: Impaired/Different from baseline Area of Impairment: Attention;Memory;Following commands;Safety/judgement;Problem solving                   Current Attention Level: Sustained Memory: Decreased short-term memory Following Commands: Follows one step commands inconsistently;Follows one step commands with increased time Safety/Judgement: Decreased awareness of safety;Decreased awareness of deficits   Problem Solving: Slow processing;Decreased initiation;Difficulty sequencing;Requires verbal cues;Requires tactile cues General Comments: requires instructions several times as well as bringing attention to L side      Exercises      General Comments General comments (skin integrity, edema, etc.): Neice, Ginger, present for session; Left inattention throughout with R gaze preference even with cueing for forward gaze      Pertinent Vitals/Pain Pain Assessment: Faces Faces Pain Scale: No hurt    Home Living  Prior Function            PT Goals (current goals can now be found in the care plan section) Acute Rehab PT  Goals Patient Stated Goal: Pt unable to fully participate, but daughter is hopeful he will learn how to manage his ADLs and mobility in order to return home after rehab  PT Goal Formulation: With patient/family Time For Goal Achievement: 07/29/18 Potential to Achieve Goals: Good Progress towards PT goals: Progressing toward goals    Frequency    Min 3X/week      PT Plan Current plan remains appropriate    Co-evaluation              AM-PAC PT "6 Clicks" Daily Activity  Outcome Measure  Difficulty turning over in bed (including adjusting bedclothes, sheets and blankets)?: A Lot Difficulty moving from lying on back to sitting on the side of the bed? : Unable Difficulty sitting down on and standing up from a chair with arms (e.g., wheelchair, bedside commode, etc,.)?: Unable Help needed moving to and from a bed to chair (including a wheelchair)?: A Little Help needed walking in hospital room?: A Lot Help needed climbing 3-5 steps with a railing? : Total 6 Click Score: 10    End of Session Equipment Utilized During Treatment: Gait belt Activity Tolerance: Patient tolerated treatment well Patient left: in chair;with call bell/phone within reach;with chair alarm set;with family/visitor present Nurse Communication: Mobility status PT Visit Diagnosis: Unsteadiness on feet (R26.81);Other abnormalities of gait and mobility (R26.89);Muscle weakness (generalized) (M62.81);Pain     Time: 3149-7026 PT Time Calculation (min) (ACUTE ONLY): 33 min  Charges:  $Gait Training: 8-22 mins $Therapeutic Activity: 8-22 mins                    Lanney Gins, PT, DPT 07/17/18 4:01 PM Pager: [phone]

## 2018-07-17 NOTE — Progress Notes (Signed)
Inpatient Rehabilitation  Attempted to meet with patient at bedside to discuss team's recommendation for IP Rehab.  However, patient unable to participate in conversation.  I called daughter, Lenna Sciara via phone to discuss our program, anticipated length of stay, as well as expected outcomes and necessary caregiver assist.  She asked questions about insurance coverage for IP Rehab and then SNF and our letter was verbally shared with her.  She appreciated that questions were answered and stated that they need longer term rehab.  Will sign off at this time and CSW notified of need to follow up with family for SNF options.  Daughter stated she would be here today at 3:30-4pm.   Call if questions.    Carmelia Roller., CCC/SLP Admission Coordinator  [place]  Cell [phone]

## 2018-07-18 DIAGNOSIS — Z66 Do not resuscitate: Secondary | ICD-10-CM | POA: Diagnosis not present

## 2018-07-18 DIAGNOSIS — G309 Alzheimer's disease, unspecified: Secondary | ICD-10-CM | POA: Diagnosis not present

## 2018-07-18 DIAGNOSIS — I129 Hypertensive chronic kidney disease with stage 1 through stage 4 chronic kidney disease, or unspecified chronic kidney disease: Secondary | ICD-10-CM | POA: Diagnosis not present

## 2018-07-18 DIAGNOSIS — E1122 Type 2 diabetes mellitus with diabetic chronic kidney disease: Secondary | ICD-10-CM | POA: Diagnosis not present

## 2018-07-18 DIAGNOSIS — I10 Essential (primary) hypertension: Secondary | ICD-10-CM | POA: Diagnosis not present

## 2018-07-18 DIAGNOSIS — I471 Supraventricular tachycardia: Secondary | ICD-10-CM | POA: Diagnosis not present

## 2018-07-18 DIAGNOSIS — I679 Cerebrovascular disease, unspecified: Secondary | ICD-10-CM | POA: Diagnosis not present

## 2018-07-18 DIAGNOSIS — I2510 Atherosclerotic heart disease of native coronary artery without angina pectoris: Secondary | ICD-10-CM | POA: Diagnosis not present

## 2018-07-18 DIAGNOSIS — R1310 Dysphagia, unspecified: Secondary | ICD-10-CM | POA: Diagnosis not present

## 2018-07-18 DIAGNOSIS — Z7982 Long term (current) use of aspirin: Secondary | ICD-10-CM | POA: Diagnosis not present

## 2018-07-18 DIAGNOSIS — Z7902 Long term (current) use of antithrombotics/antiplatelets: Secondary | ICD-10-CM | POA: Diagnosis not present

## 2018-07-18 DIAGNOSIS — E785 Hyperlipidemia, unspecified: Secondary | ICD-10-CM | POA: Diagnosis not present

## 2018-07-18 DIAGNOSIS — Z8673 Personal history of transient ischemic attack (TIA), and cerebral infarction without residual deficits: Secondary | ICD-10-CM | POA: Diagnosis not present

## 2018-07-18 DIAGNOSIS — N183 Chronic kidney disease, stage 3 (moderate): Secondary | ICD-10-CM | POA: Diagnosis not present

## 2018-07-18 DIAGNOSIS — I4892 Unspecified atrial flutter: Secondary | ICD-10-CM | POA: Diagnosis not present

## 2018-07-18 DIAGNOSIS — E1169 Type 2 diabetes mellitus with other specified complication: Secondary | ICD-10-CM | POA: Diagnosis not present

## 2018-07-18 DIAGNOSIS — Z955 Presence of coronary angioplasty implant and graft: Secondary | ICD-10-CM | POA: Diagnosis not present

## 2018-07-18 DIAGNOSIS — R29702 NIHSS score 2: Secondary | ICD-10-CM | POA: Diagnosis not present

## 2018-07-18 DIAGNOSIS — I6330 Cerebral infarction due to thrombosis of unspecified cerebral artery: Secondary | ICD-10-CM | POA: Diagnosis not present

## 2018-07-18 DIAGNOSIS — I69391 Dysphagia following cerebral infarction: Secondary | ICD-10-CM | POA: Diagnosis not present

## 2018-07-18 DIAGNOSIS — R2689 Other abnormalities of gait and mobility: Secondary | ICD-10-CM | POA: Diagnosis not present

## 2018-07-18 DIAGNOSIS — I482 Chronic atrial fibrillation: Secondary | ICD-10-CM | POA: Diagnosis not present

## 2018-07-18 DIAGNOSIS — Z8551 Personal history of malignant neoplasm of bladder: Secondary | ICD-10-CM | POA: Diagnosis not present

## 2018-07-18 DIAGNOSIS — E669 Obesity, unspecified: Secondary | ICD-10-CM | POA: Diagnosis not present

## 2018-07-18 DIAGNOSIS — E871 Hypo-osmolality and hyponatremia: Secondary | ICD-10-CM | POA: Diagnosis not present

## 2018-07-18 DIAGNOSIS — K219 Gastro-esophageal reflux disease without esophagitis: Secondary | ICD-10-CM | POA: Diagnosis not present

## 2018-07-18 LAB — BASIC METABOLIC PANEL
ANION GAP: 8 (ref 5–15)
BUN: 22 mg/dL (ref 8–23)
CO2: 24 mmol/L (ref 22–32)
Calcium: 8.3 mg/dL — ABNORMAL LOW (ref 8.9–10.3)
Chloride: 106 mmol/L (ref 98–111)
Creatinine, Ser: 1.54 mg/dL — ABNORMAL HIGH (ref 0.61–1.24)
GFR calc non Af Amer: 41 mL/min — ABNORMAL LOW (ref >60)
GFR, EST AFRICAN AMERICAN: 47 mL/min — AB (ref >60)
Glucose, Bld: 199 mg/dL — ABNORMAL HIGH (ref 70–99)
Potassium: 3.6 mmol/L (ref 3.5–5.1)
SODIUM: 138 mmol/L (ref 135–145)

## 2018-07-18 LAB — GLUCOSE, CAPILLARY
GLUCOSE-CAPILLARY: 213 mg/dL — AB (ref 70–99)
Glucose-Capillary: 180 mg/dL — ABNORMAL HIGH (ref 70–99)
Glucose-Capillary: 252 mg/dL — ABNORMAL HIGH (ref 70–99)
Glucose-Capillary: 65 mg/dL — ABNORMAL LOW (ref 70–99)

## 2018-07-18 LAB — CBC
HCT: 35.5 % — ABNORMAL LOW (ref 39.0–52.0)
HEMOGLOBIN: 11.6 g/dL — AB (ref 13.0–17.0)
MCH: 31 pg (ref 26.0–34.0)
MCHC: 32.7 g/dL (ref 30.0–36.0)
MCV: 94.9 fL (ref 78.0–100.0)
Platelets: 238 10*3/uL (ref 150–400)
RBC: 3.74 MIL/uL — AB (ref 4.22–5.81)
RDW: 13.1 % (ref 11.5–15.5)
WBC: 8.1 10*3/uL (ref 4.0–10.5)

## 2018-07-18 MED ORDER — INSULIN ASPART PROT & ASPART (70-30 MIX) 100 UNIT/ML ~~LOC~~ SUSP
25.0000 [IU] | Freq: Two times a day (BID) | SUBCUTANEOUS | Status: DC
  Administered 2018-07-18 – 2018-07-19 (×2): 25 [IU] via SUBCUTANEOUS

## 2018-07-18 MED ORDER — INSULIN ASPART PROT & ASPART (70-30 MIX) 100 UNIT/ML ~~LOC~~ SUSP: 25.0000 [IU] | Freq: Two times a day (BID) | SUBCUTANEOUS | 11 refills | Status: DC

## 2018-07-18 MED ORDER — CEFDINIR 300 MG PO CAPS
300.0000 mg | ORAL_CAPSULE | Freq: Two times a day (BID) | ORAL | Status: DC
  Administered 2018-07-18 – 2018-07-19 (×3): 300 mg via ORAL
  Filled 2018-07-18 (×4): qty 1

## 2018-07-18 MED ORDER — METOPROLOL TARTRATE 25 MG PO TABS: 25.0000 mg | ORAL_TABLET | Freq: Two times a day (BID) | ORAL | 0 refills | Status: DC

## 2018-07-18 MED ORDER — POTASSIUM CHLORIDE CRYS ER 20 MEQ PO TBCR
40.0000 meq | EXTENDED_RELEASE_TABLET | Freq: Once | ORAL | Status: AC
  Administered 2018-07-18: 40 meq via ORAL
  Filled 2018-07-18: qty 2

## 2018-07-18 MED ORDER — CEFDINIR 300 MG PO CAPS: 300.0000 mg | ORAL_CAPSULE | Freq: Two times a day (BID) | ORAL | 0 refills | Status: AC

## 2018-07-18 NOTE — Discharge Summary (Addendum)
Physician Discharge Summary  Clayton Johnson TDS:[id number] DOB: 06-13-1934 DOA: 07/14/2018  PCP: Ronita Hipps, MD  Admit date: 07/14/2018 Discharge date: 07/18/2018  Admitted From: [facility] as hospital to hospital transfer Disposition:  SNF  Recommendations for Outpatient Follow-up:  1. Follow up with PCP in 1-2 weeks 2. Follow up with Cardiology, recommend re-establish with Dr. Marlin Canary 3. Recommendation for 14 day ZIO patch heart monitor. If afib on monitor, then recommendation to start Eliquis no sooner than 8/25, otherwise continue aspirin as ordered 4. Complete 5 days of omnicef 5. Recommend outpatient 2d echo  Discharge Condition:Improved CODE STATUS:DNR Diet recommendation: Dysphagia 3 with thin liquids   Brief/Interim Summary: 84 y.o.malewith medical history significant ofcoronary artery disease, hypertension, dyslipidemia, diabetes transferred here from [facility] for neurology evaluation. Patient has had a full neurology work-up at [facility] showing acute CVA left right occipital with a flutter. Patient has been getting physical therapy was in the process of being discharged to rehab apparently went into SVT at some point last night. Currently rate is 70s. His beta-blocker was increased today. Patient transferred here due to patient family requesting to be seen by a neurologist face-to-face instead of through telemetry neurology. Patient is only complaint is a headache at this point.  Cerebrovascular disease, unspecified-acute nonhemorrhagic stroke in the right occipital lobe.  -Patient continued with aspirin -Plavix on hold per Neurology -Continue on monitor, see below regarding anticoagulation -Therapy has recommended SNF  Diabetes (HCC)-poorly controlled. - Will ensure a diabetic diet - SSI increased to moderate scale while in hospital - Med-rec reviewed. Both lantus and 70-30 listed on med-rec - Continue with 70-30 at 20 units BID per home  regimen for now with improvement in blood glucose. Continue to titrate insulin to achieve euglycemia  Hypertension- -BP stable at this time.  - Continue current regimen as tolerated  Anxiety disorder - Presently stable at this time  Paroxysmal SVT (supraventricular tachycardia) ([place])- -continued on 25mg  metoprolol bid. -HR remains stable in the 70-90's -Records from [facility] obtained and closely reviewed. Patient was seen by Cardiology, Dr. Raynelle Dick, who did notice rapid narrow complex rhythm while on monitor with concern for the possibility of flutter. Recommendations were made for 14 day heart monitor on discharge. Cardiology at [facility] was aware of plan to cont to hold anticoagulation per Neurology -At time of d/c, noted to have self-limiting wide complex rhythm. K of 3.6 noted. Will replace. EKG ordered and reviewed. NSR at present. Discussed with Cardiology with recommendations for close outpatient follow up with event monitor as already addressed. Also recommendation for outpatient echo  Discharge Diagnoses:  Principal Problem:   Cerebrovascular disease, unspecified Active Problems:   Diabetes ([place])   Hypertension   Anxiety disorder   Paroxysmal SVT (supraventricular tachycardia) (HCC)   Cerebral thrombosis with cerebral infarction   Dysphagia, post-stroke   Diabetes mellitus type 2 in obese ([place])   History of TIAs   Dementia without behavioral disturbance   Malignant neoplasm of urinary bladder (HCC)   Stage 3 chronic kidney disease [facility])    Discharge Instructions  Discharge Instructions    Ambulatory referral to Physical Medicine Rehab   Complete by:  As directed    1 month follow up for stroke     Allergies as of 07/18/2018      Reactions   Lactose Intolerance (gi) Other (See Comments)   Any dairy products make the patient's nasal passages VERY CONGESTED (is allergic)      Medication List  STOP taking these medications   amLODipine 5 MG  tablet Commonly known as:  NORVASC   clopidogrel 75 MG tablet Commonly known as:  PLAVIX   isosorbide mononitrate 30 MG 24 hr tablet Commonly known as:  IMDUR   LANTUS 100 UNIT/ML injection Generic drug:  insulin glargine   nitroGLYCERIN 0.4 MG SL tablet Commonly known as:  NITROSTAT   NOVOLIN 70/30 FLEXPEN (70-30) 100 UNIT/ML PEN Generic drug:  Insulin Isophane & Regular Human   quinapril 40 MG tablet Commonly known as:  ACCUPRIL     TAKE these medications   acetaminophen 500 MG tablet Commonly known as:  TYLENOL Take 1,000 mg by mouth every 6 (six) hours as needed (for pain or headaches).   aspirin EC 81 MG tablet Take 81 mg by mouth daily.   cefdinir 300 MG capsule Commonly known as:  OMNICEF Take 1 capsule (300 mg total) by mouth every 12 (twelve) hours for 5 days.   cetirizine 10 MG tablet Commonly known as:  ZYRTEC Take 10 mg by mouth daily.   donepezil 10 MG tablet Commonly known as:  ARICEPT Take 10 mg by mouth at bedtime.   finasteride 5 MG tablet Commonly known as:  PROSCAR Take 5 mg by mouth at bedtime.   fluticasone 50 MCG/ACT nasal spray Commonly known as:  FLONASE Place 1-2 sprays into both nostrils daily.   insulin aspart protamine- aspart (70-30) 100 UNIT/ML injection Commonly known as:  NOVOLOG MIX 70/30 Inject 0.25 mLs (25 Units total) into the skin 2 (two) times daily with a meal.   metoprolol tartrate 25 MG tablet Commonly known as:  LOPRESSOR Take 1 tablet (25 mg total) by mouth 2 (two) times daily. What changed:  how much to take   pravastatin 10 MG tablet Commonly known as:  PRAVACHOL Take 10 mg by mouth daily.   tamsulosin 0.4 MG Caps capsule Commonly known as:  FLOMAX Take 0.4 mg by mouth daily after supper.      Contact information for after-discharge care    Destination    [facility] .   Service:  Skilled Nursing Contact information: [place]  [phone]             Allergies  Allergen Reactions  . Lactose Intolerance (Gi) Other (See Comments)    Any dairy products make the patient's nasal passages VERY CONGESTED (is allergic)    Consultations:  Neurology  Procedures/Studies: Ct Head Wo Contrast  Result Date: 07/15/2018 CLINICAL DATA:  Follow-up examination for acute stroke. EXAM: CT HEAD WITHOUT CONTRAST TECHNIQUE: Contiguous axial images were obtained from the base of the skull through the vertex without intravenous contrast. COMPARISON:  Prior CT from 07/11/2018 FINDINGS: Brain: Atrophy with chronic small vessel ischemic disease. Scatter remote right basal ganglia and bilateral thalamic lacunar infarcts again noted. Few scattered remote cerebellar infarcts. Evolving subacute right occipital infarct again seen, relatively stable in size and distribution. Mildly increased localized edema without significant regional mass effect. Associated hemorrhage again seen, better appreciated on previous MRI. No other new acute intracranial infarct. No intracranial hemorrhage. No midline shift or mass effect. Ventricular prominence related global parenchymal volume loss of hydrocephalus. No extra-axial fluid collection. Vascular: No hyperdense vessel. Scattered vascular calcifications noted within the carotid siphons. Skull: Scalp soft tissues and calvarium demonstrate no acute abnormality. Sinuses/Orbits: Globes normal soft tissues within normal limits. Other: Mild scattered mucosal thickening within the ethmoidal air cells. Paranasal sinuses and mastoid air cells  are otherwise clear. IMPRESSION: 1. Continued interval evolution of subacute hemorrhagic right occipital lobe infarct. Mild localized edema without significant regional mass effect. Fairly mild hemorrhage visible by CT, better appreciated on recent MRI. No other complication. 2. No other new acute intracranial abnormality. 3. Atrophy with advanced chronic microvascular  ischemic disease. Electronically Signed   By: Jeannine Boga M.D.   On: 07/15/2018 04:24    Subjective: Without complaints  Discharge Exam: Vitals:   07/18/18 0426 07/18/18 0824  BP: (!) 157/56 134/82  Pulse: 66 75  Resp: 20   Temp: 98.6 F (37 C) 98.7 F (37.1 C)  SpO2: 99% 96%   Vitals:   07/17/18 1955 07/17/18 2352 07/18/18 0426 07/18/18 0824  BP: (!) 144/84 (!) 145/78 (!) 157/56 134/82  Pulse: 75 69 66 75  Resp: 20 20 20    Temp: 99.1 F (37.3 C) 98.6 F (37 C) 98.6 F (37 C) 98.7 F (37.1 C)  TempSrc: Oral Oral Oral Oral  SpO2: 95% 95% 99% 96%  Weight:      Height:        General: Pt is alert, awake, not in acute distress Cardiovascular: RRR, S1/S2 +, no rubs, no gallops Respiratory: CTA bilaterally, no wheezing, no rhonchi Abdominal: Soft, NT, ND, bowel sounds + Extremities: no edema, no cyanosis   The results of significant diagnostics from this hospitalization (including imaging, microbiology, ancillary and laboratory) are listed below for reference.     Microbiology: No results found for this or any previous visit (from the past 240 hour(s)).   Labs: BNP (last 3 results) No results for input(s): BNP in the last 8760 hours. Basic Metabolic Panel: Recent Labs  Lab 07/15/18 0456 07/16/18 0332 07/17/18 0440 07/18/18 0437  NA  --  139 141 138  K  --  3.9 3.8 3.6  CL  --  105 104 106  CO2  --  23 25 24   GLUCOSE  --  297* 148* 199*  BUN  --  20 25* 22  CREATININE  --  1.55* 1.49* 1.54*  CALCIUM  --  8.7* 8.7* 8.3*  MG 2.0 2.1  --   --    Liver Function Tests: No results for input(s): AST, ALT, ALKPHOS, BILITOT, PROT, ALBUMIN in the last 168 hours. No results for input(s): LIPASE, AMYLASE in the last 168 hours. No results for input(s): AMMONIA in the last 168 hours. CBC: Recent Labs  Lab 07/18/18 0437  WBC 8.1  HGB 11.6*  HCT 35.5*  MCV 94.9  PLT 238   Cardiac Enzymes: No results for input(s): CKTOTAL, CKMB, CKMBINDEX, TROPONINI  in the last 168 hours. BNP: Invalid input(s): POCBNP CBG: Recent Labs  Lab 07/17/18 0619 07/17/18 1142 07/17/18 1643 07/17/18 2200 07/18/18 0624  GLUCAP 166* 216* 236* 232* 213*   D-Dimer No results for input(s): DDIMER in the last 72 hours. Hgb A1c No results for input(s): HGBA1C in the last 72 hours. Lipid Profile No results for input(s): CHOL, HDL, LDLCALC, TRIG, CHOLHDL, LDLDIRECT in the last 72 hours. Thyroid function studies No results for input(s): TSH, T4TOTAL, T3FREE, THYROIDAB in the last 72 hours.  Invalid input(s): FREET3 Anemia work up No results for input(s): VITAMINB12, FOLATE, FERRITIN, TIBC, IRON, RETICCTPCT in the last 72 hours. Urinalysis    Component Value Date/Time   COLORURINE YELLOW 07/17/2018 1609   APPEARANCEUR CLEAR 07/17/2018 1609   LABSPEC 1.015 07/17/2018 1609   PHURINE 5.0 07/17/2018 1609   GLUCOSEU >=500 (A) 07/17/2018 1609   HGBUR MODERATE (  A) 07/17/2018 1609   BILIRUBINUR NEGATIVE 07/17/2018 [place] 07/17/2018 1609   PROTEINUR NEGATIVE 07/17/2018 1609   UROBILINOGEN 0.2 10/09/2007 1410   NITRITE NEGATIVE 07/17/2018 1609   LEUKOCYTESUR LARGE (A) 07/17/2018 1609   Sepsis Labs Invalid input(s): PROCALCITONIN,  WBC,  LACTICIDVEN Microbiology No results found for this or any previous visit (from the past 240 hour(s)).  Time spent: 54min  SIGNED:   Marylu Lund, MD  [facility] 07/18/2018, 11:23 AM  If 7PM-7AM, please contact night-coverage [email or web address] Password [email or web address]

## 2018-07-18 NOTE — Care Management Obs Status (Signed)
[place] NOTIFICATION   Patient Details  Name: Clayton Johnson MRN: [id number] Date of Birth: Sep 23, 1934   Medicare Observation Status Notification Given:  Yes    Pollie Friar, RN 07/18/2018, 10:14 AM

## 2018-07-18 NOTE — Progress Notes (Signed)
Occupational Therapy Treatment Patient Details Name: Clayton Johnson MRN: [id number] DOB: 03-21-1934 Today's Date: 07/18/2018    History of present illness Clayton Johnson is an 84 y.o. male past medical history dementia, diabetes, hypertension, bladder cancer, TIA, obesity who transferred from [facility] for management of atrial flutter and family request for an opinion with a neurologist for management of right PCA stroke.   OT comments  Pt making great progress toward OT goals this session. Pt completed all functional mobility with RW and min A, progressing to CGA. Pt required several vc for task initiation throughout session and demo poor ST memory. SNF d/c remains appropriate.   Follow Up Recommendations  SNF;Supervision/Assistance - 24 hour    Equipment Recommendations  None recommended by OT    Recommendations for Other Services      Precautions / Restrictions Precautions Precautions: Fall Precaution Comments: Lt spatial neglect and possible field deficit  Restrictions Weight Bearing Restrictions: No       Mobility Bed Mobility Overal bed mobility: Needs Assistance Bed Mobility: Supine to Sit     Supine to sit: Min assist     General bed mobility comments: min lifting assistance   Transfers Overall transfer level: Needs assistance Equipment used: Rolling walker (2 wheeled) Transfers: Sit to/from [facility] Sit to Stand: Min assist Stand pivot transfers: Min assist            Balance Overall balance assessment: Needs assistance Sitting-balance support: Feet supported;No upper extremity supported Sitting balance-Leahy Scale: Good     Standing balance support: Bilateral upper extremity supported;During functional activity Standing balance-Leahy Scale: Poor Standing balance comment: reliant on UE support                            ADL either performed or assessed with clinical judgement   ADL Overall ADL's :  Needs assistance/impaired     Grooming: Oral care;Brushing hair;Minimal assistance;Sitting                               Functional mobility during ADLs: Minimal assistance;Rolling walker General ADL Comments: functional mobility to sink     Vision       Perception     Praxis      Cognition Arousal/Alertness: Lethargic Behavior During Therapy: Flat affect Overall Cognitive Status: Impaired/Different from baseline Area of Impairment: Attention;Memory;Following commands;Safety/judgement;Awareness;Problem solving;Orientation                 Orientation Level: Disoriented to;Place;Time;Situation Current Attention Level: Sustained Memory: Decreased short-term memory Following Commands: Follows one step commands inconsistently;Follows one step commands with increased time Safety/Judgement: Decreased awareness of safety;Decreased awareness of deficits Awareness: Intellectual Problem Solving: Slow processing;Decreased initiation;Difficulty sequencing;Requires verbal cues;Requires tactile cues General Comments: requires instructions several times as well as bringing attention to L side        Exercises     Shoulder Instructions       General Comments pt c/o itchy skin throughout session and vigorously itching head/back    Pertinent Vitals/ Pain       Pain Assessment: No/denies pain  Home Living                                          Prior Functioning/Environment  Frequency  Min 2X/week        Progress Toward Goals  OT Goals(current goals can now be found in the care plan section)  Progress towards OT goals: Progressing toward goals  Acute Rehab OT Goals Patient Stated Goal: none stated  OT Goal Formulation: With patient  Plan Discharge plan remains appropriate    Co-evaluation                 AM-PAC PT "6 Clicks" Daily Activity     Outcome Measure   Help from another person eating meals?: A  Little Help from another person taking care of personal grooming?: A Little Help from another person toileting, which includes using toliet, bedpan, or urinal?: A Little Help from another person bathing (including washing, rinsing, drying)?: A Little Help from another person to put on and taking off regular upper body clothing?: A Little Help from another person to put on and taking off regular lower body clothing?: A Little 6 Click Score: 18    End of Session Equipment Utilized During Treatment: Rolling walker;Gait belt  OT Visit Diagnosis: Unsteadiness on feet (R26.81);Cognitive communication deficit (R41.841) Symptoms and signs involving cognitive functions: Cerebral infarction   Activity Tolerance Patient tolerated treatment well   Patient Left in bed;with call bell/phone within reach;with bed alarm set   Nurse Communication Mobility status        Time: 9924-2683 OT Time Calculation (min): 28 min  Charges: OT General Charges $OT Visit: 1 Visit OT Treatments $Self Care/Home Management : 23-37 mins    Curtis Sites OTR/L 07/18/2018, 3:03 PM

## 2018-07-18 NOTE — Progress Notes (Signed)
Insurance approval still not obtained for patient to admit to SNF today. CSW alerted MD.  CSW to follow.  Laveda Abbe, [place] Clinical Social Worker [phone]

## 2018-07-18 NOTE — Care Management Note (Signed)
Case Management Note  Patient Details  Name: Clayton Johnson MRN: [id number] Date of Birth: [birth year]  Subjective/Objective:     Pt in with cerebrovascular disease. He is from home alone. Pt states he has a walker but is not using it. Pt states he was driving prior to hospitalization and had no issues obtaining his meds.  PCP:   Dr Helene Kelp Insurance: [facility] medicare             Action/Plan: Recommendation are for SNF. CM following for d/c disposition.  Expected Discharge Date:                  Expected Discharge Plan:  Skilled Nursing Facility  In-House Referral:  Clinical Social Work  Discharge planning Services     Post Acute Care Choice:    Choice offered to:     DME Arranged:    DME Agency:     HH Arranged:    [place] Agency:     Status of Service:  Completed, signed off  If discussed at [employer], dates discussed:    Additional Comments:  Pollie Friar, RN 07/18/2018, 10:55 AM

## 2018-07-18 NOTE — Progress Notes (Signed)
CSW following for discharge plan. CSW met with patient's son at bedside to confirm that [facility] in [place] was able to accept the patient. Patient's family would like to choose them. CSW contacted facility and asked them to initiate insurance authorization request through [facility].  CSW to follow.  Laveda Abbe, [place] Clinical Social Worker [phone]

## 2018-07-19 DIAGNOSIS — E871 Hypo-osmolality and hyponatremia: Secondary | ICD-10-CM | POA: Diagnosis not present

## 2018-07-19 DIAGNOSIS — I10 Essential (primary) hypertension: Secondary | ICD-10-CM | POA: Diagnosis not present

## 2018-07-19 DIAGNOSIS — E785 Hyperlipidemia, unspecified: Secondary | ICD-10-CM | POA: Diagnosis not present

## 2018-07-19 DIAGNOSIS — Z7982 Long term (current) use of aspirin: Secondary | ICD-10-CM | POA: Diagnosis not present

## 2018-07-19 DIAGNOSIS — E1169 Type 2 diabetes mellitus with other specified complication: Secondary | ICD-10-CM | POA: Diagnosis not present

## 2018-07-19 DIAGNOSIS — E1122 Type 2 diabetes mellitus with diabetic chronic kidney disease: Secondary | ICD-10-CM | POA: Diagnosis not present

## 2018-07-19 DIAGNOSIS — N183 Chronic kidney disease, stage 3 (moderate): Secondary | ICD-10-CM | POA: Diagnosis not present

## 2018-07-19 DIAGNOSIS — I4892 Unspecified atrial flutter: Secondary | ICD-10-CM | POA: Diagnosis not present

## 2018-07-19 DIAGNOSIS — I471 Supraventricular tachycardia: Secondary | ICD-10-CM | POA: Diagnosis not present

## 2018-07-19 DIAGNOSIS — G309 Alzheimer's disease, unspecified: Secondary | ICD-10-CM | POA: Diagnosis not present

## 2018-07-19 DIAGNOSIS — I2510 Atherosclerotic heart disease of native coronary artery without angina pectoris: Secondary | ICD-10-CM | POA: Diagnosis not present

## 2018-07-19 DIAGNOSIS — I6350 Cerebral infarction due to unspecified occlusion or stenosis of unspecified cerebral artery: Secondary | ICD-10-CM | POA: Diagnosis not present

## 2018-07-19 DIAGNOSIS — R2689 Other abnormalities of gait and mobility: Secondary | ICD-10-CM | POA: Diagnosis not present

## 2018-07-19 DIAGNOSIS — R29702 NIHSS score 2: Secondary | ICD-10-CM | POA: Diagnosis not present

## 2018-07-19 DIAGNOSIS — I639 Cerebral infarction, unspecified: Secondary | ICD-10-CM | POA: Diagnosis not present

## 2018-07-19 DIAGNOSIS — E782 Mixed hyperlipidemia: Secondary | ICD-10-CM | POA: Diagnosis not present

## 2018-07-19 DIAGNOSIS — R062 Wheezing: Secondary | ICD-10-CM | POA: Diagnosis not present

## 2018-07-19 DIAGNOSIS — I6330 Cerebral infarction due to thrombosis of unspecified cerebral artery: Secondary | ICD-10-CM | POA: Diagnosis not present

## 2018-07-19 DIAGNOSIS — I129 Hypertensive chronic kidney disease with stage 1 through stage 4 chronic kidney disease, or unspecified chronic kidney disease: Secondary | ICD-10-CM | POA: Diagnosis not present

## 2018-07-19 DIAGNOSIS — I679 Cerebrovascular disease, unspecified: Secondary | ICD-10-CM | POA: Diagnosis not present

## 2018-07-19 DIAGNOSIS — R1310 Dysphagia, unspecified: Secondary | ICD-10-CM | POA: Diagnosis not present

## 2018-07-19 DIAGNOSIS — M25532 Pain in left wrist: Secondary | ICD-10-CM | POA: Diagnosis not present

## 2018-07-19 DIAGNOSIS — Z7902 Long term (current) use of antithrombotics/antiplatelets: Secondary | ICD-10-CM | POA: Diagnosis not present

## 2018-07-19 DIAGNOSIS — R002 Palpitations: Secondary | ICD-10-CM | POA: Diagnosis not present

## 2018-07-19 DIAGNOSIS — Z794 Long term (current) use of insulin: Secondary | ICD-10-CM | POA: Diagnosis not present

## 2018-07-19 DIAGNOSIS — I69391 Dysphagia following cerebral infarction: Secondary | ICD-10-CM | POA: Diagnosis not present

## 2018-07-19 DIAGNOSIS — K219 Gastro-esophageal reflux disease without esophagitis: Secondary | ICD-10-CM | POA: Diagnosis not present

## 2018-07-19 DIAGNOSIS — T7840XD Allergy, unspecified, subsequent encounter: Secondary | ICD-10-CM | POA: Diagnosis not present

## 2018-07-19 DIAGNOSIS — C679 Malignant neoplasm of bladder, unspecified: Secondary | ICD-10-CM | POA: Diagnosis not present

## 2018-07-19 DIAGNOSIS — Z8551 Personal history of malignant neoplasm of bladder: Secondary | ICD-10-CM | POA: Diagnosis not present

## 2018-07-19 DIAGNOSIS — Z66 Do not resuscitate: Secondary | ICD-10-CM | POA: Diagnosis not present

## 2018-07-19 DIAGNOSIS — Z8673 Personal history of transient ischemic attack (TIA), and cerebral infarction without residual deficits: Secondary | ICD-10-CM | POA: Diagnosis not present

## 2018-07-19 DIAGNOSIS — Z955 Presence of coronary angioplasty implant and graft: Secondary | ICD-10-CM | POA: Diagnosis not present

## 2018-07-19 DIAGNOSIS — I482 Chronic atrial fibrillation: Secondary | ICD-10-CM | POA: Diagnosis not present

## 2018-07-19 DIAGNOSIS — I4891 Unspecified atrial fibrillation: Secondary | ICD-10-CM | POA: Diagnosis not present

## 2018-07-19 LAB — GLUCOSE, CAPILLARY
GLUCOSE-CAPILLARY: 158 mg/dL — AB (ref 70–99)
GLUCOSE-CAPILLARY: 176 mg/dL — AB (ref 70–99)
GLUCOSE-CAPILLARY: 217 mg/dL — AB (ref 70–99)
GLUCOSE-CAPILLARY: 267 mg/dL — AB (ref 70–99)

## 2018-07-19 NOTE — Progress Notes (Signed)
Physical Therapy Treatment Patient Details Name: Clayton Johnson MRN: [id number] DOB: Sep 04, 1934 Today's Date: 07/19/2018    History of Present Illness Sirron Francesconi is an 84 y.o. male past medical history dementia, diabetes, hypertension, bladder cancer, TIA, obesity who transferred from [facility] for management of atrial flutter and family request for an opinion with a neurologist for management of right PCA stroke.    PT Comments    Patient today with inconsistent visual gaze preference. At times with strong L gaze preference even with eye blocking and max cueing for R gaze. Continues to require physical assist for transfers and mobility for stability and safety. PT recs remain appropriate.     Follow Up Recommendations  SNF;Supervision/Assistance - 24 hour     Equipment Recommendations  None recommended by PT    Recommendations for Other Services       Precautions / Restrictions Precautions Precautions: Fall Precaution Comments: Lt spatial neglect and possible field deficit  Restrictions Weight Bearing Restrictions: No    Mobility  Bed Mobility               General bed mobility comments: up in recliner upon PT arrival  Transfers Overall transfer level: Needs assistance Equipment used: Rolling walker (2 wheeled) Transfers: Sit to/from [facility] Sit to Stand: Min assist Stand pivot transfers: Min assist       General transfer comment: Min/Mod to power up from recliner - max cueing for hand placement prior to and after transfer   Ambulation/Gait Ambulation/Gait assistance: Min assist Gait Distance (Feet): (hallway ambulation) Assistive device: Rolling walker (2 wheeled) Gait Pattern/deviations: Step-to pattern;Step-through pattern;Decreased stride length;Drifts right/left;Trunk flexed Gait velocity: decreased   General Gait Details: patient today with difficulties scanning to R visual field preferring a L gaze; Even with  L visual blocking from PT - noted L gaze preference. Poor ability to maintain midline with vision throughout session; difficulty navigating narrow spaces.   Stairs             Wheelchair Mobility    Modified Rankin (Stroke Patients Only)       Balance Overall balance assessment: Needs assistance Sitting-balance support: Feet supported;No upper extremity supported Sitting balance-Leahy Scale: Good     Standing balance support: Bilateral upper extremity supported;During functional activity Standing balance-Leahy Scale: Poor Standing balance comment: reliant on UE support                             Cognition Arousal/Alertness: Awake/alert Behavior During Therapy: Flat affect Overall Cognitive Status: Impaired/Different from baseline Area of Impairment: Following commands;Safety/judgement;Problem solving                       Following Commands: Follows one step commands inconsistently;Follows one step commands with increased time Safety/Judgement: Decreased awareness of safety;Decreased awareness of deficits   Problem Solving: Slow processing;Decreased initiation;Difficulty sequencing;Requires verbal cues;Requires tactile cues        Exercises      General Comments        Pertinent Vitals/Pain Pain Assessment: No/denies pain    Home Living                      Prior Function            PT Goals (current goals can now be found in the care plan section) Acute Rehab PT Goals Patient Stated Goal: none stated  PT Goal Formulation:  With patient/family Time For Goal Achievement: 07/29/18 Potential to Achieve Goals: Good Progress towards PT goals: Progressing toward goals    Frequency    Min 3X/week      PT Plan Current plan remains appropriate    Co-evaluation              AM-PAC PT "6 Clicks" Daily Activity  Outcome Measure  Difficulty turning over in bed (including adjusting bedclothes, sheets and  blankets)?: A Lot Difficulty moving from lying on back to sitting on the side of the bed? : Unable Difficulty sitting down on and standing up from a chair with arms (e.g., wheelchair, bedside commode, etc,.)?: Unable Help needed moving to and from a bed to chair (including a wheelchair)?: A Little Help needed walking in hospital room?: A Lot Help needed climbing 3-5 steps with a railing? : Total 6 Click Score: 10    End of Session Equipment Utilized During Treatment: Gait belt Activity Tolerance: Patient tolerated treatment well Patient left: in chair;with call bell/phone within reach;with chair alarm set Nurse Communication: Mobility status PT Visit Diagnosis: Unsteadiness on feet (R26.81);Other abnormalities of gait and mobility (R26.89);Muscle weakness (generalized) (M62.81);Pain     Time: 1020-1038 PT Time Calculation (min) (ACUTE ONLY): 18 min  Charges:  $Gait Training: 8-22 mins                     Lanney Gins, PT, DPT 07/19/18 1:50 PM Pager: [phone]

## 2018-07-19 NOTE — Clinical Social Work Placement (Signed)
Nurse to call report to [phone], Room [place]  NOTE  Date:  07/19/2018  Patient Details  Name: Clayton Johnson MRN: [id number] Date of Birth: [birth year]  Clinical Social Work is seeking post-discharge placement for this patient at the [place] level of care (*CSW will initial, date and re-position this form in  chart as items are completed):  Yes   Patient/family provided with [facility] Work Department's list of facilities offering this level of care within the geographic area requested by the patient (or if unable, by the patient's family).  Yes   Patient/family informed of their freedom to choose among providers that offer the needed level of care, that participate in Medicare, Medicaid or managed care program needed by the patient, have an available bed and are willing to accept the patient.  Yes   Patient/family informed of [facility]'s ownership interest in [facility] and [facility], as well as of the fact that they are under no obligation to receive care at these facilities.  PASRR submitted to EDS on 07/17/18     PASRR number received on 07/17/18     Existing PASRR number confirmed on       FL2 transmitted to all facilities in geographic area requested by pt/family on 07/17/18     FL2 transmitted to all facilities within larger geographic area on       Patient informed that his/her managed care company has contracts with or will negotiate with certain facilities, including the following:        Yes   Patient/family informed of bed offers received.  Patient chooses bed at [facility]     Physician recommends and patient chooses bed at      Patient to be transferred to [facility] on 07/19/18.  Patient to be transferred to facility by Family car     Patient family notified on 07/19/18 of transfer.  Name of family member notified:  Melissa     PHYSICIAN        Additional Comment:    _______________________________________________ Geralynn Ochs, LCSW 07/19/2018, 12:16 PM

## 2018-07-19 NOTE — Progress Notes (Signed)
Patient is to discharge to SNF with close follow up with neurology and cardiology. I have called cardiologist office at [phone] requested an early cardiology follow up appointment on 8/27 with Dr Essie Christine.

## 2018-07-19 NOTE — Progress Notes (Signed)
At 2216 pt's CBG was 65. Pt was administered 2106m apple juice with three added teaspoons of sugar. Upon subsequent check, pt's CBG had risen to 176. HS insulin was held, as order parameters were not met.

## 2018-07-19 NOTE — Care Management Note (Signed)
Case Management Note  Patient Details  Name: Adoni Greenough MRN: [id number] Date of Birth: 04-08-1934  Subjective/Objective:                    Action/Plan: Pt discharging to [facility]. CM signing off.   Expected Discharge Date:  07/19/18               Expected Discharge Plan:  Skilled Nursing Facility  In-House Referral:  Clinical Social Work  Discharge planning Services     Post Acute Care Choice:    Choice offered to:     DME Arranged:    DME Agency:     HH Arranged:    [facility] Agency:     Status of Service:  Completed, signed off  If discussed at [employer], dates discussed:    Additional Comments:  Pollie Friar, RN 07/19/2018, 12:26 PM

## 2018-07-19 NOTE — Plan of Care (Signed)
Adequate for discharge.

## 2018-07-24 DIAGNOSIS — I639 Cerebral infarction, unspecified: Secondary | ICD-10-CM | POA: Diagnosis not present

## 2018-07-25 ENCOUNTER — Encounter: Payer: Self-pay | Admitting: Cardiology

## 2018-07-25 ENCOUNTER — Ambulatory Visit (INDEPENDENT_AMBULATORY_CARE_PROVIDER_SITE_OTHER): Payer: Medicare Other | Admitting: Cardiology

## 2018-07-25 VITALS — BP 120/62 | HR 65 | Ht 68.0 in | Wt 200.0 lb

## 2018-07-25 DIAGNOSIS — I10 Essential (primary) hypertension: Secondary | ICD-10-CM | POA: Diagnosis not present

## 2018-07-25 DIAGNOSIS — E1169 Type 2 diabetes mellitus with other specified complication: Secondary | ICD-10-CM

## 2018-07-25 DIAGNOSIS — I6330 Cerebral infarction due to thrombosis of unspecified cerebral artery: Secondary | ICD-10-CM

## 2018-07-25 DIAGNOSIS — E669 Obesity, unspecified: Secondary | ICD-10-CM

## 2018-07-25 DIAGNOSIS — R002 Palpitations: Secondary | ICD-10-CM

## 2018-07-25 DIAGNOSIS — E782 Mixed hyperlipidemia: Secondary | ICD-10-CM | POA: Diagnosis not present

## 2018-07-25 NOTE — Patient Instructions (Signed)
Medication Instructions:  Your physician recommends that you continue on your current medications as directed. Please refer to the Current Medication list given to you today.  Labwork: None  Testing/Procedures: Your physician has recommended that you wear an event monitor. Event monitors are medical devices that record the heart's electrical activity. Doctors most often [place] these monitors to diagnose arrhythmias. Arrhythmias are problems with the speed or rhythm of the heartbeat. The monitor is a small, portable device. You can wear one while you do your normal daily activities. This is usually used to diagnose what is causing palpitations/syncope (passing out).  Follow-Up: Your physician recommends that you schedule a follow-up appointment in: 2 months with Dr. Agustin Cree  Any Other Special Instructions Will Be Listed Below (If Applicable).     If you need a refill on your cardiac medications before your next appointment, please call your pharmacy.   [place], RN, BSN

## 2018-07-25 NOTE — Progress Notes (Signed)
Cardiology Office Note:    Date:  07/25/2018   ID:  Clayton Johnson, DOB 11-28-1934, MRN [id number]  PCP:  Ronita Hipps, MD  Cardiologist:  Jenean Lindau, MD   Referring MD: Ronita Hipps, MD    ASSESSMENT:    1. Essential hypertension   2. Cerebral thrombosis with cerebral infarction   3. Diabetes mellitus type 2 in obese ([place])   4. Mixed hyperlipidemia    PLAN:    In order of problems listed above:  1. I discussed my findings with the patient's daughter at extensive length.  I reviewed records extensively.  Secondary prevention is suggested and the patient is on aspirin as suggested by neurology. 2. There was a question whether patient needs an event monitor for anticoagulation.  The notes mention that if atrial flutter is seen on the monitor then he should be considered for anticoagulation.  In my opinion in view of the patient's gait, poor vision and inability to participate in his care, I think is a very high risk for adverse effects from anticoagulation.  I discussed with his daughter about this and she is vehemently against it.  We will do a one-month event monitor to assess to see if she has any tachy arrhythmias that need treatment. 3. He will be seen in follow-up appointment in 2 months by Dr. Agustin Cree or earlier if he has any concerns   Medication Adjustments/Labs and Tests Ordered: Current medicines are reviewed at length with the patient today.  Concerns regarding medicines are outlined above.  No orders of the defined types were placed in this encounter.  No orders of the defined types were placed in this encounter.    History of Present Illness:    Clayton Johnson is a 84 y.o. male who is being seen today for the evaluation of recent stroke at the request of Ronita Hipps, MD.  The patient is accompanied by his daughter.  Patient is a poor historian and is brought in in a wheelchair.  The daughter mentions to me that he had a stroke recently.  I  looked up the medical records extensively.  The patient has issues with his gait and is very unstable on walking and also his vision has been affected significantly by his stroke.  At some point at [facility] there was a question of atrial flutter which was not detected at [facility].  Patient underwent evaluation and treatment and was referred here for follow-up.  At the time of my evaluation, the patient is alert awake oriented and in no distress.  He has seen Dr. Agustin Cree my partner in the past.  Past Medical History:  Diagnosis Date  . Anxiety disorder   . Bladder cancer ([place])   . Coronary artery disease   . Degenerative arthritis   . Diabetes ([place])   . Dyslipidemia   . GERD (gastroesophageal reflux disease)   . Hypertension   . Obesity   . TIA (transient ischemic attack)    MULTIPLE,BIHEMISPHERIC    Past Surgical History:  Procedure Laterality Date  . BLADDER REPAIR    . CORONARY STENT PLACEMENT    . NASAL SINUS SURGERY    . SHOULDER OPEN ROTATOR CUFF REPAIR Right   . TOTAL KNEE ARTHROPLASTY Right     Current Medications: Current Meds  Medication Sig  . acetaminophen (TYLENOL) 500 MG tablet Take 1,000 mg by mouth every 6 (six) hours as needed (for pain or headaches).  Marland Kitchen aspirin EC  81 MG tablet Take 81 mg by mouth daily.   . cetirizine (ZYRTEC) 10 MG tablet Take 10 mg by mouth daily.  Marland Kitchen donepezil (ARICEPT) 10 MG tablet Take 10 mg by mouth at bedtime.  . finasteride (PROSCAR) 5 MG tablet Take 5 mg by mouth at bedtime.  . fluticasone (FLONASE) 50 MCG/ACT nasal spray Place 1-2 sprays into both nostrils daily.  . insulin aspart protamine- aspart (NOVOLOG MIX 70/30) (70-30) 100 UNIT/ML injection Inject 0.25 mLs (25 Units total) into the skin 2 (two) times daily with a meal.  . metoprolol tartrate (LOPRESSOR) 25 MG tablet Take 1 tablet (25 mg total) by mouth 2 (two) times daily.  . pravastatin (PRAVACHOL) 10 MG tablet Take 10 mg by mouth daily.  . tamsulosin  (FLOMAX) 0.4 MG CAPS capsule Take 0.4 mg by mouth daily after supper.     Allergies:   Lactose intolerance (gi)   Social History   Socioeconomic History  . Marital status: Divorced    Spouse name: Not on file  . Number of children: 3  . Years of education: 12+  . Highest education level: Not on file  Occupational History  . Occupation: RETIRED  Social Needs  . Financial resource strain: Not on file  . Food insecurity:    Worry: Not on file    Inability: Not on file  . Transportation needs:    Medical: Not on file    Non-medical: Not on file  Tobacco Use  . Smoking status: Never Smoker  . Smokeless tobacco: Never Used  Substance and Sexual Activity  . Alcohol use: No  . Drug use: No  . Sexual activity: Not on file  Lifestyle  . Physical activity:    Days per week: Not on file    Minutes per session: Not on file  . Stress: Not on file  Relationships  . Social connections:    Talks on phone: Not on file    Gets together: Not on file    Attends religious service: Not on file    Active member of club or organization: Not on file    Attends meetings of clubs or organizations: Not on file    Relationship status: Not on file  Other Topics Concern  . Not on file  Social History Narrative   Lives at home alone   Right-handed   Caffeine: diet Mt Dew     Family History: The patient's family history includes Heart attack in his brother; Heart disease in his mother; Renal Disease in his sister; Stroke in his father.  ROS:   Please see the history of present illness.    All other systems reviewed and are negative.  EKGs/Labs/Other Studies Reviewed:    The following studies were reviewed today: I discussed my findings with the patient and the daughter at extensive length.   Recent Labs: 07/16/2018: Magnesium 2.1 07/18/2018: BUN 22; Creatinine, Ser 1.54; Hemoglobin 11.6; Platelets 238; Potassium 3.6; Sodium 138  Recent Lipid Panel    Component Value Date/Time   CHOL  129 07/15/2018 0456   TRIG 130 07/15/2018 0456   HDL 43 07/15/2018 0456   CHOLHDL 3.0 07/15/2018 0456   VLDL 26 07/15/2018 0456   LDLCALC 60 07/15/2018 0456    Physical Exam:    VS:  BP 120/62 (BP Location: Right Arm, Patient Position: Sitting, Cuff Size: Normal)   Pulse 65   Ht 5\' 8"  (1.727 m)   Wt 200 lb (90.7 kg)   SpO2 97%  BMI 30.41 kg/m     Wt Readings from Last 3 Encounters:  07/25/18 200 lb (90.7 kg)  07/14/18 212 lb 11.9 oz (96.5 kg)  05/16/17 202 lb 6.4 oz (91.8 kg)     GEN: Patient is in no acute distress HEENT: Normal NECK: No JVD; No carotid bruits LYMPHATICS: No lymphadenopathy CARDIAC: S1 S2 regular, 2/6 systolic murmur at the apex. RESPIRATORY:  Clear to auscultation without rales, wheezing or rhonchi  ABDOMEN: Soft, non-tender, non-distended MUSCULOSKELETAL:  No edema; No deformity  SKIN: Warm and dry NEUROLOGIC:  Alert and oriented x 3 PSYCHIATRIC:  Normal affect    Signed, Jenean Lindau, MD  07/25/2018 11:03 AM    Clayton Johnson

## 2018-08-07 ENCOUNTER — Ambulatory Visit (INDEPENDENT_AMBULATORY_CARE_PROVIDER_SITE_OTHER): Payer: Medicare Other

## 2018-08-07 DIAGNOSIS — I471 Supraventricular tachycardia: Secondary | ICD-10-CM | POA: Diagnosis not present

## 2018-08-07 NOTE — Progress Notes (Unsigned)
30 day event was switched to zio patch as it was not an appropriate option for the patient residing in nursing home. Son was agreeable to this change. Okay per Dr. Bettina Gavia.

## 2018-08-11 DIAGNOSIS — Z8551 Personal history of malignant neoplasm of bladder: Secondary | ICD-10-CM | POA: Diagnosis not present

## 2018-08-11 DIAGNOSIS — I4891 Unspecified atrial fibrillation: Secondary | ICD-10-CM | POA: Diagnosis not present

## 2018-08-11 DIAGNOSIS — Z7982 Long term (current) use of aspirin: Secondary | ICD-10-CM | POA: Diagnosis not present

## 2018-08-11 DIAGNOSIS — R1310 Dysphagia, unspecified: Secondary | ICD-10-CM | POA: Diagnosis not present

## 2018-08-11 DIAGNOSIS — I471 Supraventricular tachycardia: Secondary | ICD-10-CM | POA: Diagnosis not present

## 2018-08-11 DIAGNOSIS — E1151 Type 2 diabetes mellitus with diabetic peripheral angiopathy without gangrene: Secondary | ICD-10-CM | POA: Diagnosis not present

## 2018-08-11 DIAGNOSIS — I69391 Dysphagia following cerebral infarction: Secondary | ICD-10-CM | POA: Diagnosis not present

## 2018-08-11 DIAGNOSIS — Z794 Long term (current) use of insulin: Secondary | ICD-10-CM | POA: Diagnosis not present

## 2018-08-11 DIAGNOSIS — Z7902 Long term (current) use of antithrombotics/antiplatelets: Secondary | ICD-10-CM | POA: Diagnosis not present

## 2018-08-11 DIAGNOSIS — N183 Chronic kidney disease, stage 3 (moderate): Secondary | ICD-10-CM | POA: Diagnosis not present

## 2018-08-11 DIAGNOSIS — N390 Urinary tract infection, site not specified: Secondary | ICD-10-CM | POA: Diagnosis not present

## 2018-08-11 DIAGNOSIS — I2510 Atherosclerotic heart disease of native coronary artery without angina pectoris: Secondary | ICD-10-CM | POA: Diagnosis not present

## 2018-08-11 DIAGNOSIS — I129 Hypertensive chronic kidney disease with stage 1 through stage 4 chronic kidney disease, or unspecified chronic kidney disease: Secondary | ICD-10-CM | POA: Diagnosis not present

## 2018-08-11 DIAGNOSIS — Z9181 History of falling: Secondary | ICD-10-CM | POA: Diagnosis not present

## 2018-08-13 DIAGNOSIS — E1151 Type 2 diabetes mellitus with diabetic peripheral angiopathy without gangrene: Secondary | ICD-10-CM | POA: Diagnosis not present

## 2018-08-13 DIAGNOSIS — I2510 Atherosclerotic heart disease of native coronary artery without angina pectoris: Secondary | ICD-10-CM | POA: Diagnosis not present

## 2018-08-13 DIAGNOSIS — N390 Urinary tract infection, site not specified: Secondary | ICD-10-CM | POA: Diagnosis not present

## 2018-08-13 DIAGNOSIS — R1310 Dysphagia, unspecified: Secondary | ICD-10-CM | POA: Diagnosis not present

## 2018-08-13 DIAGNOSIS — Z7902 Long term (current) use of antithrombotics/antiplatelets: Secondary | ICD-10-CM | POA: Diagnosis not present

## 2018-08-13 DIAGNOSIS — Z794 Long term (current) use of insulin: Secondary | ICD-10-CM | POA: Diagnosis not present

## 2018-08-13 DIAGNOSIS — I471 Supraventricular tachycardia: Secondary | ICD-10-CM | POA: Diagnosis not present

## 2018-08-13 DIAGNOSIS — Z7982 Long term (current) use of aspirin: Secondary | ICD-10-CM | POA: Diagnosis not present

## 2018-08-13 DIAGNOSIS — Z8551 Personal history of malignant neoplasm of bladder: Secondary | ICD-10-CM | POA: Diagnosis not present

## 2018-08-13 DIAGNOSIS — N183 Chronic kidney disease, stage 3 (moderate): Secondary | ICD-10-CM | POA: Diagnosis not present

## 2018-08-13 DIAGNOSIS — I69391 Dysphagia following cerebral infarction: Secondary | ICD-10-CM | POA: Diagnosis not present

## 2018-08-13 DIAGNOSIS — Z9181 History of falling: Secondary | ICD-10-CM | POA: Diagnosis not present

## 2018-08-13 DIAGNOSIS — I4891 Unspecified atrial fibrillation: Secondary | ICD-10-CM | POA: Diagnosis not present

## 2018-08-13 DIAGNOSIS — I129 Hypertensive chronic kidney disease with stage 1 through stage 4 chronic kidney disease, or unspecified chronic kidney disease: Secondary | ICD-10-CM | POA: Diagnosis not present

## 2018-08-14 ENCOUNTER — Ambulatory Visit: Payer: Medicare Other | Admitting: Cardiology

## 2018-08-15 DIAGNOSIS — I10 Essential (primary) hypertension: Secondary | ICD-10-CM | POA: Diagnosis not present

## 2018-08-15 DIAGNOSIS — I471 Supraventricular tachycardia: Secondary | ICD-10-CM | POA: Diagnosis not present

## 2018-08-15 DIAGNOSIS — N390 Urinary tract infection, site not specified: Secondary | ICD-10-CM | POA: Diagnosis not present

## 2018-08-15 DIAGNOSIS — Z9181 History of falling: Secondary | ICD-10-CM | POA: Diagnosis not present

## 2018-08-15 DIAGNOSIS — R1310 Dysphagia, unspecified: Secondary | ICD-10-CM | POA: Diagnosis not present

## 2018-08-15 DIAGNOSIS — I69391 Dysphagia following cerebral infarction: Secondary | ICD-10-CM | POA: Diagnosis not present

## 2018-08-15 DIAGNOSIS — Z8551 Personal history of malignant neoplasm of bladder: Secondary | ICD-10-CM | POA: Diagnosis not present

## 2018-08-15 DIAGNOSIS — Z7982 Long term (current) use of aspirin: Secondary | ICD-10-CM | POA: Diagnosis not present

## 2018-08-15 DIAGNOSIS — I4891 Unspecified atrial fibrillation: Secondary | ICD-10-CM | POA: Diagnosis not present

## 2018-08-15 DIAGNOSIS — N183 Chronic kidney disease, stage 3 (moderate): Secondary | ICD-10-CM | POA: Diagnosis not present

## 2018-08-15 DIAGNOSIS — Z7902 Long term (current) use of antithrombotics/antiplatelets: Secondary | ICD-10-CM | POA: Diagnosis not present

## 2018-08-15 DIAGNOSIS — I2510 Atherosclerotic heart disease of native coronary artery without angina pectoris: Secondary | ICD-10-CM | POA: Diagnosis not present

## 2018-08-15 DIAGNOSIS — Z23 Encounter for immunization: Secondary | ICD-10-CM | POA: Diagnosis not present

## 2018-08-15 DIAGNOSIS — E1151 Type 2 diabetes mellitus with diabetic peripheral angiopathy without gangrene: Secondary | ICD-10-CM | POA: Diagnosis not present

## 2018-08-15 DIAGNOSIS — I129 Hypertensive chronic kidney disease with stage 1 through stage 4 chronic kidney disease, or unspecified chronic kidney disease: Secondary | ICD-10-CM | POA: Diagnosis not present

## 2018-08-15 DIAGNOSIS — Z794 Long term (current) use of insulin: Secondary | ICD-10-CM | POA: Diagnosis not present

## 2018-08-15 DIAGNOSIS — E1165 Type 2 diabetes mellitus with hyperglycemia: Secondary | ICD-10-CM | POA: Diagnosis not present

## 2018-08-16 DIAGNOSIS — E1151 Type 2 diabetes mellitus with diabetic peripheral angiopathy without gangrene: Secondary | ICD-10-CM | POA: Diagnosis not present

## 2018-08-16 DIAGNOSIS — Z7902 Long term (current) use of antithrombotics/antiplatelets: Secondary | ICD-10-CM | POA: Diagnosis not present

## 2018-08-16 DIAGNOSIS — I2510 Atherosclerotic heart disease of native coronary artery without angina pectoris: Secondary | ICD-10-CM | POA: Diagnosis not present

## 2018-08-16 DIAGNOSIS — Z7982 Long term (current) use of aspirin: Secondary | ICD-10-CM | POA: Diagnosis not present

## 2018-08-16 DIAGNOSIS — I4891 Unspecified atrial fibrillation: Secondary | ICD-10-CM | POA: Diagnosis not present

## 2018-08-16 DIAGNOSIS — Z794 Long term (current) use of insulin: Secondary | ICD-10-CM | POA: Diagnosis not present

## 2018-08-16 DIAGNOSIS — R1310 Dysphagia, unspecified: Secondary | ICD-10-CM | POA: Diagnosis not present

## 2018-08-16 DIAGNOSIS — I471 Supraventricular tachycardia: Secondary | ICD-10-CM | POA: Diagnosis not present

## 2018-08-16 DIAGNOSIS — Z9181 History of falling: Secondary | ICD-10-CM | POA: Diagnosis not present

## 2018-08-16 DIAGNOSIS — N183 Chronic kidney disease, stage 3 (moderate): Secondary | ICD-10-CM | POA: Diagnosis not present

## 2018-08-16 DIAGNOSIS — I129 Hypertensive chronic kidney disease with stage 1 through stage 4 chronic kidney disease, or unspecified chronic kidney disease: Secondary | ICD-10-CM | POA: Diagnosis not present

## 2018-08-16 DIAGNOSIS — N390 Urinary tract infection, site not specified: Secondary | ICD-10-CM | POA: Diagnosis not present

## 2018-08-16 DIAGNOSIS — I69391 Dysphagia following cerebral infarction: Secondary | ICD-10-CM | POA: Diagnosis not present

## 2018-08-16 DIAGNOSIS — Z8551 Personal history of malignant neoplasm of bladder: Secondary | ICD-10-CM | POA: Diagnosis not present

## 2018-08-18 DIAGNOSIS — Z7902 Long term (current) use of antithrombotics/antiplatelets: Secondary | ICD-10-CM | POA: Diagnosis not present

## 2018-08-18 DIAGNOSIS — N390 Urinary tract infection, site not specified: Secondary | ICD-10-CM | POA: Diagnosis not present

## 2018-08-18 DIAGNOSIS — I471 Supraventricular tachycardia: Secondary | ICD-10-CM | POA: Diagnosis not present

## 2018-08-18 DIAGNOSIS — E1151 Type 2 diabetes mellitus with diabetic peripheral angiopathy without gangrene: Secondary | ICD-10-CM | POA: Diagnosis not present

## 2018-08-18 DIAGNOSIS — I2510 Atherosclerotic heart disease of native coronary artery without angina pectoris: Secondary | ICD-10-CM | POA: Diagnosis not present

## 2018-08-18 DIAGNOSIS — N183 Chronic kidney disease, stage 3 (moderate): Secondary | ICD-10-CM | POA: Diagnosis not present

## 2018-08-18 DIAGNOSIS — Z7982 Long term (current) use of aspirin: Secondary | ICD-10-CM | POA: Diagnosis not present

## 2018-08-18 DIAGNOSIS — I129 Hypertensive chronic kidney disease with stage 1 through stage 4 chronic kidney disease, or unspecified chronic kidney disease: Secondary | ICD-10-CM | POA: Diagnosis not present

## 2018-08-18 DIAGNOSIS — Z794 Long term (current) use of insulin: Secondary | ICD-10-CM | POA: Diagnosis not present

## 2018-08-18 DIAGNOSIS — I4891 Unspecified atrial fibrillation: Secondary | ICD-10-CM | POA: Diagnosis not present

## 2018-08-18 DIAGNOSIS — I69391 Dysphagia following cerebral infarction: Secondary | ICD-10-CM | POA: Diagnosis not present

## 2018-08-18 DIAGNOSIS — R1310 Dysphagia, unspecified: Secondary | ICD-10-CM | POA: Diagnosis not present

## 2018-08-18 DIAGNOSIS — Z9181 History of falling: Secondary | ICD-10-CM | POA: Diagnosis not present

## 2018-08-18 DIAGNOSIS — Z8551 Personal history of malignant neoplasm of bladder: Secondary | ICD-10-CM | POA: Diagnosis not present

## 2018-08-21 DIAGNOSIS — I471 Supraventricular tachycardia: Secondary | ICD-10-CM | POA: Diagnosis not present

## 2018-08-21 DIAGNOSIS — I129 Hypertensive chronic kidney disease with stage 1 through stage 4 chronic kidney disease, or unspecified chronic kidney disease: Secondary | ICD-10-CM | POA: Diagnosis not present

## 2018-08-21 DIAGNOSIS — Z9181 History of falling: Secondary | ICD-10-CM | POA: Diagnosis not present

## 2018-08-21 DIAGNOSIS — Z794 Long term (current) use of insulin: Secondary | ICD-10-CM | POA: Diagnosis not present

## 2018-08-21 DIAGNOSIS — Z8551 Personal history of malignant neoplasm of bladder: Secondary | ICD-10-CM | POA: Diagnosis not present

## 2018-08-21 DIAGNOSIS — I4891 Unspecified atrial fibrillation: Secondary | ICD-10-CM | POA: Diagnosis not present

## 2018-08-21 DIAGNOSIS — Z7902 Long term (current) use of antithrombotics/antiplatelets: Secondary | ICD-10-CM | POA: Diagnosis not present

## 2018-08-21 DIAGNOSIS — N390 Urinary tract infection, site not specified: Secondary | ICD-10-CM | POA: Diagnosis not present

## 2018-08-21 DIAGNOSIS — N183 Chronic kidney disease, stage 3 (moderate): Secondary | ICD-10-CM | POA: Diagnosis not present

## 2018-08-21 DIAGNOSIS — R1310 Dysphagia, unspecified: Secondary | ICD-10-CM | POA: Diagnosis not present

## 2018-08-21 DIAGNOSIS — Z7982 Long term (current) use of aspirin: Secondary | ICD-10-CM | POA: Diagnosis not present

## 2018-08-21 DIAGNOSIS — I2510 Atherosclerotic heart disease of native coronary artery without angina pectoris: Secondary | ICD-10-CM | POA: Diagnosis not present

## 2018-08-21 DIAGNOSIS — E1151 Type 2 diabetes mellitus with diabetic peripheral angiopathy without gangrene: Secondary | ICD-10-CM | POA: Diagnosis not present

## 2018-08-21 DIAGNOSIS — I69391 Dysphagia following cerebral infarction: Secondary | ICD-10-CM | POA: Diagnosis not present

## 2018-08-22 ENCOUNTER — Encounter: Payer: Self-pay | Admitting: Neurology

## 2018-08-22 ENCOUNTER — Ambulatory Visit: Payer: Medicare Other | Admitting: Neurology

## 2018-08-22 ENCOUNTER — Encounter

## 2018-08-22 VITALS — BP 125/60 | HR 68 | Ht 68.0 in | Wt 201.0 lb

## 2018-08-22 DIAGNOSIS — I480 Paroxysmal atrial fibrillation: Secondary | ICD-10-CM | POA: Diagnosis not present

## 2018-08-22 MED ORDER — APIXABAN 2.5 MG PO TABS: 2.5000 mg | ORAL_TABLET | Freq: Two times a day (BID) | ORAL | Status: AC

## 2018-08-22 NOTE — Patient Instructions (Addendum)
I had a long discussion with the patient and his daughter regarding his recent embolic right PCA stroke and atrial flutter fibrillation.  The patient does have moderate dementia and may be a fall risk due to his visual deficit but this is a second embolic stroke within year and a half and I feel he will benefit with long-term anticoagulation now that he has 24-hour caregiver support at home and his medications are dispensed in a supervised fashion.  I discussed risk benefit of anticoagulation and bleeding risk with the patient and daughter and they are in agreement.  Recommend discontinue aspirin and change to Eliquis 2.5 mg twice daily.  Maintain aggressive risk factor modification with strict control of hypertension with blood pressure goal below 130/90 and lipids with LDL cholesterol goal below 7 0 mg percent.  Continue Aricept for his dementia.  He will need 24-hour caregiver support for life.  He was advised to use his walker at all times for ambulation.  He will return for follow-up in the future and 2 months with my nurse practitioner Janett Billow or call earlier if necessary  Apixaban oral tablets What is this medicine? APIXABAN (a PIX a ban) is an anticoagulant (blood thinner). It is used to lower the chance of stroke in people with a medical condition called atrial fibrillation. It is also used to treat or prevent blood clots in the lungs or in the veins. This medicine may be used for other purposes; ask your health care provider or pharmacist if you have questions. COMMON BRAND NAME(S): Eliquis What should I tell my health care provider before I take this medicine? They need to know if you have any of these conditions: -bleeding disorders -bleeding in the brain -blood in your stools (black or tarry stools) or if you have blood in your vomit -history of stomach bleeding -kidney disease -liver disease -mechanical heart valve -an unusual or allergic reaction to apixaban, other medicines, foods,  dyes, or preservatives -pregnant or trying to get pregnant -breast-feeding How should I use this medicine? Take this medicine by mouth with a glass of water. Follow the directions on the prescription label. You can take it with or without food. If it upsets your stomach, take it with food. Take your medicine at regular intervals. Do not take it more often than directed. Do not stop taking except on your doctor's advice. Stopping this medicine may increase your risk of a blot clot. Be sure to refill your prescription before you run out of medicine. Talk to your pediatrician regarding the use of this medicine in children. Special care may be needed. Overdosage: If you think you have taken too much of this medicine contact a poison control center or emergency room at once. NOTE: This medicine is only for you. Do not share this medicine with others. What if I miss a dose? If you miss a dose, take it as soon as you can. If it is almost time for your next dose, take only that dose. Do not take double or extra doses. What may interact with this medicine? This medicine may interact with the following: -aspirin and aspirin-like medicines -certain medicines for fungal infections like ketoconazole and itraconazole -certain medicines for seizures like carbamazepine and phenytoin -certain medicines that treat or prevent blood clots like warfarin, enoxaparin, and dalteparin -clarithromycin -NSAIDs, medicines for pain and inflammation, like ibuprofen or naproxen -rifampin -ritonavir -St. John's wort This list may not describe all possible interactions. Give your health care provider a list of all  the medicines, herbs, non-prescription drugs, or dietary supplements you use. Also tell them if you smoke, drink alcohol, or use illegal drugs. Some items may interact with your medicine. What should I watch for while using this medicine? Visit your doctor or health care professional for regular checks on your  progress. Notify your doctor or health care professional and seek emergency treatment if you develop breathing problems; changes in vision; chest pain; severe, sudden headache; pain, swelling, warmth in the leg; trouble speaking; sudden numbness or weakness of the face, arm or leg. These can be signs that your condition has gotten worse. If you are going to have surgery or other procedure, tell your doctor that you are taking this medicine. What side effects may I notice from receiving this medicine? Side effects that you should report to your doctor or health care professional as soon as possible: -allergic reactions like skin rash, itching or hives, swelling of the face, lips, or tongue -signs and symptoms of bleeding such as bloody or black, tarry stools; red or dark-brown urine; spitting up blood or brown material that looks like coffee grounds; red spots on the skin; unusual bruising or bleeding from the eye, gums, or nose This list may not describe all possible side effects. Call your doctor for medical advice about side effects. You may report side effects to FDA at 1-800-FDA-1088. Where should I keep my medicine? Keep out of the reach of children. Store at room temperature between 20 and 25 degrees C (68 and 77 degrees F). Throw away any unused medicine after the expiration date. NOTE: This sheet is a summary. It may not cover all possible information. If you have questions about this medicine, talk to your doctor, pharmacist, or health care provider.  2018 Elsevier/Gold Standard (2016-06-07 11:54:23)

## 2018-08-22 NOTE — Progress Notes (Signed)
[facility] [address] [place]. [place] [address] [phone]       OFFICE CONSULT NOTE  Clayton. Clayton Johnson Date of Birth:  13-Jun-1934 Medical Record Number:  [id number]   Referring MD: Clayton Johnson   Reason for Referral: stroke HPI: Clayton Johnson is a 84 year old pleasant Caucasian male is seen today for initial consultation visit.  He is accompanied by his daughter who provides most of the history.  I have also reviewed electronic medical records and personally reviewed imaging films.Clayton Johnson is an 84 y.o. male past medical history dementia, diabetes, hypertension, bladder cancer, TIA, obesity who transferred from [facility] for management of atrial flutter and family request for an opinion with a neurologist for management of right PCA stroke. He was known normal was on 07/09/18 Patient developed headache and blurred vision.  Is apparently sent back from the ER.  Presented again on 812 with altered mental status as well as fever.  Work-up revealed a right PCA stroke, however there was hemorrhagic transformation.  He was evaluated by tele-neurology who recommended anticoagulation after 2 weeks as he was noted to be in atrial fibrillation/ A-flutter on telemetry(.  This is confirmed and patient was planned to have outpatient ZIO monitor-Per note.)  Family was not happy that he did not have a face-to-face encounter with the neurologist and requested transfer to [facility]. Date last known well: 8.11.19 tPA Given: no, outside window  NIHSS: 2 Baseline MRS 0 CT scan of the head showed subacute hemorrhagic right occipital lobe infarct with mild localized edema without significant regional mass-effect.  MRI scan of the brain had previously be done at [facility] confirming right occipital infarct.  LDL cholesterol was 60 mg percent and hemoglobin A1c was 11.3.  Patient was started on aspirin and anticoagulation was recommended after 2 weeks due to  hemorrhagic transformation in the infarct.  Patient was transferred to rehab and is currently living at home.  He has 24-hour care at home.  He is living with his son.  Patient is able to walk with a walker though he still has left-sided persistent vision loss.  He has a history of dementia over the last 5 years and the family feels that it may have gotten a little worse now.  He has had only one fall during his stay at left nursing home and has not had no falls at home yet.  He is careful and uses his walker.  He is currently getting home physical and occupational therapy.  The caregiver states that patient had a few episodes of shaking of his legs with transient unresponsiveness and facial twitching's but these lasted only few minutes while he was in the nursing home.  It has not happened since his come home.  He has no documented history of definite seizures.  Patient is still on aspirin.  He was seen by his cardiologist Dr. Geraldo Johnson who felt that the patient was high risk for falls and was not a good long-term anticoagulation candidate.  I had an extensive discussion with the patient and his daughter today about risk benefits of anticoagulation and since the patient is currently having 24-hour care at home and his medications are being given to him in a supervised fashion I believe the risk benefits may be in favor of continuing anticoagulation since the patient has had 2 strokes within the last year and a half.  Daughter is in agreement with the plan.  In May 2018 he was found  to have a left cerebellar infarct but at that time atrial fibrillation was not found.  He has been on Aricept for a long time.  It is unclear if Namenda has been tried on not ROS:   14 system review of systems is positive for incontinence, loss of vision, easy bruising, memory loss, confusion, headache, weakness, dizziness, disinterest in activities and all other systems negative PMH:  Past Medical History:  Diagnosis Date  .  Anxiety disorder   . Bladder cancer ([place])   . Coronary artery disease   . Degenerative arthritis   . Diabetes ([place])   . Dyslipidemia   . GERD (gastroesophageal reflux disease)   . Hypertension   . Obesity   . Stroke ([place])   . TIA (transient ischemic attack)    MULTIPLE,BIHEMISPHERIC    Social History:  Social History   Socioeconomic History  . Marital status: Divorced    Spouse name: Not on file  . Number of children: 3  . Years of education: 12+  . Highest education level: Not on file  Occupational History  . Occupation: RETIRED  Social Needs  . Financial resource strain: Not on file  . Food insecurity:    Worry: Not on file    Inability: Not on file  . Transportation needs:    Medical: Not on file    Non-medical: Not on file  Tobacco Use  . Smoking status: Never Smoker  . Smokeless tobacco: Never Used  Substance and Sexual Activity  . Alcohol use: No  . Drug use: No  . Sexual activity: Not on file  Lifestyle  . Physical activity:    Days per week: Not on file    Minutes per session: Not on file  . Stress: Not on file  Relationships  . Social connections:    Talks on phone: Not on file    Gets together: Not on file    Attends religious service: Not on file    Active member of club or organization: Not on file    Attends meetings of clubs or organizations: Not on file    Relationship status: Not on file  . Intimate partner violence:    Fear of current or ex partner: Not on file    Emotionally abused: Not on file    Physically abused: Not on file    Forced sexual activity: Not on file  Other Topics Concern  . Not on file  Social History Narrative   Lives at home alone   Right-handed   Caffeine: diet Mt Dew    Medications:   Current Outpatient Medications on File Prior to Visit  Medication Sig Dispense Refill  . acetaminophen (TYLENOL) 500 MG tablet Take 1,000 mg by mouth every 6 (six) hours as needed (for pain or headaches).    . Calcium  Carbonate-Vitamin D (OSCAL 500/200 D-3 PO) Take by mouth.    . cetirizine (ZYRTEC) 10 MG tablet Take 10 mg by mouth daily.    Marland Kitchen donepezil (ARICEPT) 10 MG tablet Take 10 mg by mouth at bedtime.    . finasteride (PROSCAR) 5 MG tablet Take 5 mg by mouth at bedtime.    . fluticasone (FLONASE) 50 MCG/ACT nasal spray Place 1-2 sprays into both nostrils daily.    . insulin glargine (LANTUS) 100 UNIT/ML injection Inject 25 Units into the skin daily.    . insulin regular (NOVOLIN R,HUMULIN R) 100 units/mL injection Inject into the skin 3 (three) times daily before meals.    Marland Kitchen  pravastatin (PRAVACHOL) 10 MG tablet Take 10 mg by mouth daily.    . tamsulosin (FLOMAX) 0.4 MG CAPS capsule Take 0.4 mg by mouth daily after supper.    . metoprolol tartrate (LOPRESSOR) 25 MG tablet Take 1 tablet (25 mg total) by mouth 2 (two) times daily. 60 tablet 0   No current facility-administered medications on file prior to visit.     Allergies:   Allergies  Allergen Reactions  . Lactose Intolerance (Gi) Other (See Comments)    Any dairy products make the patient's nasal passages VERY CONGESTED (is allergic)    Physical Exam General: well developed, well nourished elderly Caucasian male, seated, in no evident distress Head: head normocephalic and atraumatic.   Neck: supple with no carotid or supraclavicular bruits Cardiovascular: regular rate and rhythm, no murmurs Musculoskeletal: no deformity Skin:  no rash/petichiae Vascular:  Normal pulses all extremities  Neurologic Exam Mental Status: Awake and fully alert. Oriented to place and time. Recent and remote memory poor. Attention span, concentration and fund of knowledge diminished mood and affect appropriate.  Recall 0/3.  Follows simple 1 step and occasional two-step commands.  Able to name only 7 animals with [employer].  Clock drawing 0/4.  Unable to copy intersecting pentagons  Cranial Nerves: Fundoscopic exam reveals sharp disc margins. Pupils equal, briskly  reactive to light. Extraocular movements full without nystagmus. Visual fields showed dense left homonymous hemianopsia to confrontation. Hearing intact. Facial sensation intact. Face, tongue, palate moves normally and symmetrically.  Motor: Normal bulk and tone. Normal strength in all tested extremity muscles. Sensory.: intact to touch , pinprick , position and vibratory sensation.  Coordination: Rapid alternating movements normal in all extremities. Finger-to-nose and heel-to-shin performed accurately bilaterally. Gait and Station: Arises from chair with some difficulty. Stance is stooped. Gait demonstrates wide-based and slight imbalance. Not able to heel, toe and tandem walk    Reflexes: 1+ and symmetric. Toes downgoing.   NIHSS 5 Modified Rankin  3  ASSESSMENT: 84 year old Caucasian male with right posterior cerebral artery embolic infarct in August 2019 from atrial fibrillation/flutter.  He has residual left-sided homonymous hemianopsia and baseline  moderate dementia but does not appear to be significant fall risk and now has 24-hour care at home including supervised medications.     PLAN: I had a long discussion with the patient and his daughter regarding his recent embolic right PCA stroke and atrial flutter fibrillation.  The patient does have moderate dementia and may be a fall risk due to his visual deficit but this is a second embolic stroke within year and a half and I feel he will benefit with long-term anticoagulation now that he has 24-hour caregiver support at home and his medications are dispensed in a supervised fashion.  I discussed risk benefit of anticoagulation and bleeding risk with the patient and daughter and they are in agreement.  Recommend discontinue aspirin and change to Eliquis 2.5 mg twice daily.  Maintain aggressive risk factor modification with strict control of hypertension with blood pressure goal below 130/90 and lipids with LDL cholesterol goal below 7 0 mg  percent.  Continue Aricept for his dementia.  He will need 24-hour caregiver support for life.  He was advised to use his walker at all times for ambulation.  He will return for follow-up in the future and 2 months with my nurse practitioner Janett Billow or call earlier if necessary Antony Contras, MD  [facility] [address] [place], [place] [address]  Phone [phone]  Fax [phone] Note: This document was prepared with digital dictation and possible smart phrase technology. Any transcriptional errors that result from this process are unintentional.

## 2018-08-23 DIAGNOSIS — I4891 Unspecified atrial fibrillation: Secondary | ICD-10-CM | POA: Diagnosis not present

## 2018-08-23 DIAGNOSIS — I471 Supraventricular tachycardia: Secondary | ICD-10-CM | POA: Diagnosis not present

## 2018-08-23 DIAGNOSIS — E1151 Type 2 diabetes mellitus with diabetic peripheral angiopathy without gangrene: Secondary | ICD-10-CM | POA: Diagnosis not present

## 2018-08-23 DIAGNOSIS — R1310 Dysphagia, unspecified: Secondary | ICD-10-CM | POA: Diagnosis not present

## 2018-08-23 DIAGNOSIS — N183 Chronic kidney disease, stage 3 (moderate): Secondary | ICD-10-CM | POA: Diagnosis not present

## 2018-08-23 DIAGNOSIS — N390 Urinary tract infection, site not specified: Secondary | ICD-10-CM | POA: Diagnosis not present

## 2018-08-23 DIAGNOSIS — Z794 Long term (current) use of insulin: Secondary | ICD-10-CM | POA: Diagnosis not present

## 2018-08-23 DIAGNOSIS — Z8551 Personal history of malignant neoplasm of bladder: Secondary | ICD-10-CM | POA: Diagnosis not present

## 2018-08-23 DIAGNOSIS — I129 Hypertensive chronic kidney disease with stage 1 through stage 4 chronic kidney disease, or unspecified chronic kidney disease: Secondary | ICD-10-CM | POA: Diagnosis not present

## 2018-08-23 DIAGNOSIS — I69391 Dysphagia following cerebral infarction: Secondary | ICD-10-CM | POA: Diagnosis not present

## 2018-08-23 DIAGNOSIS — Z7902 Long term (current) use of antithrombotics/antiplatelets: Secondary | ICD-10-CM | POA: Diagnosis not present

## 2018-08-23 DIAGNOSIS — Z9181 History of falling: Secondary | ICD-10-CM | POA: Diagnosis not present

## 2018-08-23 DIAGNOSIS — Z7982 Long term (current) use of aspirin: Secondary | ICD-10-CM | POA: Diagnosis not present

## 2018-08-23 DIAGNOSIS — I2510 Atherosclerotic heart disease of native coronary artery without angina pectoris: Secondary | ICD-10-CM | POA: Diagnosis not present

## 2018-08-24 DIAGNOSIS — I129 Hypertensive chronic kidney disease with stage 1 through stage 4 chronic kidney disease, or unspecified chronic kidney disease: Secondary | ICD-10-CM | POA: Diagnosis not present

## 2018-08-24 DIAGNOSIS — Z7982 Long term (current) use of aspirin: Secondary | ICD-10-CM | POA: Diagnosis not present

## 2018-08-24 DIAGNOSIS — N183 Chronic kidney disease, stage 3 (moderate): Secondary | ICD-10-CM | POA: Diagnosis not present

## 2018-08-24 DIAGNOSIS — I471 Supraventricular tachycardia: Secondary | ICD-10-CM | POA: Diagnosis not present

## 2018-08-24 DIAGNOSIS — I69391 Dysphagia following cerebral infarction: Secondary | ICD-10-CM | POA: Diagnosis not present

## 2018-08-24 DIAGNOSIS — Z7902 Long term (current) use of antithrombotics/antiplatelets: Secondary | ICD-10-CM | POA: Diagnosis not present

## 2018-08-24 DIAGNOSIS — I2510 Atherosclerotic heart disease of native coronary artery without angina pectoris: Secondary | ICD-10-CM | POA: Diagnosis not present

## 2018-08-24 DIAGNOSIS — Z794 Long term (current) use of insulin: Secondary | ICD-10-CM | POA: Diagnosis not present

## 2018-08-24 DIAGNOSIS — I4891 Unspecified atrial fibrillation: Secondary | ICD-10-CM | POA: Diagnosis not present

## 2018-08-24 DIAGNOSIS — N390 Urinary tract infection, site not specified: Secondary | ICD-10-CM | POA: Diagnosis not present

## 2018-08-24 DIAGNOSIS — E1151 Type 2 diabetes mellitus with diabetic peripheral angiopathy without gangrene: Secondary | ICD-10-CM | POA: Diagnosis not present

## 2018-08-24 DIAGNOSIS — R1310 Dysphagia, unspecified: Secondary | ICD-10-CM | POA: Diagnosis not present

## 2018-08-24 DIAGNOSIS — Z8551 Personal history of malignant neoplasm of bladder: Secondary | ICD-10-CM | POA: Diagnosis not present

## 2018-08-24 DIAGNOSIS — Z9181 History of falling: Secondary | ICD-10-CM | POA: Diagnosis not present

## 2018-08-25 DIAGNOSIS — R1310 Dysphagia, unspecified: Secondary | ICD-10-CM | POA: Diagnosis not present

## 2018-08-25 DIAGNOSIS — Z794 Long term (current) use of insulin: Secondary | ICD-10-CM | POA: Diagnosis not present

## 2018-08-25 DIAGNOSIS — N390 Urinary tract infection, site not specified: Secondary | ICD-10-CM | POA: Diagnosis not present

## 2018-08-25 DIAGNOSIS — E1151 Type 2 diabetes mellitus with diabetic peripheral angiopathy without gangrene: Secondary | ICD-10-CM | POA: Diagnosis not present

## 2018-08-25 DIAGNOSIS — Z9181 History of falling: Secondary | ICD-10-CM | POA: Diagnosis not present

## 2018-08-25 DIAGNOSIS — I4891 Unspecified atrial fibrillation: Secondary | ICD-10-CM | POA: Diagnosis not present

## 2018-08-25 DIAGNOSIS — N183 Chronic kidney disease, stage 3 (moderate): Secondary | ICD-10-CM | POA: Diagnosis not present

## 2018-08-25 DIAGNOSIS — I2510 Atherosclerotic heart disease of native coronary artery without angina pectoris: Secondary | ICD-10-CM | POA: Diagnosis not present

## 2018-08-25 DIAGNOSIS — I471 Supraventricular tachycardia: Secondary | ICD-10-CM | POA: Diagnosis not present

## 2018-08-25 DIAGNOSIS — Z8551 Personal history of malignant neoplasm of bladder: Secondary | ICD-10-CM | POA: Diagnosis not present

## 2018-08-25 DIAGNOSIS — Z7902 Long term (current) use of antithrombotics/antiplatelets: Secondary | ICD-10-CM | POA: Diagnosis not present

## 2018-08-25 DIAGNOSIS — I129 Hypertensive chronic kidney disease with stage 1 through stage 4 chronic kidney disease, or unspecified chronic kidney disease: Secondary | ICD-10-CM | POA: Diagnosis not present

## 2018-08-25 DIAGNOSIS — Z7982 Long term (current) use of aspirin: Secondary | ICD-10-CM | POA: Diagnosis not present

## 2018-08-25 DIAGNOSIS — I69391 Dysphagia following cerebral infarction: Secondary | ICD-10-CM | POA: Diagnosis not present

## 2018-08-28 ENCOUNTER — Other Ambulatory Visit: Payer: Self-pay

## 2018-08-28 ENCOUNTER — Telehealth: Payer: Self-pay | Admitting: Neurology

## 2018-08-28 DIAGNOSIS — Z7902 Long term (current) use of antithrombotics/antiplatelets: Secondary | ICD-10-CM | POA: Diagnosis not present

## 2018-08-28 DIAGNOSIS — Z9181 History of falling: Secondary | ICD-10-CM | POA: Diagnosis not present

## 2018-08-28 DIAGNOSIS — I2510 Atherosclerotic heart disease of native coronary artery without angina pectoris: Secondary | ICD-10-CM | POA: Diagnosis not present

## 2018-08-28 DIAGNOSIS — N183 Chronic kidney disease, stage 3 (moderate): Secondary | ICD-10-CM | POA: Diagnosis not present

## 2018-08-28 DIAGNOSIS — I129 Hypertensive chronic kidney disease with stage 1 through stage 4 chronic kidney disease, or unspecified chronic kidney disease: Secondary | ICD-10-CM | POA: Diagnosis not present

## 2018-08-28 DIAGNOSIS — I69391 Dysphagia following cerebral infarction: Secondary | ICD-10-CM | POA: Diagnosis not present

## 2018-08-28 DIAGNOSIS — I471 Supraventricular tachycardia: Secondary | ICD-10-CM | POA: Diagnosis not present

## 2018-08-28 DIAGNOSIS — Z794 Long term (current) use of insulin: Secondary | ICD-10-CM | POA: Diagnosis not present

## 2018-08-28 DIAGNOSIS — Z8551 Personal history of malignant neoplasm of bladder: Secondary | ICD-10-CM | POA: Diagnosis not present

## 2018-08-28 DIAGNOSIS — Z7982 Long term (current) use of aspirin: Secondary | ICD-10-CM | POA: Diagnosis not present

## 2018-08-28 DIAGNOSIS — I4891 Unspecified atrial fibrillation: Secondary | ICD-10-CM | POA: Diagnosis not present

## 2018-08-28 DIAGNOSIS — N390 Urinary tract infection, site not specified: Secondary | ICD-10-CM | POA: Diagnosis not present

## 2018-08-28 DIAGNOSIS — E1151 Type 2 diabetes mellitus with diabetic peripheral angiopathy without gangrene: Secondary | ICD-10-CM | POA: Diagnosis not present

## 2018-08-28 DIAGNOSIS — R1310 Dysphagia, unspecified: Secondary | ICD-10-CM | POA: Diagnosis not present

## 2018-08-28 MED ORDER — APIXABAN 2.5 MG PO TABS: 2.5000 mg | ORAL_TABLET | Freq: Two times a day (BID) | ORAL | 1 refills | Status: AC

## 2018-08-28 NOTE — Telephone Encounter (Signed)
RN call patients daughter Clayton Johnson that eliquis 2.5mg  was sent to pharmacy listed. Melissa call the pharmacy on [address], and they do have the medication. Rn stated per DR Leonie Man, the cardiologist will have to  manage pts eliquis 2.5 for his atrial fibrillation. RN stated DR.Sehi gave refills till he can see his cardiologist. The daughter verbalized understanding, and knows pts cardiologist will manage future refills on the medication.

## 2018-08-28 NOTE — Telephone Encounter (Signed)
Refill sent again of eliquis to listed pharmacy for 3 month supply with one refill.

## 2018-08-28 NOTE — Telephone Encounter (Signed)
Pt daughter(on DPR-Mullany,Melissa) has called asking for what the status is on pt getting his prescription of apixaban (ELIQUIS) tablet 2.5 mg.  Daughter aware that Dr Leonie Man is not in the office this week but once RN has a response from Dr Leonie Man she will contact pt's daughter with a response

## 2018-08-29 DIAGNOSIS — I2510 Atherosclerotic heart disease of native coronary artery without angina pectoris: Secondary | ICD-10-CM | POA: Diagnosis not present

## 2018-08-29 DIAGNOSIS — E1151 Type 2 diabetes mellitus with diabetic peripheral angiopathy without gangrene: Secondary | ICD-10-CM | POA: Diagnosis not present

## 2018-08-29 DIAGNOSIS — I4891 Unspecified atrial fibrillation: Secondary | ICD-10-CM | POA: Diagnosis not present

## 2018-08-29 DIAGNOSIS — Z7982 Long term (current) use of aspirin: Secondary | ICD-10-CM | POA: Diagnosis not present

## 2018-08-29 DIAGNOSIS — I69391 Dysphagia following cerebral infarction: Secondary | ICD-10-CM | POA: Diagnosis not present

## 2018-08-29 DIAGNOSIS — N183 Chronic kidney disease, stage 3 (moderate): Secondary | ICD-10-CM | POA: Diagnosis not present

## 2018-08-29 DIAGNOSIS — Z8551 Personal history of malignant neoplasm of bladder: Secondary | ICD-10-CM | POA: Diagnosis not present

## 2018-08-29 DIAGNOSIS — I471 Supraventricular tachycardia: Secondary | ICD-10-CM | POA: Diagnosis not present

## 2018-08-29 DIAGNOSIS — I129 Hypertensive chronic kidney disease with stage 1 through stage 4 chronic kidney disease, or unspecified chronic kidney disease: Secondary | ICD-10-CM | POA: Diagnosis not present

## 2018-08-29 DIAGNOSIS — Z794 Long term (current) use of insulin: Secondary | ICD-10-CM | POA: Diagnosis not present

## 2018-08-29 DIAGNOSIS — R1310 Dysphagia, unspecified: Secondary | ICD-10-CM | POA: Diagnosis not present

## 2018-08-29 DIAGNOSIS — Z9181 History of falling: Secondary | ICD-10-CM | POA: Diagnosis not present

## 2018-08-29 DIAGNOSIS — N390 Urinary tract infection, site not specified: Secondary | ICD-10-CM | POA: Diagnosis not present

## 2018-08-29 DIAGNOSIS — H25812 Combined forms of age-related cataract, left eye: Secondary | ICD-10-CM | POA: Diagnosis not present

## 2018-08-29 DIAGNOSIS — Z7902 Long term (current) use of antithrombotics/antiplatelets: Secondary | ICD-10-CM | POA: Diagnosis not present

## 2018-08-30 DIAGNOSIS — I4891 Unspecified atrial fibrillation: Secondary | ICD-10-CM | POA: Diagnosis not present

## 2018-08-30 DIAGNOSIS — N183 Chronic kidney disease, stage 3 (moderate): Secondary | ICD-10-CM | POA: Diagnosis not present

## 2018-08-30 DIAGNOSIS — N390 Urinary tract infection, site not specified: Secondary | ICD-10-CM | POA: Diagnosis not present

## 2018-08-30 DIAGNOSIS — E1151 Type 2 diabetes mellitus with diabetic peripheral angiopathy without gangrene: Secondary | ICD-10-CM | POA: Diagnosis not present

## 2018-08-30 DIAGNOSIS — Z9181 History of falling: Secondary | ICD-10-CM | POA: Diagnosis not present

## 2018-08-30 DIAGNOSIS — I69391 Dysphagia following cerebral infarction: Secondary | ICD-10-CM | POA: Diagnosis not present

## 2018-08-30 DIAGNOSIS — I2510 Atherosclerotic heart disease of native coronary artery without angina pectoris: Secondary | ICD-10-CM | POA: Diagnosis not present

## 2018-08-30 DIAGNOSIS — I471 Supraventricular tachycardia: Secondary | ICD-10-CM | POA: Diagnosis not present

## 2018-08-30 DIAGNOSIS — R1310 Dysphagia, unspecified: Secondary | ICD-10-CM | POA: Diagnosis not present

## 2018-08-30 DIAGNOSIS — Z794 Long term (current) use of insulin: Secondary | ICD-10-CM | POA: Diagnosis not present

## 2018-08-30 DIAGNOSIS — Z7982 Long term (current) use of aspirin: Secondary | ICD-10-CM | POA: Diagnosis not present

## 2018-08-30 DIAGNOSIS — Z8551 Personal history of malignant neoplasm of bladder: Secondary | ICD-10-CM | POA: Diagnosis not present

## 2018-08-30 DIAGNOSIS — I129 Hypertensive chronic kidney disease with stage 1 through stage 4 chronic kidney disease, or unspecified chronic kidney disease: Secondary | ICD-10-CM | POA: Diagnosis not present

## 2018-08-30 DIAGNOSIS — Z7902 Long term (current) use of antithrombotics/antiplatelets: Secondary | ICD-10-CM | POA: Diagnosis not present

## 2018-08-31 DIAGNOSIS — I2510 Atherosclerotic heart disease of native coronary artery without angina pectoris: Secondary | ICD-10-CM | POA: Diagnosis not present

## 2018-08-31 DIAGNOSIS — Z794 Long term (current) use of insulin: Secondary | ICD-10-CM | POA: Diagnosis not present

## 2018-08-31 DIAGNOSIS — I129 Hypertensive chronic kidney disease with stage 1 through stage 4 chronic kidney disease, or unspecified chronic kidney disease: Secondary | ICD-10-CM | POA: Diagnosis not present

## 2018-08-31 DIAGNOSIS — Z7982 Long term (current) use of aspirin: Secondary | ICD-10-CM | POA: Diagnosis not present

## 2018-08-31 DIAGNOSIS — Z7902 Long term (current) use of antithrombotics/antiplatelets: Secondary | ICD-10-CM | POA: Diagnosis not present

## 2018-08-31 DIAGNOSIS — I69391 Dysphagia following cerebral infarction: Secondary | ICD-10-CM | POA: Diagnosis not present

## 2018-08-31 DIAGNOSIS — Z8551 Personal history of malignant neoplasm of bladder: Secondary | ICD-10-CM | POA: Diagnosis not present

## 2018-08-31 DIAGNOSIS — I4891 Unspecified atrial fibrillation: Secondary | ICD-10-CM | POA: Diagnosis not present

## 2018-08-31 DIAGNOSIS — E1151 Type 2 diabetes mellitus with diabetic peripheral angiopathy without gangrene: Secondary | ICD-10-CM | POA: Diagnosis not present

## 2018-08-31 DIAGNOSIS — R1310 Dysphagia, unspecified: Secondary | ICD-10-CM | POA: Diagnosis not present

## 2018-08-31 DIAGNOSIS — Z9181 History of falling: Secondary | ICD-10-CM | POA: Diagnosis not present

## 2018-08-31 DIAGNOSIS — I471 Supraventricular tachycardia: Secondary | ICD-10-CM | POA: Diagnosis not present

## 2018-08-31 DIAGNOSIS — N183 Chronic kidney disease, stage 3 (moderate): Secondary | ICD-10-CM | POA: Diagnosis not present

## 2018-08-31 DIAGNOSIS — N390 Urinary tract infection, site not specified: Secondary | ICD-10-CM | POA: Diagnosis not present

## 2018-09-05 DIAGNOSIS — Z7982 Long term (current) use of aspirin: Secondary | ICD-10-CM | POA: Diagnosis not present

## 2018-09-05 DIAGNOSIS — Z794 Long term (current) use of insulin: Secondary | ICD-10-CM | POA: Diagnosis not present

## 2018-09-05 DIAGNOSIS — Z8551 Personal history of malignant neoplasm of bladder: Secondary | ICD-10-CM | POA: Diagnosis not present

## 2018-09-05 DIAGNOSIS — I2510 Atherosclerotic heart disease of native coronary artery without angina pectoris: Secondary | ICD-10-CM | POA: Diagnosis not present

## 2018-09-05 DIAGNOSIS — Z9181 History of falling: Secondary | ICD-10-CM | POA: Diagnosis not present

## 2018-09-05 DIAGNOSIS — I69391 Dysphagia following cerebral infarction: Secondary | ICD-10-CM | POA: Diagnosis not present

## 2018-09-05 DIAGNOSIS — E1151 Type 2 diabetes mellitus with diabetic peripheral angiopathy without gangrene: Secondary | ICD-10-CM | POA: Diagnosis not present

## 2018-09-05 DIAGNOSIS — Z7902 Long term (current) use of antithrombotics/antiplatelets: Secondary | ICD-10-CM | POA: Diagnosis not present

## 2018-09-05 DIAGNOSIS — I471 Supraventricular tachycardia: Secondary | ICD-10-CM | POA: Diagnosis not present

## 2018-09-05 DIAGNOSIS — R1310 Dysphagia, unspecified: Secondary | ICD-10-CM | POA: Diagnosis not present

## 2018-09-05 DIAGNOSIS — N390 Urinary tract infection, site not specified: Secondary | ICD-10-CM | POA: Diagnosis not present

## 2018-09-05 DIAGNOSIS — I129 Hypertensive chronic kidney disease with stage 1 through stage 4 chronic kidney disease, or unspecified chronic kidney disease: Secondary | ICD-10-CM | POA: Diagnosis not present

## 2018-09-05 DIAGNOSIS — I4891 Unspecified atrial fibrillation: Secondary | ICD-10-CM | POA: Diagnosis not present

## 2018-09-05 DIAGNOSIS — N183 Chronic kidney disease, stage 3 (moderate): Secondary | ICD-10-CM | POA: Diagnosis not present

## 2018-09-06 ENCOUNTER — Other Ambulatory Visit: Payer: Self-pay

## 2018-09-06 DIAGNOSIS — Z9181 History of falling: Secondary | ICD-10-CM | POA: Diagnosis not present

## 2018-09-06 DIAGNOSIS — Z7982 Long term (current) use of aspirin: Secondary | ICD-10-CM | POA: Diagnosis not present

## 2018-09-06 DIAGNOSIS — E1151 Type 2 diabetes mellitus with diabetic peripheral angiopathy without gangrene: Secondary | ICD-10-CM | POA: Diagnosis not present

## 2018-09-06 DIAGNOSIS — I4891 Unspecified atrial fibrillation: Secondary | ICD-10-CM | POA: Diagnosis not present

## 2018-09-06 DIAGNOSIS — I129 Hypertensive chronic kidney disease with stage 1 through stage 4 chronic kidney disease, or unspecified chronic kidney disease: Secondary | ICD-10-CM | POA: Diagnosis not present

## 2018-09-06 DIAGNOSIS — I69391 Dysphagia following cerebral infarction: Secondary | ICD-10-CM | POA: Diagnosis not present

## 2018-09-06 DIAGNOSIS — N183 Chronic kidney disease, stage 3 (moderate): Secondary | ICD-10-CM | POA: Diagnosis not present

## 2018-09-06 DIAGNOSIS — I2510 Atherosclerotic heart disease of native coronary artery without angina pectoris: Secondary | ICD-10-CM | POA: Diagnosis not present

## 2018-09-06 DIAGNOSIS — Z8551 Personal history of malignant neoplasm of bladder: Secondary | ICD-10-CM | POA: Diagnosis not present

## 2018-09-06 DIAGNOSIS — N390 Urinary tract infection, site not specified: Secondary | ICD-10-CM | POA: Diagnosis not present

## 2018-09-06 DIAGNOSIS — Z794 Long term (current) use of insulin: Secondary | ICD-10-CM | POA: Diagnosis not present

## 2018-09-06 DIAGNOSIS — R9431 Abnormal electrocardiogram [ECG] [EKG]: Secondary | ICD-10-CM

## 2018-09-06 DIAGNOSIS — Z7902 Long term (current) use of antithrombotics/antiplatelets: Secondary | ICD-10-CM | POA: Diagnosis not present

## 2018-09-06 DIAGNOSIS — I471 Supraventricular tachycardia: Secondary | ICD-10-CM

## 2018-09-06 DIAGNOSIS — R1310 Dysphagia, unspecified: Secondary | ICD-10-CM | POA: Diagnosis not present

## 2018-09-07 DIAGNOSIS — I2510 Atherosclerotic heart disease of native coronary artery without angina pectoris: Secondary | ICD-10-CM | POA: Diagnosis not present

## 2018-09-07 DIAGNOSIS — N390 Urinary tract infection, site not specified: Secondary | ICD-10-CM | POA: Diagnosis not present

## 2018-09-07 DIAGNOSIS — I4891 Unspecified atrial fibrillation: Secondary | ICD-10-CM | POA: Diagnosis not present

## 2018-09-07 DIAGNOSIS — E1151 Type 2 diabetes mellitus with diabetic peripheral angiopathy without gangrene: Secondary | ICD-10-CM | POA: Diagnosis not present

## 2018-09-07 DIAGNOSIS — I69391 Dysphagia following cerebral infarction: Secondary | ICD-10-CM | POA: Diagnosis not present

## 2018-09-07 DIAGNOSIS — N183 Chronic kidney disease, stage 3 (moderate): Secondary | ICD-10-CM | POA: Diagnosis not present

## 2018-09-07 DIAGNOSIS — I471 Supraventricular tachycardia: Secondary | ICD-10-CM | POA: Diagnosis not present

## 2018-09-07 DIAGNOSIS — Z8551 Personal history of malignant neoplasm of bladder: Secondary | ICD-10-CM | POA: Diagnosis not present

## 2018-09-07 DIAGNOSIS — Z9181 History of falling: Secondary | ICD-10-CM | POA: Diagnosis not present

## 2018-09-07 DIAGNOSIS — Z7982 Long term (current) use of aspirin: Secondary | ICD-10-CM | POA: Diagnosis not present

## 2018-09-07 DIAGNOSIS — Z7902 Long term (current) use of antithrombotics/antiplatelets: Secondary | ICD-10-CM | POA: Diagnosis not present

## 2018-09-07 DIAGNOSIS — I129 Hypertensive chronic kidney disease with stage 1 through stage 4 chronic kidney disease, or unspecified chronic kidney disease: Secondary | ICD-10-CM | POA: Diagnosis not present

## 2018-09-07 DIAGNOSIS — Z794 Long term (current) use of insulin: Secondary | ICD-10-CM | POA: Diagnosis not present

## 2018-09-07 DIAGNOSIS — R1310 Dysphagia, unspecified: Secondary | ICD-10-CM | POA: Diagnosis not present

## 2018-09-08 DIAGNOSIS — Z7982 Long term (current) use of aspirin: Secondary | ICD-10-CM | POA: Diagnosis not present

## 2018-09-08 DIAGNOSIS — I129 Hypertensive chronic kidney disease with stage 1 through stage 4 chronic kidney disease, or unspecified chronic kidney disease: Secondary | ICD-10-CM | POA: Diagnosis not present

## 2018-09-08 DIAGNOSIS — N183 Chronic kidney disease, stage 3 (moderate): Secondary | ICD-10-CM | POA: Diagnosis not present

## 2018-09-08 DIAGNOSIS — I471 Supraventricular tachycardia: Secondary | ICD-10-CM | POA: Diagnosis not present

## 2018-09-08 DIAGNOSIS — Z794 Long term (current) use of insulin: Secondary | ICD-10-CM | POA: Diagnosis not present

## 2018-09-08 DIAGNOSIS — Z7902 Long term (current) use of antithrombotics/antiplatelets: Secondary | ICD-10-CM | POA: Diagnosis not present

## 2018-09-08 DIAGNOSIS — R1310 Dysphagia, unspecified: Secondary | ICD-10-CM | POA: Diagnosis not present

## 2018-09-08 DIAGNOSIS — E1151 Type 2 diabetes mellitus with diabetic peripheral angiopathy without gangrene: Secondary | ICD-10-CM | POA: Diagnosis not present

## 2018-09-08 DIAGNOSIS — I2510 Atherosclerotic heart disease of native coronary artery without angina pectoris: Secondary | ICD-10-CM | POA: Diagnosis not present

## 2018-09-08 DIAGNOSIS — Z8551 Personal history of malignant neoplasm of bladder: Secondary | ICD-10-CM | POA: Diagnosis not present

## 2018-09-08 DIAGNOSIS — I4891 Unspecified atrial fibrillation: Secondary | ICD-10-CM | POA: Diagnosis not present

## 2018-09-08 DIAGNOSIS — I69391 Dysphagia following cerebral infarction: Secondary | ICD-10-CM | POA: Diagnosis not present

## 2018-09-08 DIAGNOSIS — N390 Urinary tract infection, site not specified: Secondary | ICD-10-CM | POA: Diagnosis not present

## 2018-09-08 DIAGNOSIS — Z9181 History of falling: Secondary | ICD-10-CM | POA: Diagnosis not present

## 2018-09-11 DIAGNOSIS — H25812 Combined forms of age-related cataract, left eye: Secondary | ICD-10-CM | POA: Diagnosis not present

## 2018-09-11 DIAGNOSIS — H2512 Age-related nuclear cataract, left eye: Secondary | ICD-10-CM | POA: Diagnosis not present

## 2018-09-11 DIAGNOSIS — Z01818 Encounter for other preprocedural examination: Secondary | ICD-10-CM | POA: Diagnosis not present

## 2018-09-11 DIAGNOSIS — Z09 Encounter for follow-up examination after completed treatment for conditions other than malignant neoplasm: Secondary | ICD-10-CM | POA: Diagnosis not present

## 2018-09-11 DIAGNOSIS — H353132 Nonexudative age-related macular degeneration, bilateral, intermediate dry stage: Secondary | ICD-10-CM | POA: Diagnosis not present

## 2018-09-12 ENCOUNTER — Telehealth: Payer: Self-pay

## 2018-09-12 DIAGNOSIS — I69391 Dysphagia following cerebral infarction: Secondary | ICD-10-CM | POA: Diagnosis not present

## 2018-09-12 DIAGNOSIS — N183 Chronic kidney disease, stage 3 (moderate): Secondary | ICD-10-CM | POA: Diagnosis not present

## 2018-09-12 DIAGNOSIS — N390 Urinary tract infection, site not specified: Secondary | ICD-10-CM | POA: Diagnosis not present

## 2018-09-12 DIAGNOSIS — Z8551 Personal history of malignant neoplasm of bladder: Secondary | ICD-10-CM | POA: Diagnosis not present

## 2018-09-12 DIAGNOSIS — R1310 Dysphagia, unspecified: Secondary | ICD-10-CM | POA: Diagnosis not present

## 2018-09-12 DIAGNOSIS — I129 Hypertensive chronic kidney disease with stage 1 through stage 4 chronic kidney disease, or unspecified chronic kidney disease: Secondary | ICD-10-CM | POA: Diagnosis not present

## 2018-09-12 DIAGNOSIS — I4891 Unspecified atrial fibrillation: Secondary | ICD-10-CM | POA: Diagnosis not present

## 2018-09-12 DIAGNOSIS — I471 Supraventricular tachycardia: Secondary | ICD-10-CM | POA: Diagnosis not present

## 2018-09-12 DIAGNOSIS — Z7902 Long term (current) use of antithrombotics/antiplatelets: Secondary | ICD-10-CM | POA: Diagnosis not present

## 2018-09-12 DIAGNOSIS — I2510 Atherosclerotic heart disease of native coronary artery without angina pectoris: Secondary | ICD-10-CM | POA: Diagnosis not present

## 2018-09-12 DIAGNOSIS — E1151 Type 2 diabetes mellitus with diabetic peripheral angiopathy without gangrene: Secondary | ICD-10-CM | POA: Diagnosis not present

## 2018-09-12 DIAGNOSIS — Z794 Long term (current) use of insulin: Secondary | ICD-10-CM | POA: Diagnosis not present

## 2018-09-12 DIAGNOSIS — Z9181 History of falling: Secondary | ICD-10-CM | POA: Diagnosis not present

## 2018-09-12 DIAGNOSIS — Z7982 Long term (current) use of aspirin: Secondary | ICD-10-CM | POA: Diagnosis not present

## 2018-09-12 NOTE — Telephone Encounter (Addendum)
Rn call Rinaldo Cloud NP back about clearance form for cataract surgery. Caryl Pina stated the eliquis does not need to be stop, but pt will have IV sedation. The IV sedation will consist of versed,and fentanyl. They are aware pt had stroke in August 2019. PT is only getting one eye done. Rn stated Dr.SEthi will be in the office on 09/18/2018 seeing pts.Rn stated if the clearance is approve or denied still put it on the form,and fax it. Caryl Pina NP stated the procedure has not  been schedule until she hears from Dr. Leonie Man.She verbalized understanding.

## 2018-09-12 NOTE — Telephone Encounter (Signed)
RN cal [facility] about pt needing clearance form to cataract surgery under IV sedation. The form states anticoagulations does not need to be stop. Rn requested to speak with Rinaldo Cloud NP. Caryl Pina stated she will call me back later. Rn gave her number of [phone].

## 2018-09-12 NOTE — Telephone Encounter (Signed)
NP Caryl Pina returning RNs call, stating she can be reached at [phone] ext [phone]

## 2018-09-12 NOTE — Telephone Encounter (Signed)
Revised. 

## 2018-09-13 DIAGNOSIS — I69391 Dysphagia following cerebral infarction: Secondary | ICD-10-CM | POA: Diagnosis not present

## 2018-09-13 DIAGNOSIS — I471 Supraventricular tachycardia: Secondary | ICD-10-CM | POA: Diagnosis not present

## 2018-09-13 DIAGNOSIS — E1151 Type 2 diabetes mellitus with diabetic peripheral angiopathy without gangrene: Secondary | ICD-10-CM | POA: Diagnosis not present

## 2018-09-13 DIAGNOSIS — Z7982 Long term (current) use of aspirin: Secondary | ICD-10-CM | POA: Diagnosis not present

## 2018-09-13 DIAGNOSIS — Z7902 Long term (current) use of antithrombotics/antiplatelets: Secondary | ICD-10-CM | POA: Diagnosis not present

## 2018-09-13 DIAGNOSIS — N390 Urinary tract infection, site not specified: Secondary | ICD-10-CM | POA: Diagnosis not present

## 2018-09-13 DIAGNOSIS — N183 Chronic kidney disease, stage 3 (moderate): Secondary | ICD-10-CM | POA: Diagnosis not present

## 2018-09-13 DIAGNOSIS — I4891 Unspecified atrial fibrillation: Secondary | ICD-10-CM | POA: Diagnosis not present

## 2018-09-13 DIAGNOSIS — I129 Hypertensive chronic kidney disease with stage 1 through stage 4 chronic kidney disease, or unspecified chronic kidney disease: Secondary | ICD-10-CM | POA: Diagnosis not present

## 2018-09-13 DIAGNOSIS — I2510 Atherosclerotic heart disease of native coronary artery without angina pectoris: Secondary | ICD-10-CM | POA: Diagnosis not present

## 2018-09-13 DIAGNOSIS — Z8551 Personal history of malignant neoplasm of bladder: Secondary | ICD-10-CM | POA: Diagnosis not present

## 2018-09-13 DIAGNOSIS — Z9181 History of falling: Secondary | ICD-10-CM | POA: Diagnosis not present

## 2018-09-13 DIAGNOSIS — R1310 Dysphagia, unspecified: Secondary | ICD-10-CM | POA: Diagnosis not present

## 2018-09-13 DIAGNOSIS — Z794 Long term (current) use of insulin: Secondary | ICD-10-CM | POA: Diagnosis not present

## 2018-09-14 DIAGNOSIS — Z9181 History of falling: Secondary | ICD-10-CM | POA: Diagnosis not present

## 2018-09-14 DIAGNOSIS — I471 Supraventricular tachycardia: Secondary | ICD-10-CM | POA: Diagnosis not present

## 2018-09-14 DIAGNOSIS — I69391 Dysphagia following cerebral infarction: Secondary | ICD-10-CM | POA: Diagnosis not present

## 2018-09-14 DIAGNOSIS — I4891 Unspecified atrial fibrillation: Secondary | ICD-10-CM | POA: Diagnosis not present

## 2018-09-14 DIAGNOSIS — I2510 Atherosclerotic heart disease of native coronary artery without angina pectoris: Secondary | ICD-10-CM | POA: Diagnosis not present

## 2018-09-14 DIAGNOSIS — N183 Chronic kidney disease, stage 3 (moderate): Secondary | ICD-10-CM | POA: Diagnosis not present

## 2018-09-14 DIAGNOSIS — Z8551 Personal history of malignant neoplasm of bladder: Secondary | ICD-10-CM | POA: Diagnosis not present

## 2018-09-14 DIAGNOSIS — R1310 Dysphagia, unspecified: Secondary | ICD-10-CM | POA: Diagnosis not present

## 2018-09-14 DIAGNOSIS — E1151 Type 2 diabetes mellitus with diabetic peripheral angiopathy without gangrene: Secondary | ICD-10-CM | POA: Diagnosis not present

## 2018-09-14 DIAGNOSIS — Z794 Long term (current) use of insulin: Secondary | ICD-10-CM | POA: Diagnosis not present

## 2018-09-14 DIAGNOSIS — N390 Urinary tract infection, site not specified: Secondary | ICD-10-CM | POA: Diagnosis not present

## 2018-09-14 DIAGNOSIS — I129 Hypertensive chronic kidney disease with stage 1 through stage 4 chronic kidney disease, or unspecified chronic kidney disease: Secondary | ICD-10-CM | POA: Diagnosis not present

## 2018-09-14 DIAGNOSIS — Z7982 Long term (current) use of aspirin: Secondary | ICD-10-CM | POA: Diagnosis not present

## 2018-09-14 DIAGNOSIS — Z7902 Long term (current) use of antithrombotics/antiplatelets: Secondary | ICD-10-CM | POA: Diagnosis not present

## 2018-09-18 DIAGNOSIS — Z9181 History of falling: Secondary | ICD-10-CM | POA: Diagnosis not present

## 2018-09-18 DIAGNOSIS — R1310 Dysphagia, unspecified: Secondary | ICD-10-CM | POA: Diagnosis not present

## 2018-09-18 DIAGNOSIS — Z7902 Long term (current) use of antithrombotics/antiplatelets: Secondary | ICD-10-CM | POA: Diagnosis not present

## 2018-09-18 DIAGNOSIS — I4891 Unspecified atrial fibrillation: Secondary | ICD-10-CM | POA: Diagnosis not present

## 2018-09-18 DIAGNOSIS — I2510 Atherosclerotic heart disease of native coronary artery without angina pectoris: Secondary | ICD-10-CM | POA: Diagnosis not present

## 2018-09-18 DIAGNOSIS — I471 Supraventricular tachycardia: Secondary | ICD-10-CM | POA: Diagnosis not present

## 2018-09-18 DIAGNOSIS — Z8551 Personal history of malignant neoplasm of bladder: Secondary | ICD-10-CM | POA: Diagnosis not present

## 2018-09-18 DIAGNOSIS — E1151 Type 2 diabetes mellitus with diabetic peripheral angiopathy without gangrene: Secondary | ICD-10-CM | POA: Diagnosis not present

## 2018-09-18 DIAGNOSIS — N183 Chronic kidney disease, stage 3 (moderate): Secondary | ICD-10-CM | POA: Diagnosis not present

## 2018-09-18 DIAGNOSIS — I69391 Dysphagia following cerebral infarction: Secondary | ICD-10-CM | POA: Diagnosis not present

## 2018-09-18 DIAGNOSIS — Z7982 Long term (current) use of aspirin: Secondary | ICD-10-CM | POA: Diagnosis not present

## 2018-09-18 DIAGNOSIS — Z794 Long term (current) use of insulin: Secondary | ICD-10-CM | POA: Diagnosis not present

## 2018-09-18 DIAGNOSIS — N390 Urinary tract infection, site not specified: Secondary | ICD-10-CM | POA: Diagnosis not present

## 2018-09-18 DIAGNOSIS — I129 Hypertensive chronic kidney disease with stage 1 through stage 4 chronic kidney disease, or unspecified chronic kidney disease: Secondary | ICD-10-CM | POA: Diagnosis not present

## 2018-09-18 NOTE — Telephone Encounter (Signed)
Clearance form fax back to [facility] at [phone] twice and confirmed.  Dr.Sethi has cleared patient medically. PEr Clayton Cloud NP see previous note , pt does not have to stop anticoagulants for this procedure for cataract surgery.

## 2018-09-21 ENCOUNTER — Telehealth: Payer: Self-pay | Admitting: Cardiology

## 2018-09-21 ENCOUNTER — Encounter: Payer: Self-pay | Admitting: Cardiology

## 2018-09-21 ENCOUNTER — Other Ambulatory Visit: Payer: Self-pay | Admitting: Cardiology

## 2018-09-21 ENCOUNTER — Other Ambulatory Visit: Payer: Self-pay

## 2018-09-21 ENCOUNTER — Ambulatory Visit: Payer: Medicare Other | Admitting: Cardiology

## 2018-09-21 VITALS — BP 124/62 | HR 67 | Ht 68.0 in | Wt 199.0 lb

## 2018-09-21 DIAGNOSIS — I472 Ventricular tachycardia: Secondary | ICD-10-CM

## 2018-09-21 DIAGNOSIS — I10 Essential (primary) hypertension: Secondary | ICD-10-CM | POA: Diagnosis not present

## 2018-09-21 DIAGNOSIS — E785 Hyperlipidemia, unspecified: Secondary | ICD-10-CM | POA: Diagnosis not present

## 2018-09-21 DIAGNOSIS — R000 Tachycardia, unspecified: Secondary | ICD-10-CM

## 2018-09-21 DIAGNOSIS — Z79899 Other long term (current) drug therapy: Secondary | ICD-10-CM | POA: Diagnosis not present

## 2018-09-21 MED ORDER — AMIODARONE HCL 200 MG PO TABS: 200.0000 mg | ORAL_TABLET | Freq: Every day | ORAL | 1 refills | Status: DC

## 2018-09-21 MED ORDER — AMIODARONE HCL 200 MG PO TABS: ORAL_TABLET | ORAL | 0 refills | Status: DC

## 2018-09-21 MED ORDER — METOPROLOL TARTRATE 50 MG PO TABS: 50.0000 mg | ORAL_TABLET | Freq: Two times a day (BID) | ORAL | 1 refills | Status: AC

## 2018-09-21 MED ORDER — AMIODARONE HCL 200 MG PO TABS: 200.0000 mg | ORAL_TABLET | Freq: Every day | ORAL | 0 refills | Status: DC

## 2018-09-21 NOTE — Addendum Note (Signed)
Addended by: Stanton Kidney on: 09/21/2018 04:09 PM   Modules accepted: Orders

## 2018-09-21 NOTE — Telephone Encounter (Signed)
Called pharmacy to clarify that both Rx are needed for pt, one for loading dose and the other for additional refills. Pharmacy tech verbalized understanding.

## 2018-09-21 NOTE — Patient Outreach (Signed)
[facility]) Care Management  09/21/2018  Trai Ells Apr 07, 1934 [id number]   Medication Adherence call to Mr. Meyer Cory left a message for patient to call back patient is due on Pravastatin 10 mg. Mr. Mckim is showing past due under [place].    [facility] Management Direct Dial [phone]  Fax [phone] [email or web address]

## 2018-09-21 NOTE — Telephone Encounter (Signed)
 *  STAT* If patient is at the pharmacy, call can be transferred to refill team.   1. Which medications need to be refilled? (please list name of each medication and dose if known)amiodarone (PACERONE) 200 MG tablet  2. Which pharmacy/location (including street and city if local pharmacy) is medication to be sent to? [facility]  3. Do they need a 30 day or 90 day supply? Pharmacist called to question which script to fill for this medication. Stated they have received two different scripts for same med today that does not state the same thing

## 2018-09-21 NOTE — Progress Notes (Signed)
Electrophysiology Office Note   Date:  09/21/2018   ID:  Clayton Johnson, DOB 1934/03/21, MRN [id number]  PCP:  Ronita Hipps, MD  Cardiologist:  Revankar Primary Electrophysiologist:  Keili Hasten Meredith Leeds, MD    No chief complaint on file.    History of Present Illness: Clayton Johnson is a 84 y.o. male who is being seen today for the evaluation of wide complex tachycardia at the request of Sunny Schlein Revankar. Presenting today for electrophysiology evaluation.  He has a history of coronary artery disease post circumflex stent in 2013, diabetes, hyperlipidemia, hypertension, stroke, and bladder cancer.  He is in a wheelchair and also has vision issues due to his stroke.  Were cardiac monitor that showed a wide-complex tachycardia.  He was asymptomatic at the time as the tachycardia occurred at 2:30 in the morning.  He is continuing to have visual issues and some left-sided neglect from his stroke.  He also has dementia which has gotten worse since his stroke.  Today, he denies symptoms of palpitations, chest pain, shortness of breath, orthopnea, PND, lower extremity edema, claudication, dizziness, presyncope, syncope, bleeding, or neurologic sequela. The patient is tolerating medications without difficulties.    Past Medical History:  Diagnosis Date  . Anxiety disorder   . Bladder cancer ([place])   . Coronary artery disease   . Degenerative arthritis   . Diabetes ([place])   . Dyslipidemia   . GERD (gastroesophageal reflux disease)   . Hypertension   . Obesity   . Stroke ([place])   . TIA (transient ischemic attack)    MULTIPLE,BIHEMISPHERIC   Past Surgical History:  Procedure Laterality Date  . BLADDER REPAIR    . CORONARY STENT PLACEMENT    . NASAL SINUS SURGERY    . SHOULDER OPEN ROTATOR CUFF REPAIR Right   . TOTAL KNEE ARTHROPLASTY Right      Current Outpatient Medications  Medication Sig Dispense Refill  . acetaminophen (TYLENOL) 500 MG tablet Take 1,000 mg by mouth  every 6 (six) hours as needed (for pain or headaches).    Marland Kitchen apixaban (ELIQUIS) 2.5 MG TABS tablet Take 1 tablet (2.5 mg total) by mouth 2 (two) times daily. 180 tablet 1  . Calcium Carbonate-Vitamin D (OSCAL 500/200 D-3 PO) Take by mouth.    . cetirizine (ZYRTEC) 10 MG tablet Take 10 mg by mouth daily.    . clopidogrel (PLAVIX) 75 MG tablet Take 75 mg by mouth daily.    Marland Kitchen donepezil (ARICEPT) 10 MG tablet Take 10 mg by mouth at bedtime.    . finasteride (PROSCAR) 5 MG tablet Take 5 mg by mouth at bedtime.    . fluticasone (FLONASE) 50 MCG/ACT nasal spray Place 1-2 sprays into both nostrils daily.    . insulin glargine (LANTUS) 100 UNIT/ML injection Inject 25 Units into the skin daily.    . insulin regular (NOVOLIN R,HUMULIN R) 100 units/mL injection Inject into the skin 3 (three) times daily before meals.    . pravastatin (PRAVACHOL) 10 MG tablet Take 10 mg by mouth daily.    . tamsulosin (FLOMAX) 0.4 MG CAPS capsule Take 0.4 mg by mouth daily after supper.    Marland Kitchen amiodarone (PACERONE) 200 MG tablet Take 1 tablet (200 mg total) by mouth daily. 84 tablet 0  . amiodarone (PACERONE) 200 MG tablet Take 1 tablet (200 mg total) by mouth daily. 90 tablet 1  . metoprolol tartrate (LOPRESSOR) 50 MG tablet Take 1 tablet (50 mg total) by mouth 2 (two)  times daily. 180 tablet 1   Current Facility-Administered Medications  Medication Dose Route Frequency Provider Last Rate Last Dose  . apixaban (ELIQUIS) tablet 2.5 mg  2.5 mg Oral BID Garvin Fila, MD        Allergies:   Lactose intolerance (gi)   Social History:  The patient  reports that he has never smoked. He has never used smokeless tobacco. He reports that he does not drink alcohol or use drugs.   Family History:  The patient's family history includes Heart attack in his brother; Heart disease in his mother; Renal Disease in his sister; Stroke in his father.    ROS:  Please see the history of present illness.   Otherwise, review of systems is  positive for shortness of breath, palpitations, snoring, difficulty urinating, back pain, balance problems, easy bruising.   All other systems are reviewed and negative.    PHYSICAL EXAM: VS:  BP 124/62   Pulse 67   Ht 5\' 8"  (1.727 m)   Wt 199 lb (90.3 kg)   BMI 30.26 kg/m  , BMI Body mass index is 30.26 kg/m. GEN: Well nourished, well developed, in no acute distress  HEENT: normal  Neck: no JVD, carotid bruits, or masses Cardiac: RRR; no murmurs, rubs, or gallops,no edema  Respiratory:  clear to auscultation bilaterally, normal work of breathing GI: soft, nontender, nondistended, + BS MS: no deformity or atrophy  Skin: warm and dry Neuro:  Strength and sensation are intact Psych: euthymic mood, full affect  EKG:  EKG is ordered today. Personal review of the ekg ordered shows sinus rhythm, T wave flattening, rate 67  Recent Labs: 07/16/2018: Magnesium 2.1 07/18/2018: BUN 22; Creatinine, Ser 1.54; Hemoglobin 11.6; Platelets 238; Potassium 3.6; Sodium 138    Lipid Panel     Component Value Date/Time   CHOL 129 07/15/2018 0456   TRIG 130 07/15/2018 0456   HDL 43 07/15/2018 0456   CHOLHDL 3.0 07/15/2018 0456   VLDL 26 07/15/2018 0456   LDLCALC 60 07/15/2018 0456     Wt Readings from Last 3 Encounters:  09/21/18 199 lb (90.3 kg)  08/22/18 201 lb (91.2 kg)  07/25/18 200 lb (90.7 kg)      Other studies Reviewed: Additional studies/ records that were reviewed today include: 30 day monitor 08/07/18 - personally reviewed  Review of the above records today demonstrates:  Baseline rhythm: Sinus  Minimum heart rate: 49 BPM.  Average heart rate: 67 BPM.  Maximal heart rate 112 BPM.  Atrial arrhythmia: None significant  Ventricular arrhythmia: Patient had episodes of wide QRS tachycardia that suggested the possibility of ventricular tachycardia.  The longest was 5 minutes and 31 seconds.  I also took the opinion of our electrophysiology colleague who feels that this could be  atrial flutter  Conduction abnormality: None significant  Symptoms: None   ASSESSMENT AND PLAN:  1.  Wide-complex tachycardia: It appears that his wide-complex tachycardia was due to ventricular tachycardia.  I see no evidence of atrial arrhythmias to this point.  He had an echo in April 2018, but has not had one since.  We Eeshan Verbrugge repeat that.  I Jeannifer Drakeford put him on amiodarone for now, but we may be able to stop this depending on what his ejection fraction looks like.  His ventricular tachycardia was at a rate of 150 bpm.  He is on Eliquis and I have not seen any evidence of atrial arrhythmias that could cause him to have a stroke.  I Voncille Simm discuss this with his primary cardiologist of whether or not we can come off of the Eliquis.  Should we stop the Eliquis, he would likely benefit from Linq monitor implantation.  2.  Hypertension: Currently well controlled.  No changes.  3.  Hyperlipidemia: Continue pravastatin per primary team.    Current medicines are reviewed at length with the patient today.   The patient does not have concerns regarding his medicines.  The following changes were made today:  none  Labs/ tests ordered today include: none Orders Placed This Encounter  Procedures  . TSH  . Hepatic function panel  . EKG 12-Lead   Case discussed with primary cardiologist  Disposition:   FU with Decorian Schuenemann 3 months  Signed, Marvion Bastidas Meredith Leeds, MD  09/21/2018 3:22 PM     [place] [phone] (office) [phone] (fax)

## 2018-09-21 NOTE — Patient Outreach (Signed)
[facility]) Care Management  09/21/2018  Avid Guillette 1934-01-22 [id number]   Medication Adherence call to Mr. Meyer Cory left a message for patient to call back patient is due on Pravastatin 10 mg.Mr. Furches is showing past due under [place] [employer].   [facility] Management Direct Dial [phone]  Fax [phone] [email or web address]

## 2018-09-21 NOTE — Patient Instructions (Signed)
Medication Instructions:  Your physician has recommended you make the following change in your medication:  1. INCREASE your Metoprolol Tartrate to 50 mg twice daily 2. START Amiodarone  Take 2 tablets (400 mg total) twice a day for 2 weeks, then  Take 1 tablet (200 mg total) twice a day for 2 weeks, then  Take 1 tablet (200 mg total) once daily  If you need a refill on your cardiac medications before your next appointment, please call your pharmacy.   Lab work: Today: TSH & LFTs If you have labs (blood work) drawn today and your tests are completely normal, you will receive your results only by: Marland Kitchen MyChart Message (if you have MyChart) OR . A paper copy in the mail If you have any lab test that is abnormal or we need to change your treatment, we will call you to review the results.  Testing/Procedures: None ordered  Follow-Up: At [facility], you and your health needs are our priority.  As part of our continuing mission to provide you with exceptional heart care, we have created designated Provider Care Teams.  These Care Teams include your primary Cardiologist (physician) and Advanced Practice Providers (APPs -  Physician Assistants and Nurse Practitioners) who all work together to provide you with the care you need, when you need it. You will need a follow up appointment in 3 months.  Please call our office 2 months in advance to schedule this appointment.  You may see Dr. Curt Bears or one of the following Advanced Practice Providers on your designated Care Team:   Chanetta Marshall, NP . Tommye Standard, PA-C  Any Other Special Instructions Will Be Listed Below (If Applicable).  Amiodarone tablets What is this medicine? AMIODARONE (a MEE oh da rone) is an antiarrhythmic drug. It helps make your heart beat regularly. Because of the side effects caused by this medicine, it is only used when other medicines have not worked. It is usually used for heartbeat problems that may be life  threatening. This medicine may be used for other purposes; ask your health care provider or pharmacist if you have questions. COMMON BRAND NAME(S): Cordarone, Pacerone What should I tell my health care provider before I take this medicine? They need to know if you have any of these conditions: -liver disease -lung disease -other heart problems -thyroid disease -an unusual or allergic reaction to amiodarone, iodine, other medicines, foods, dyes, or preservatives -pregnant or trying to get pregnant -breast-feeding How should I use this medicine? Take this medicine by mouth with a glass of water. Follow the directions on the prescription label. You can take this medicine with or without food. However, you should always take it the same way each time. Take your doses at regular intervals. Do not take your medicine more often than directed. Do not stop taking except on the advice of your doctor or health care professional. A special MedGuide will be given to you by the pharmacist with each prescription and refill. Be sure to read this information carefully each time. Talk to your pediatrician regarding the use of this medicine in children. Special care may be needed. Overdosage: If you think you have taken too much of this medicine contact a poison control center or emergency room at once. NOTE: This medicine is only for you. Do not share this medicine with others. What if I miss a dose? If you miss a dose, take it as soon as you can. If it is almost time for your next  dose, take only that dose. Do not take double or extra doses. What may interact with this medicine? Do not take this medicine with any of the following medications: -abarelix -apomorphine -arsenic trioxide -certain antibiotics like erythromycin, gemifloxacin, levofloxacin, pentamidine -certain medicines for depression like amoxapine, tricyclic antidepressants -certain medicines for fungal infections like fluconazole,  itraconazole, ketoconazole, posaconazole, voriconazole -certain medicines for irregular heart beat like disopyramide, dofetilide, dronedarone, ibutilide, propafenone, sotalol -certain medicines for malaria like chloroquine, halofantrine -cisapride -droperidol -haloperidol -hawthorn -maprotiline -methadone -phenothiazines like chlorpromazine, mesoridazine, thioridazine -pimozide -ranolazine -red yeast rice -vardenafil -ziprasidone This medicine may also interact with the following medications: -antiviral medicines for HIV or AIDS -certain medicines for blood pressure, heart disease, irregular heart beat -certain medicines for cholesterol like atorvastatin, cerivastatin, lovastatin, simvastatin -certain medicines for hepatitis C like sofosbuvir and ledipasvir; sofosbuvir -certain medicines for seizures like phenytoin -certain medicines for thyroid problems -certain medicines that treat or prevent blood clots like warfarin -cholestyramine -cimetidine -clopidogrel -cyclosporine -dextromethorphan -diuretics -fentanyl -general anesthetics -grapefruit juice -lidocaine -loratadine -methotrexate -other medicines that prolong the QT interval (cause an abnormal heart rhythm) -procainamide -quinidine -rifabutin, rifampin, or rifapentine -St. John's Wort -trazodone This list may not describe all possible interactions. Give your health care provider a list of all the medicines, herbs, non-prescription drugs, or dietary supplements you use. Also tell them if you smoke, drink alcohol, or use illegal drugs. Some items may interact with your medicine. What should I watch for while using this medicine? Your condition will be monitored closely when you first begin therapy. Often, this drug is first started in a hospital or other monitored health care setting. Once you are on maintenance therapy, visit your doctor or health care professional for regular checks on your progress. Because your  condition and use of this medicine carry some risk, it is a good idea to carry an identification card, necklace or bracelet with details of your condition, medications, and doctor or health care professional. Clayton Johnson may get drowsy or dizzy. Do not drive, use machinery, or do anything that needs mental alertness until you know how this medicine affects you. Do not stand or sit up quickly, especially if you are an older patient. This reduces the risk of dizzy or fainting spells. This medicine can make you more sensitive to the sun. Keep out of the sun. If you cannot avoid being in the sun, wear protective clothing and use sunscreen. Do not use sun lamps or tanning beds/booths. You should have regular eye exams before and during treatment. Call your doctor if you have blurred vision, see halos, or your eyes become sensitive to light. Your eyes may get dry. It may be helpful to use a lubricating eye solution or artificial tears solution. If you are going to have surgery or a procedure that requires contrast dyes, tell your doctor or health care professional that you are taking this medicine. What side effects may I notice from receiving this medicine? Side effects that you should report to your doctor or health care professional as soon as possible: -allergic reactions like skin rash, itching or hives, swelling of the face, lips, or tongue -blue-gray coloring of the skin -blurred vision, seeing blue green halos, increased sensitivity of the eyes to light -breathing problems -chest pain -dark urine -fast, irregular heartbeat -feeling faint or light-headed -intolerance to heat or cold -nausea or vomiting -pain and swelling of the scrotum -pain, tingling, numbness in feet, hands -redness, blistering, peeling or loosening of the skin, including inside the mouth -  spitting up blood -stomach pain -sweating -unusual or uncontrolled movements of body -unusually weak or tired -weight gain or loss -yellowing  of the eyes or skin Side effects that usually do not require medical attention (report to your doctor or health care professional if they continue or are bothersome): -change in sex drive or performance -constipation -dizziness -headache -loss of appetite -trouble sleeping This list may not describe all possible side effects. Call your doctor for medical advice about side effects. You may report side effects to FDA at 1-800-FDA-1088. Where should I keep my medicine? Keep out of the reach of children. Store at room temperature between 20 and 25 degrees C (68 and 77 degrees F). Protect from light. Keep container tightly closed. Throw away any unused medicine after the expiration date. NOTE: This sheet is a summary. It may not cover all possible information. If you have questions about this medicine, talk to your doctor, pharmacist, or health care provider.  2018 Elsevier/Gold Standard (2014-02-18 19:48:11)

## 2018-09-22 ENCOUNTER — Telehealth: Payer: Self-pay | Admitting: *Deleted

## 2018-09-22 LAB — HEPATIC FUNCTION PANEL
ALK PHOS: 98 IU/L (ref 39–117)
ALT: 18 IU/L (ref 0–44)
AST: 17 IU/L (ref 0–40)
Albumin: 3.9 g/dL (ref 3.5–4.7)
BILIRUBIN TOTAL: 0.4 mg/dL (ref 0.0–1.2)
BILIRUBIN, DIRECT: 0.11 mg/dL (ref 0.00–0.40)
TOTAL PROTEIN: 6.5 g/dL (ref 6.0–8.5)

## 2018-09-22 LAB — TSH: TSH: 1.75 u[IU]/mL (ref 0.450–4.500)

## 2018-09-22 NOTE — Telephone Encounter (Signed)
Patient was seen by Dr. Curt Bears on Thursday, 09/21/18 and wanted to know if it was neccessary to follow up with Dr. Agustin Cree on Monday, 09/25/18. Dr. Agustin Cree advised to cancel appointment and reschedule for follow up in 1 month. Patient has been rescheduled for an appointment on Tuesday, 11/07/18 at 3:20 pm. Patient's daughter, Lenna Sciara, informed per DPR and verbalized understanding. No further questions.

## 2018-09-25 ENCOUNTER — Ambulatory Visit: Payer: Medicare Other | Admitting: Cardiology

## 2018-09-26 DIAGNOSIS — I2510 Atherosclerotic heart disease of native coronary artery without angina pectoris: Secondary | ICD-10-CM | POA: Diagnosis not present

## 2018-09-26 DIAGNOSIS — Z794 Long term (current) use of insulin: Secondary | ICD-10-CM | POA: Diagnosis not present

## 2018-09-26 DIAGNOSIS — Z7982 Long term (current) use of aspirin: Secondary | ICD-10-CM | POA: Diagnosis not present

## 2018-09-26 DIAGNOSIS — I129 Hypertensive chronic kidney disease with stage 1 through stage 4 chronic kidney disease, or unspecified chronic kidney disease: Secondary | ICD-10-CM | POA: Diagnosis not present

## 2018-09-26 DIAGNOSIS — Z8551 Personal history of malignant neoplasm of bladder: Secondary | ICD-10-CM | POA: Diagnosis not present

## 2018-09-26 DIAGNOSIS — N183 Chronic kidney disease, stage 3 (moderate): Secondary | ICD-10-CM | POA: Diagnosis not present

## 2018-09-26 DIAGNOSIS — I4891 Unspecified atrial fibrillation: Secondary | ICD-10-CM | POA: Diagnosis not present

## 2018-09-26 DIAGNOSIS — I69391 Dysphagia following cerebral infarction: Secondary | ICD-10-CM | POA: Diagnosis not present

## 2018-09-26 DIAGNOSIS — Z7902 Long term (current) use of antithrombotics/antiplatelets: Secondary | ICD-10-CM | POA: Diagnosis not present

## 2018-09-26 DIAGNOSIS — R1310 Dysphagia, unspecified: Secondary | ICD-10-CM | POA: Diagnosis not present

## 2018-09-26 DIAGNOSIS — N390 Urinary tract infection, site not specified: Secondary | ICD-10-CM | POA: Diagnosis not present

## 2018-09-26 DIAGNOSIS — I471 Supraventricular tachycardia: Secondary | ICD-10-CM | POA: Diagnosis not present

## 2018-09-26 DIAGNOSIS — E1151 Type 2 diabetes mellitus with diabetic peripheral angiopathy without gangrene: Secondary | ICD-10-CM | POA: Diagnosis not present

## 2018-09-26 DIAGNOSIS — Z9181 History of falling: Secondary | ICD-10-CM | POA: Diagnosis not present

## 2018-09-28 DIAGNOSIS — N390 Urinary tract infection, site not specified: Secondary | ICD-10-CM | POA: Diagnosis not present

## 2018-09-28 DIAGNOSIS — I129 Hypertensive chronic kidney disease with stage 1 through stage 4 chronic kidney disease, or unspecified chronic kidney disease: Secondary | ICD-10-CM | POA: Diagnosis not present

## 2018-09-28 DIAGNOSIS — E1151 Type 2 diabetes mellitus with diabetic peripheral angiopathy without gangrene: Secondary | ICD-10-CM | POA: Diagnosis not present

## 2018-09-28 DIAGNOSIS — I69391 Dysphagia following cerebral infarction: Secondary | ICD-10-CM | POA: Diagnosis not present

## 2018-09-28 DIAGNOSIS — I471 Supraventricular tachycardia: Secondary | ICD-10-CM | POA: Diagnosis not present

## 2018-09-28 DIAGNOSIS — I2510 Atherosclerotic heart disease of native coronary artery without angina pectoris: Secondary | ICD-10-CM | POA: Diagnosis not present

## 2018-09-28 DIAGNOSIS — I4891 Unspecified atrial fibrillation: Secondary | ICD-10-CM | POA: Diagnosis not present

## 2018-09-28 DIAGNOSIS — N183 Chronic kidney disease, stage 3 (moderate): Secondary | ICD-10-CM | POA: Diagnosis not present

## 2018-09-28 DIAGNOSIS — Z9181 History of falling: Secondary | ICD-10-CM | POA: Diagnosis not present

## 2018-09-28 DIAGNOSIS — Z7902 Long term (current) use of antithrombotics/antiplatelets: Secondary | ICD-10-CM | POA: Diagnosis not present

## 2018-09-28 DIAGNOSIS — Z8551 Personal history of malignant neoplasm of bladder: Secondary | ICD-10-CM | POA: Diagnosis not present

## 2018-09-28 DIAGNOSIS — R1310 Dysphagia, unspecified: Secondary | ICD-10-CM | POA: Diagnosis not present

## 2018-09-28 DIAGNOSIS — Z7982 Long term (current) use of aspirin: Secondary | ICD-10-CM | POA: Diagnosis not present

## 2018-09-28 DIAGNOSIS — Z794 Long term (current) use of insulin: Secondary | ICD-10-CM | POA: Diagnosis not present

## 2018-10-11 DIAGNOSIS — H25812 Combined forms of age-related cataract, left eye: Secondary | ICD-10-CM | POA: Diagnosis not present

## 2018-10-11 DIAGNOSIS — H2512 Age-related nuclear cataract, left eye: Secondary | ICD-10-CM | POA: Diagnosis not present

## 2018-10-12 ENCOUNTER — Other Ambulatory Visit: Payer: Self-pay | Admitting: Cardiology

## 2018-10-13 ENCOUNTER — Other Ambulatory Visit: Payer: Self-pay

## 2018-10-13 ENCOUNTER — Telehealth: Payer: Self-pay | Admitting: *Deleted

## 2018-10-13 MED ORDER — AMIODARONE HCL 200 MG PO TABS: 200.0000 mg | ORAL_TABLET | Freq: Every day | ORAL | 3 refills | Status: AC

## 2018-10-13 NOTE — Telephone Encounter (Signed)
lmtcb

## 2018-10-13 NOTE — Telephone Encounter (Addendum)
-----   Message from Clayton Meredith Leeds, MD sent at 10/05/2018  3:25 PM EST ----- Plan for LINQ implant to assess AF post CVA.   ----- Message ----- From: Jenean Lindau, MD Sent: 10/05/2018  10:43 AM EST To: Clayton Meredith Leeds, MD  This is a complex patient in terms of management.  To start with there was an issue of possible atrial flutter while he was in the hospital.  He has had multiple issues with stroke.  This is the reason he was referred to [place].  I ordered a event monitor and I sent some of the strips to you and you opined that this could be possibly atrial flutter with aberrant conduction.  The question of anticoagulation is difficult in this patient.  On one hand he has had a history of stroke with no clearly documented atrial flutter or fibrillation.  His event monitor suggested the possibility of atrial flutter with aberrant conduction.  On the other hand with anticoagulation he is a gentleman with high fall risk.  These are all the complexities with which I needed help which is why I sent him to you.  I do not have a definitive answer about issues pertaining to anticoagulation and need your help here.  As a middle ground we might be able to insert ILR as you mentioned.  We can then consider two possibilities.  One is to continue him on anticoagulation for a few months and check for the occurrence of atrial tachyarrhythmias.  He is on amiodarone therapy.  In the absence of tachyarrhythmias we could revisit the benefit risk ratio and take a call at that time.  The second position is to stop his anticoagulation and do the long-term monitoring as you have suggested.  Please help me making this complex decision and I seek your expertise on this issue. Thank you doc. ----- Message ----- From: Constance Haw, MD Sent: 09/21/2018   3:23 PM EST To: Jenean Lindau, MD  Thanks for sending Mr. Parcell over.  In reviewing his monitor, I do think it is probably ventricular tachycardia.  I am  repeating his echocardiogram to further determine if he has any structural abnormalities.  I have not seen any evidence of atrial arrhythmias that would put him at risk of stroke, though he is on Eliquis.  Have you seen anything and should we stop the Eliquis and implant a Linq?

## 2018-10-30 ENCOUNTER — Telehealth: Payer: Self-pay

## 2018-10-30 ENCOUNTER — Ambulatory Visit: Payer: Medicare Other | Admitting: Neurology

## 2018-10-30 NOTE — Telephone Encounter (Signed)
Patient no show for appt today. 

## 2018-10-31 ENCOUNTER — Encounter: Payer: Self-pay | Admitting: Neurology

## 2018-11-02 DIAGNOSIS — Z961 Presence of intraocular lens: Secondary | ICD-10-CM | POA: Diagnosis not present

## 2018-11-07 ENCOUNTER — Encounter: Payer: Self-pay | Admitting: Cardiology

## 2018-11-07 ENCOUNTER — Ambulatory Visit (INDEPENDENT_AMBULATORY_CARE_PROVIDER_SITE_OTHER): Payer: Medicare Other | Admitting: Cardiology

## 2018-11-07 VITALS — BP 120/64 | HR 69 | Ht 68.5 in | Wt 203.2 lb

## 2018-11-07 DIAGNOSIS — I2510 Atherosclerotic heart disease of native coronary artery without angina pectoris: Secondary | ICD-10-CM | POA: Diagnosis not present

## 2018-11-07 DIAGNOSIS — I10 Essential (primary) hypertension: Secondary | ICD-10-CM | POA: Diagnosis not present

## 2018-11-07 DIAGNOSIS — I472 Ventricular tachycardia: Secondary | ICD-10-CM

## 2018-11-07 DIAGNOSIS — I6930 Unspecified sequelae of cerebral infarction: Secondary | ICD-10-CM

## 2018-11-07 DIAGNOSIS — E782 Mixed hyperlipidemia: Secondary | ICD-10-CM | POA: Diagnosis not present

## 2018-11-07 DIAGNOSIS — F0150 Vascular dementia without behavioral disturbance: Secondary | ICD-10-CM

## 2018-11-07 DIAGNOSIS — I4729 Other ventricular tachycardia: Secondary | ICD-10-CM

## 2018-11-07 NOTE — Progress Notes (Signed)
Cardiology Office Note:    Date:  11/07/2018   ID:  Clayton Johnson, DOB [birth year], MRN [id number]  PCP:  Ronita Hipps, MD  Cardiologist:  Jenne Campus, MD    Referring MD: Ronita Hipps, MD   Chief Complaint  Patient presents with  . 2 month follow up  Doing fair  History of Present Illness:    Clayton Johnson is a 84 y.o. male with very complex past medical history apparently noted long ago he was in the hospital because of CVA.  Still is somewhat confusing there is documentation of a atrial flutter questionable while in the hospital I did review old EKG from the hospital I see one EKG as showing normal sinus rhythm with APCs with computer interpreted as atrial fibrillation.  He was discharged home from the hospital and then later he was put on anticoagulation interestingly he was seen by my partner who put monitor on him for 30 days return to see if there is any evidence of atrial fibrillation he was find to have nonsustained ventricle tachycardia and after that he was seen by our EP team with consideration of ventricular tachycardia he was initiated with amiodarone.  Since that time he seems to be doing well denies having any chest pain tightness squeezing pressure burning chest in the meantime he is seen neurology team that recommended continuation of anticoagulation.  I think what can help with this a quite difficult situation with echocardiogram which tells [place] what the left atrial size is in couples to make a determination if we need to continue with anticoagulation  Past Medical History:  Diagnosis Date  . Anxiety disorder   . Bladder cancer ([place])   . Coronary artery disease   . Degenerative arthritis   . Diabetes ([place])   . Dyslipidemia   . GERD (gastroesophageal reflux disease)   . Hypertension   . Obesity   . Stroke ([place])   . TIA (transient ischemic attack)    MULTIPLE,BIHEMISPHERIC    Past Surgical History:  Procedure Laterality Date  . BLADDER REPAIR     . CORONARY STENT PLACEMENT    . NASAL SINUS SURGERY    . SHOULDER OPEN ROTATOR CUFF REPAIR Right   . TOTAL KNEE ARTHROPLASTY Right     Current Medications: Current Meds  Medication Sig  . acetaminophen (TYLENOL) 500 MG tablet Take 1,000 mg by mouth every 6 (six) hours as needed (for pain or headaches).  Marland Kitchen amiodarone (PACERONE) 200 MG tablet Take 1 tablet (200 mg total) by mouth daily.  Marland Kitchen apixaban (ELIQUIS) 2.5 MG TABS tablet Take 1 tablet (2.5 mg total) by mouth 2 (two) times daily.  . Calcium Carbonate-Vitamin D (OSCAL 500/200 D-3 PO) Take by mouth.  . cetirizine (ZYRTEC) 10 MG tablet Take 10 mg by mouth daily.  . clopidogrel (PLAVIX) 75 MG tablet Take 75 mg by mouth daily.  Marland Kitchen donepezil (ARICEPT) 10 MG tablet Take 10 mg by mouth at bedtime.  . finasteride (PROSCAR) 5 MG tablet Take 5 mg by mouth at bedtime.  . fluticasone (FLONASE) 50 MCG/ACT nasal spray Place 1-2 sprays into both nostrils daily.  . insulin glargine (LANTUS) 100 UNIT/ML injection Inject 25 Units into the skin daily.  . insulin regular (NOVOLIN R,HUMULIN R) 100 units/mL injection Inject into the skin 3 (three) times daily before meals.  . metoprolol tartrate (LOPRESSOR) 50 MG tablet Take 1 tablet (50 mg total) by mouth 2 (two) times daily.  . pravastatin (PRAVACHOL) 10 MG tablet  Take 10 mg by mouth daily.  . tamsulosin (FLOMAX) 0.4 MG CAPS capsule Take 0.4 mg by mouth daily after supper.   Current Facility-Administered Medications for the 11/07/18 encounter (Office Visit) with Park Liter, MD  Medication  . apixaban (ELIQUIS) tablet 2.5 mg     Allergies:   Lactose intolerance (gi)   Social History   Socioeconomic History  . Marital status: Divorced    Spouse name: Not on file  . Number of children: 3  . Years of education: 12+  . Highest education level: Not on file  Occupational History  . Occupation: RETIRED  Social Needs  . Financial resource strain: Not on file  . Food insecurity:    Worry:  Not on file    Inability: Not on file  . Transportation needs:    Medical: Not on file    Non-medical: Not on file  Tobacco Use  . Smoking status: Never Smoker  . Smokeless tobacco: Never Used  Substance and Sexual Activity  . Alcohol use: No  . Drug use: No  . Sexual activity: Not on file  Lifestyle  . Physical activity:    Days per week: Not on file    Minutes per session: Not on file  . Stress: Not on file  Relationships  . Social connections:    Talks on phone: Not on file    Gets together: Not on file    Attends religious service: Not on file    Active member of club or organization: Not on file    Attends meetings of clubs or organizations: Not on file    Relationship status: Not on file  Other Topics Concern  . Not on file  Social History Narrative   Lives at home alone   Right-handed   Caffeine: diet Mt Dew     Family History: The patient's family history includes Heart attack in his brother; Heart disease in his mother; Renal Disease in his sister; Stroke in his father. ROS:   Please see the history of present illness.    All 14 point review of systems negative except as described per history of present illness  EKGs/Labs/Other Studies Reviewed:      Recent Labs: 07/16/2018: Magnesium 2.1 07/18/2018: BUN 22; Creatinine, Ser 1.54; Hemoglobin 11.6; Platelets 238; Potassium 3.6; Sodium 138 09/21/2018: ALT 18; TSH 1.750  Recent Lipid Panel    Component Value Date/Time   CHOL 129 07/15/2018 0456   TRIG 130 07/15/2018 0456   HDL 43 07/15/2018 0456   CHOLHDL 3.0 07/15/2018 0456   VLDL 26 07/15/2018 0456   LDLCALC 60 07/15/2018 0456    Physical Exam:    VS:  BP 120/64   Pulse 69   Ht 5' 8.5" (1.74 m)   Wt 203 lb 3.2 oz (92.2 kg)   SpO2 96%   BMI 30.45 kg/m     Wt Readings from Last 3 Encounters:  11/07/18 203 lb 3.2 oz (92.2 kg)  09/21/18 199 lb (90.3 kg)  08/22/18 201 lb (91.2 kg)     GEN:  Well nourished, well developed in no acute  distress HEENT: Normal NECK: No JVD; No carotid bruits LYMPHATICS: No lymphadenopathy CARDIAC: RRR, no murmurs, no rubs, no gallops RESPIRATORY:  Clear to auscultation without rales, wheezing or rhonchi  ABDOMEN: Soft, non-tender, non-distended MUSCULOSKELETAL:  No edema; No deformity  SKIN: Warm and dry LOWER EXTREMITIES: no swelling NEUROLOGIC:  Alert and oriented x 3 PSYCHIATRIC:  Normal affect   ASSESSMENT:  1. Coronary artery disease involving native coronary artery of native heart without angina pectoris   2. Essential hypertension   3. Late effect of cerebrovascular accident (CVA)   4. Vascular dementia without behavioral disturbance ([place])   5. Mixed hyperlipidemia   6. Nonsustained ventricular tachycardia (HCC)    PLAN:    In order of problems listed above:  1. Coronary artery disease.  Stable on appropriate medication which I will continue 2. Essential hypertension blood pressure well controlled continue present management. 3. Late effect of CVA stable. 4. Nonsustained ventricle tachycardia continue with amiodarone I will schedule him to have echocardiogram to assess left ventricular ejection fraction left atrial size 5. Questionable atrial fibrillation so far I do not see any particular documentation of it we will get echocardiogram to left look at the left atrial size which hopefully help with determination of need for a anticoagulation   Medication Adjustments/Labs and Tests Ordered: Current medicines are reviewed at length with the patient today.  Concerns regarding medicines are outlined above.  Orders Placed This Encounter  Procedures  . ECHOCARDIOGRAM COMPLETE   Medication changes: No orders of the defined types were placed in this encounter.   Signed, Park Liter, MD, [facility] 11/07/2018 5:01 PM    [place]

## 2018-11-07 NOTE — Patient Instructions (Signed)
Medication Instructions:  Your physician recommends that you continue on your current medications as directed. Please refer to the Current Medication list given to you today.  If you need a refill on your cardiac medications before your next appointment, please call your pharmacy.   Lab work: None.  If you have labs (blood work) drawn today and your tests are completely normal, you will receive your results only by: Marland Kitchen MyChart Message (if you have MyChart) OR . A paper copy in the mail If you have any lab test that is abnormal or we need to change your treatment, we will call you to review the results.  Testing/Procedures: Your physician has requested that you have an echocardiogram. Echocardiography is a painless test that uses sound waves to create images of your heart. It provides your doctor with information about the size and shape of your heart and how well your heart's chambers and valves are working. This procedure takes approximately one hour. There are no restrictions for this procedure.    Follow-Up: At [facility], you and your health needs are our priority.  As part of our continuing mission to provide you with exceptional heart care, we have created designated Provider Care Teams.  These Care Teams include your primary Cardiologist (physician) and Advanced Practice Providers (APPs -  Physician Assistants and Nurse Practitioners) who all work together to provide you with the care you need, when you need it. You will need a follow up appointment in 1 months.  Please call our office 2 months in advance to schedule this appointment.  You may see Jenne Campus, MD or another member of our [place] Provider Team in [place]: Shirlee More, MD . Jyl Heinz, MD  Any Other Special Instructions Will Be Listed Below (If Applicable).  Echocardiogram An echocardiogram, or echocardiography, uses sound waves (ultrasound) to produce an image of your heart. The echocardiogram is  simple, painless, obtained within a short period of time, and offers valuable information to your health care provider. The images from an echocardiogram can provide information such as:  Evidence of coronary artery disease (CAD).  Heart size.  Heart muscle function.  Heart valve function.  Aneurysm detection.  Evidence of a past heart attack.  Fluid buildup around the heart.  Heart muscle thickening.  Assess heart valve function.  Tell a health care provider about:  Any allergies you have.  All medicines you are taking, including vitamins, herbs, eye drops, creams, and over-the-counter medicines.  Any problems you or family members have had with anesthetic medicines.  Any blood disorders you have.  Any surgeries you have had.  Any medical conditions you have.  Whether you are pregnant or may be pregnant. What happens before the procedure? No special preparation is needed. Eat and drink normally. What happens during the procedure?  In order to produce an image of your heart, gel will be applied to your chest and a wand-like tool (transducer) will be moved over your chest. The gel will help transmit the sound waves from the transducer. The sound waves will harmlessly bounce off your heart to allow the heart images to be captured in real-time motion. These images will then be recorded.  You may need an IV to receive a medicine that improves the quality of the pictures. What happens after the procedure? You may return to your normal schedule including diet, activities, and medicines, unless your health care provider tells you otherwise. This information is not intended to replace advice given to you  by your health care provider. Make sure you discuss any questions you have with your health care provider. Document Released: 11/12/2000 Document Revised: 07/03/2016 Document Reviewed: 07/23/2013 Elsevier Interactive Patient Education  2017 Elsevier Inc.    

## 2018-11-10 NOTE — Telephone Encounter (Signed)
lmtcb

## 2018-11-15 DIAGNOSIS — E1165 Type 2 diabetes mellitus with hyperglycemia: Secondary | ICD-10-CM | POA: Diagnosis not present

## 2018-11-15 DIAGNOSIS — I10 Essential (primary) hypertension: Secondary | ICD-10-CM | POA: Diagnosis not present

## 2018-11-15 DIAGNOSIS — E785 Hyperlipidemia, unspecified: Secondary | ICD-10-CM | POA: Diagnosis not present

## 2018-11-15 DIAGNOSIS — Z79899 Other long term (current) drug therapy: Secondary | ICD-10-CM | POA: Diagnosis not present

## 2018-11-24 ENCOUNTER — Other Ambulatory Visit: Payer: Medicare Other

## 2018-11-27 NOTE — Telephone Encounter (Signed)
lmtcb  (have left several messages.  If they don't return call soon, send letter)

## 2018-12-08 ENCOUNTER — Encounter: Payer: Self-pay | Admitting: *Deleted

## 2018-12-08 ENCOUNTER — Ambulatory Visit: Payer: Medicare Other | Admitting: Cardiology

## 2018-12-08 NOTE — Telephone Encounter (Signed)
Letter mailed asking pt to call the office

## 2018-12-25 DIAGNOSIS — C679 Malignant neoplasm of bladder, unspecified: Secondary | ICD-10-CM | POA: Diagnosis not present

## 2018-12-26 DIAGNOSIS — C679 Malignant neoplasm of bladder, unspecified: Secondary | ICD-10-CM | POA: Diagnosis not present

## 2018-12-26 DIAGNOSIS — Z8551 Personal history of malignant neoplasm of bladder: Secondary | ICD-10-CM | POA: Diagnosis not present

## 2018-12-27 ENCOUNTER — Telehealth: Payer: Self-pay | Admitting: Cardiology

## 2018-12-27 NOTE — Telephone Encounter (Signed)
Returned call to dtr who apologized that no one had gotten in touch with office before now.  Explained that since pt is scheduled for f/u on Monday w/ [place] we can wait and discuss at that visit. Gave dtr information on ILR for her to research before appt next week. Dtr is agreeable to plan.

## 2018-12-27 NOTE — Telephone Encounter (Signed)
Clayton Johnson, is returning call and letter on behave of her father Clayton Johnson to nurse.

## 2019-01-01 ENCOUNTER — Ambulatory Visit: Payer: Medicare Other | Admitting: Cardiology

## 2019-01-08 DIAGNOSIS — R413 Other amnesia: Secondary | ICD-10-CM | POA: Diagnosis not present

## 2019-01-08 DIAGNOSIS — E1165 Type 2 diabetes mellitus with hyperglycemia: Secondary | ICD-10-CM | POA: Diagnosis not present

## 2019-03-23 DIAGNOSIS — N390 Urinary tract infection, site not specified: Secondary | ICD-10-CM | POA: Diagnosis not present

## 2019-04-19 DIAGNOSIS — E1169 Type 2 diabetes mellitus with other specified complication: Secondary | ICD-10-CM | POA: Diagnosis not present

## 2019-04-19 DIAGNOSIS — B351 Tinea unguium: Secondary | ICD-10-CM | POA: Diagnosis not present

## 2019-04-19 DIAGNOSIS — M2042 Other hammer toe(s) (acquired), left foot: Secondary | ICD-10-CM | POA: Diagnosis not present

## 2019-05-22 DIAGNOSIS — I10 Essential (primary) hypertension: Secondary | ICD-10-CM | POA: Diagnosis not present

## 2019-05-22 DIAGNOSIS — E1165 Type 2 diabetes mellitus with hyperglycemia: Secondary | ICD-10-CM | POA: Diagnosis not present

## 2019-05-22 DIAGNOSIS — K219 Gastro-esophageal reflux disease without esophagitis: Secondary | ICD-10-CM | POA: Diagnosis not present

## 2019-05-22 DIAGNOSIS — M1990 Unspecified osteoarthritis, unspecified site: Secondary | ICD-10-CM | POA: Diagnosis not present

## 2019-05-22 DIAGNOSIS — R5381 Other malaise: Secondary | ICD-10-CM | POA: Diagnosis not present

## 2019-05-22 DIAGNOSIS — I252 Old myocardial infarction: Secondary | ICD-10-CM | POA: Diagnosis not present

## 2019-05-22 DIAGNOSIS — I2510 Atherosclerotic heart disease of native coronary artery without angina pectoris: Secondary | ICD-10-CM | POA: Diagnosis not present

## 2019-06-04 DIAGNOSIS — N390 Urinary tract infection, site not specified: Secondary | ICD-10-CM | POA: Diagnosis not present

## 2019-06-07 DIAGNOSIS — E1165 Type 2 diabetes mellitus with hyperglycemia: Secondary | ICD-10-CM | POA: Diagnosis not present

## 2019-06-07 DIAGNOSIS — G4700 Insomnia, unspecified: Secondary | ICD-10-CM | POA: Diagnosis not present

## 2019-06-11 DIAGNOSIS — N390 Urinary tract infection, site not specified: Secondary | ICD-10-CM | POA: Diagnosis not present

## 2019-06-19 DIAGNOSIS — E11621 Type 2 diabetes mellitus with foot ulcer: Secondary | ICD-10-CM | POA: Diagnosis not present

## 2019-06-19 DIAGNOSIS — B351 Tinea unguium: Secondary | ICD-10-CM | POA: Diagnosis not present

## 2019-06-19 DIAGNOSIS — L89621 Pressure ulcer of left heel, stage 1: Secondary | ICD-10-CM | POA: Diagnosis not present

## 2019-06-27 DIAGNOSIS — N390 Urinary tract infection, site not specified: Secondary | ICD-10-CM | POA: Diagnosis not present

## 2019-06-30 DIAGNOSIS — K219 Gastro-esophageal reflux disease without esophagitis: Secondary | ICD-10-CM | POA: Diagnosis not present

## 2019-06-30 DIAGNOSIS — I69318 Other symptoms and signs involving cognitive functions following cerebral infarction: Secondary | ICD-10-CM | POA: Diagnosis not present

## 2019-06-30 DIAGNOSIS — Z9181 History of falling: Secondary | ICD-10-CM | POA: Diagnosis not present

## 2019-06-30 DIAGNOSIS — Z7901 Long term (current) use of anticoagulants: Secondary | ICD-10-CM | POA: Diagnosis not present

## 2019-06-30 DIAGNOSIS — H9190 Unspecified hearing loss, unspecified ear: Secondary | ICD-10-CM | POA: Diagnosis not present

## 2019-06-30 DIAGNOSIS — I10 Essential (primary) hypertension: Secondary | ICD-10-CM | POA: Diagnosis not present

## 2019-06-30 DIAGNOSIS — Z794 Long term (current) use of insulin: Secondary | ICD-10-CM | POA: Diagnosis not present

## 2019-06-30 DIAGNOSIS — G4700 Insomnia, unspecified: Secondary | ICD-10-CM | POA: Diagnosis not present

## 2019-06-30 DIAGNOSIS — M76829 Posterior tibial tendinitis, unspecified leg: Secondary | ICD-10-CM | POA: Diagnosis not present

## 2019-06-30 DIAGNOSIS — Z7902 Long term (current) use of antithrombotics/antiplatelets: Secondary | ICD-10-CM | POA: Diagnosis not present

## 2019-06-30 DIAGNOSIS — E119 Type 2 diabetes mellitus without complications: Secondary | ICD-10-CM | POA: Diagnosis not present

## 2019-06-30 DIAGNOSIS — M722 Plantar fascial fibromatosis: Secondary | ICD-10-CM | POA: Diagnosis not present

## 2019-06-30 DIAGNOSIS — Z7951 Long term (current) use of inhaled steroids: Secondary | ICD-10-CM | POA: Diagnosis not present

## 2019-06-30 DIAGNOSIS — I2510 Atherosclerotic heart disease of native coronary artery without angina pectoris: Secondary | ICD-10-CM | POA: Diagnosis not present

## 2019-06-30 DIAGNOSIS — E7800 Pure hypercholesterolemia, unspecified: Secondary | ICD-10-CM | POA: Diagnosis not present

## 2019-06-30 DIAGNOSIS — L89622 Pressure ulcer of left heel, stage 2: Secondary | ICD-10-CM | POA: Diagnosis not present

## 2019-07-02 DIAGNOSIS — Z7901 Long term (current) use of anticoagulants: Secondary | ICD-10-CM | POA: Diagnosis not present

## 2019-07-02 DIAGNOSIS — G4700 Insomnia, unspecified: Secondary | ICD-10-CM | POA: Diagnosis not present

## 2019-07-02 DIAGNOSIS — I69318 Other symptoms and signs involving cognitive functions following cerebral infarction: Secondary | ICD-10-CM | POA: Diagnosis not present

## 2019-07-02 DIAGNOSIS — E7800 Pure hypercholesterolemia, unspecified: Secondary | ICD-10-CM | POA: Diagnosis not present

## 2019-07-02 DIAGNOSIS — H9190 Unspecified hearing loss, unspecified ear: Secondary | ICD-10-CM | POA: Diagnosis not present

## 2019-07-02 DIAGNOSIS — I10 Essential (primary) hypertension: Secondary | ICD-10-CM | POA: Diagnosis not present

## 2019-07-02 DIAGNOSIS — Z9181 History of falling: Secondary | ICD-10-CM | POA: Diagnosis not present

## 2019-07-02 DIAGNOSIS — E119 Type 2 diabetes mellitus without complications: Secondary | ICD-10-CM | POA: Diagnosis not present

## 2019-07-02 DIAGNOSIS — M722 Plantar fascial fibromatosis: Secondary | ICD-10-CM | POA: Diagnosis not present

## 2019-07-02 DIAGNOSIS — I2510 Atherosclerotic heart disease of native coronary artery without angina pectoris: Secondary | ICD-10-CM | POA: Diagnosis not present

## 2019-07-02 DIAGNOSIS — Z7902 Long term (current) use of antithrombotics/antiplatelets: Secondary | ICD-10-CM | POA: Diagnosis not present

## 2019-07-02 DIAGNOSIS — M76829 Posterior tibial tendinitis, unspecified leg: Secondary | ICD-10-CM | POA: Diagnosis not present

## 2019-07-02 DIAGNOSIS — Z7951 Long term (current) use of inhaled steroids: Secondary | ICD-10-CM | POA: Diagnosis not present

## 2019-07-02 DIAGNOSIS — K219 Gastro-esophageal reflux disease without esophagitis: Secondary | ICD-10-CM | POA: Diagnosis not present

## 2019-07-02 DIAGNOSIS — L89622 Pressure ulcer of left heel, stage 2: Secondary | ICD-10-CM | POA: Diagnosis not present

## 2019-07-02 DIAGNOSIS — Z794 Long term (current) use of insulin: Secondary | ICD-10-CM | POA: Diagnosis not present

## 2019-07-04 DIAGNOSIS — L89622 Pressure ulcer of left heel, stage 2: Secondary | ICD-10-CM | POA: Diagnosis not present

## 2019-07-05 DIAGNOSIS — I2510 Atherosclerotic heart disease of native coronary artery without angina pectoris: Secondary | ICD-10-CM | POA: Diagnosis not present

## 2019-07-05 DIAGNOSIS — Z7901 Long term (current) use of anticoagulants: Secondary | ICD-10-CM | POA: Diagnosis not present

## 2019-07-05 DIAGNOSIS — G4700 Insomnia, unspecified: Secondary | ICD-10-CM | POA: Diagnosis not present

## 2019-07-05 DIAGNOSIS — I10 Essential (primary) hypertension: Secondary | ICD-10-CM | POA: Diagnosis not present

## 2019-07-05 DIAGNOSIS — E119 Type 2 diabetes mellitus without complications: Secondary | ICD-10-CM | POA: Diagnosis not present

## 2019-07-05 DIAGNOSIS — M76829 Posterior tibial tendinitis, unspecified leg: Secondary | ICD-10-CM | POA: Diagnosis not present

## 2019-07-05 DIAGNOSIS — K219 Gastro-esophageal reflux disease without esophagitis: Secondary | ICD-10-CM | POA: Diagnosis not present

## 2019-07-05 DIAGNOSIS — Z7951 Long term (current) use of inhaled steroids: Secondary | ICD-10-CM | POA: Diagnosis not present

## 2019-07-05 DIAGNOSIS — Z7902 Long term (current) use of antithrombotics/antiplatelets: Secondary | ICD-10-CM | POA: Diagnosis not present

## 2019-07-05 DIAGNOSIS — H9190 Unspecified hearing loss, unspecified ear: Secondary | ICD-10-CM | POA: Diagnosis not present

## 2019-07-05 DIAGNOSIS — M722 Plantar fascial fibromatosis: Secondary | ICD-10-CM | POA: Diagnosis not present

## 2019-07-05 DIAGNOSIS — Z9181 History of falling: Secondary | ICD-10-CM | POA: Diagnosis not present

## 2019-07-05 DIAGNOSIS — E7800 Pure hypercholesterolemia, unspecified: Secondary | ICD-10-CM | POA: Diagnosis not present

## 2019-07-05 DIAGNOSIS — I69318 Other symptoms and signs involving cognitive functions following cerebral infarction: Secondary | ICD-10-CM | POA: Diagnosis not present

## 2019-07-05 DIAGNOSIS — Z794 Long term (current) use of insulin: Secondary | ICD-10-CM | POA: Diagnosis not present

## 2019-07-05 DIAGNOSIS — L89622 Pressure ulcer of left heel, stage 2: Secondary | ICD-10-CM | POA: Diagnosis not present

## 2019-07-09 DIAGNOSIS — Z7902 Long term (current) use of antithrombotics/antiplatelets: Secondary | ICD-10-CM | POA: Diagnosis not present

## 2019-07-09 DIAGNOSIS — E119 Type 2 diabetes mellitus without complications: Secondary | ICD-10-CM | POA: Diagnosis not present

## 2019-07-09 DIAGNOSIS — E7800 Pure hypercholesterolemia, unspecified: Secondary | ICD-10-CM | POA: Diagnosis not present

## 2019-07-09 DIAGNOSIS — L89622 Pressure ulcer of left heel, stage 2: Secondary | ICD-10-CM | POA: Diagnosis not present

## 2019-07-09 DIAGNOSIS — Z9181 History of falling: Secondary | ICD-10-CM | POA: Diagnosis not present

## 2019-07-09 DIAGNOSIS — Z794 Long term (current) use of insulin: Secondary | ICD-10-CM | POA: Diagnosis not present

## 2019-07-09 DIAGNOSIS — K219 Gastro-esophageal reflux disease without esophagitis: Secondary | ICD-10-CM | POA: Diagnosis not present

## 2019-07-09 DIAGNOSIS — H9190 Unspecified hearing loss, unspecified ear: Secondary | ICD-10-CM | POA: Diagnosis not present

## 2019-07-09 DIAGNOSIS — I69318 Other symptoms and signs involving cognitive functions following cerebral infarction: Secondary | ICD-10-CM | POA: Diagnosis not present

## 2019-07-09 DIAGNOSIS — Z7901 Long term (current) use of anticoagulants: Secondary | ICD-10-CM | POA: Diagnosis not present

## 2019-07-09 DIAGNOSIS — I2510 Atherosclerotic heart disease of native coronary artery without angina pectoris: Secondary | ICD-10-CM | POA: Diagnosis not present

## 2019-07-09 DIAGNOSIS — Z7951 Long term (current) use of inhaled steroids: Secondary | ICD-10-CM | POA: Diagnosis not present

## 2019-07-09 DIAGNOSIS — M722 Plantar fascial fibromatosis: Secondary | ICD-10-CM | POA: Diagnosis not present

## 2019-07-09 DIAGNOSIS — M76829 Posterior tibial tendinitis, unspecified leg: Secondary | ICD-10-CM | POA: Diagnosis not present

## 2019-07-09 DIAGNOSIS — I10 Essential (primary) hypertension: Secondary | ICD-10-CM | POA: Diagnosis not present

## 2019-07-09 DIAGNOSIS — G4700 Insomnia, unspecified: Secondary | ICD-10-CM | POA: Diagnosis not present

## 2019-07-11 DIAGNOSIS — E119 Type 2 diabetes mellitus without complications: Secondary | ICD-10-CM | POA: Diagnosis not present

## 2019-07-11 DIAGNOSIS — Z9181 History of falling: Secondary | ICD-10-CM | POA: Diagnosis not present

## 2019-07-11 DIAGNOSIS — L89622 Pressure ulcer of left heel, stage 2: Secondary | ICD-10-CM | POA: Diagnosis not present

## 2019-07-11 DIAGNOSIS — I10 Essential (primary) hypertension: Secondary | ICD-10-CM | POA: Diagnosis not present

## 2019-07-11 DIAGNOSIS — K219 Gastro-esophageal reflux disease without esophagitis: Secondary | ICD-10-CM | POA: Diagnosis not present

## 2019-07-11 DIAGNOSIS — E7800 Pure hypercholesterolemia, unspecified: Secondary | ICD-10-CM | POA: Diagnosis not present

## 2019-07-11 DIAGNOSIS — Z7951 Long term (current) use of inhaled steroids: Secondary | ICD-10-CM | POA: Diagnosis not present

## 2019-07-11 DIAGNOSIS — Z7902 Long term (current) use of antithrombotics/antiplatelets: Secondary | ICD-10-CM | POA: Diagnosis not present

## 2019-07-11 DIAGNOSIS — Z794 Long term (current) use of insulin: Secondary | ICD-10-CM | POA: Diagnosis not present

## 2019-07-11 DIAGNOSIS — Z7901 Long term (current) use of anticoagulants: Secondary | ICD-10-CM | POA: Diagnosis not present

## 2019-07-11 DIAGNOSIS — G4700 Insomnia, unspecified: Secondary | ICD-10-CM | POA: Diagnosis not present

## 2019-07-11 DIAGNOSIS — H9190 Unspecified hearing loss, unspecified ear: Secondary | ICD-10-CM | POA: Diagnosis not present

## 2019-07-11 DIAGNOSIS — M76829 Posterior tibial tendinitis, unspecified leg: Secondary | ICD-10-CM | POA: Diagnosis not present

## 2019-07-11 DIAGNOSIS — I69318 Other symptoms and signs involving cognitive functions following cerebral infarction: Secondary | ICD-10-CM | POA: Diagnosis not present

## 2019-07-11 DIAGNOSIS — I2510 Atherosclerotic heart disease of native coronary artery without angina pectoris: Secondary | ICD-10-CM | POA: Diagnosis not present

## 2019-07-11 DIAGNOSIS — M722 Plantar fascial fibromatosis: Secondary | ICD-10-CM | POA: Diagnosis not present

## 2019-07-13 DIAGNOSIS — Z9181 History of falling: Secondary | ICD-10-CM | POA: Diagnosis not present

## 2019-07-13 DIAGNOSIS — L89622 Pressure ulcer of left heel, stage 2: Secondary | ICD-10-CM | POA: Diagnosis not present

## 2019-07-13 DIAGNOSIS — K219 Gastro-esophageal reflux disease without esophagitis: Secondary | ICD-10-CM | POA: Diagnosis not present

## 2019-07-13 DIAGNOSIS — M76829 Posterior tibial tendinitis, unspecified leg: Secondary | ICD-10-CM | POA: Diagnosis not present

## 2019-07-13 DIAGNOSIS — Z7901 Long term (current) use of anticoagulants: Secondary | ICD-10-CM | POA: Diagnosis not present

## 2019-07-13 DIAGNOSIS — Z794 Long term (current) use of insulin: Secondary | ICD-10-CM | POA: Diagnosis not present

## 2019-07-13 DIAGNOSIS — E119 Type 2 diabetes mellitus without complications: Secondary | ICD-10-CM | POA: Diagnosis not present

## 2019-07-13 DIAGNOSIS — Z7902 Long term (current) use of antithrombotics/antiplatelets: Secondary | ICD-10-CM | POA: Diagnosis not present

## 2019-07-13 DIAGNOSIS — I69318 Other symptoms and signs involving cognitive functions following cerebral infarction: Secondary | ICD-10-CM | POA: Diagnosis not present

## 2019-07-13 DIAGNOSIS — G4700 Insomnia, unspecified: Secondary | ICD-10-CM | POA: Diagnosis not present

## 2019-07-13 DIAGNOSIS — E7800 Pure hypercholesterolemia, unspecified: Secondary | ICD-10-CM | POA: Diagnosis not present

## 2019-07-13 DIAGNOSIS — H9190 Unspecified hearing loss, unspecified ear: Secondary | ICD-10-CM | POA: Diagnosis not present

## 2019-07-13 DIAGNOSIS — I2510 Atherosclerotic heart disease of native coronary artery without angina pectoris: Secondary | ICD-10-CM | POA: Diagnosis not present

## 2019-07-13 DIAGNOSIS — M722 Plantar fascial fibromatosis: Secondary | ICD-10-CM | POA: Diagnosis not present

## 2019-07-13 DIAGNOSIS — I10 Essential (primary) hypertension: Secondary | ICD-10-CM | POA: Diagnosis not present

## 2019-07-13 DIAGNOSIS — Z7951 Long term (current) use of inhaled steroids: Secondary | ICD-10-CM | POA: Diagnosis not present

## 2019-07-16 DIAGNOSIS — Z7951 Long term (current) use of inhaled steroids: Secondary | ICD-10-CM | POA: Diagnosis not present

## 2019-07-16 DIAGNOSIS — H9190 Unspecified hearing loss, unspecified ear: Secondary | ICD-10-CM | POA: Diagnosis not present

## 2019-07-16 DIAGNOSIS — E119 Type 2 diabetes mellitus without complications: Secondary | ICD-10-CM | POA: Diagnosis not present

## 2019-07-16 DIAGNOSIS — Z794 Long term (current) use of insulin: Secondary | ICD-10-CM | POA: Diagnosis not present

## 2019-07-16 DIAGNOSIS — G4700 Insomnia, unspecified: Secondary | ICD-10-CM | POA: Diagnosis not present

## 2019-07-16 DIAGNOSIS — I10 Essential (primary) hypertension: Secondary | ICD-10-CM | POA: Diagnosis not present

## 2019-07-16 DIAGNOSIS — Z7901 Long term (current) use of anticoagulants: Secondary | ICD-10-CM | POA: Diagnosis not present

## 2019-07-16 DIAGNOSIS — K219 Gastro-esophageal reflux disease without esophagitis: Secondary | ICD-10-CM | POA: Diagnosis not present

## 2019-07-16 DIAGNOSIS — I69318 Other symptoms and signs involving cognitive functions following cerebral infarction: Secondary | ICD-10-CM | POA: Diagnosis not present

## 2019-07-16 DIAGNOSIS — Z7902 Long term (current) use of antithrombotics/antiplatelets: Secondary | ICD-10-CM | POA: Diagnosis not present

## 2019-07-16 DIAGNOSIS — E7800 Pure hypercholesterolemia, unspecified: Secondary | ICD-10-CM | POA: Diagnosis not present

## 2019-07-16 DIAGNOSIS — I2510 Atherosclerotic heart disease of native coronary artery without angina pectoris: Secondary | ICD-10-CM | POA: Diagnosis not present

## 2019-07-16 DIAGNOSIS — M76829 Posterior tibial tendinitis, unspecified leg: Secondary | ICD-10-CM | POA: Diagnosis not present

## 2019-07-16 DIAGNOSIS — M722 Plantar fascial fibromatosis: Secondary | ICD-10-CM | POA: Diagnosis not present

## 2019-07-16 DIAGNOSIS — Z9181 History of falling: Secondary | ICD-10-CM | POA: Diagnosis not present

## 2019-07-16 DIAGNOSIS — L89622 Pressure ulcer of left heel, stage 2: Secondary | ICD-10-CM | POA: Diagnosis not present

## 2019-07-17 DIAGNOSIS — E119 Type 2 diabetes mellitus without complications: Secondary | ICD-10-CM | POA: Diagnosis not present

## 2019-07-17 DIAGNOSIS — L89622 Pressure ulcer of left heel, stage 2: Secondary | ICD-10-CM | POA: Diagnosis not present

## 2019-07-18 DIAGNOSIS — Z7902 Long term (current) use of antithrombotics/antiplatelets: Secondary | ICD-10-CM | POA: Diagnosis not present

## 2019-07-18 DIAGNOSIS — Z7951 Long term (current) use of inhaled steroids: Secondary | ICD-10-CM | POA: Diagnosis not present

## 2019-07-18 DIAGNOSIS — E119 Type 2 diabetes mellitus without complications: Secondary | ICD-10-CM | POA: Diagnosis not present

## 2019-07-18 DIAGNOSIS — Z7901 Long term (current) use of anticoagulants: Secondary | ICD-10-CM | POA: Diagnosis not present

## 2019-07-18 DIAGNOSIS — M76829 Posterior tibial tendinitis, unspecified leg: Secondary | ICD-10-CM | POA: Diagnosis not present

## 2019-07-18 DIAGNOSIS — G4700 Insomnia, unspecified: Secondary | ICD-10-CM | POA: Diagnosis not present

## 2019-07-18 DIAGNOSIS — M722 Plantar fascial fibromatosis: Secondary | ICD-10-CM | POA: Diagnosis not present

## 2019-07-18 DIAGNOSIS — I10 Essential (primary) hypertension: Secondary | ICD-10-CM | POA: Diagnosis not present

## 2019-07-18 DIAGNOSIS — L89622 Pressure ulcer of left heel, stage 2: Secondary | ICD-10-CM | POA: Diagnosis not present

## 2019-07-18 DIAGNOSIS — I2510 Atherosclerotic heart disease of native coronary artery without angina pectoris: Secondary | ICD-10-CM | POA: Diagnosis not present

## 2019-07-18 DIAGNOSIS — Z794 Long term (current) use of insulin: Secondary | ICD-10-CM | POA: Diagnosis not present

## 2019-07-18 DIAGNOSIS — Z9181 History of falling: Secondary | ICD-10-CM | POA: Diagnosis not present

## 2019-07-18 DIAGNOSIS — K219 Gastro-esophageal reflux disease without esophagitis: Secondary | ICD-10-CM | POA: Diagnosis not present

## 2019-07-18 DIAGNOSIS — E7800 Pure hypercholesterolemia, unspecified: Secondary | ICD-10-CM | POA: Diagnosis not present

## 2019-07-18 DIAGNOSIS — H9190 Unspecified hearing loss, unspecified ear: Secondary | ICD-10-CM | POA: Diagnosis not present

## 2019-07-18 DIAGNOSIS — I69318 Other symptoms and signs involving cognitive functions following cerebral infarction: Secondary | ICD-10-CM | POA: Diagnosis not present

## 2019-07-20 DIAGNOSIS — E119 Type 2 diabetes mellitus without complications: Secondary | ICD-10-CM | POA: Diagnosis not present

## 2019-07-20 DIAGNOSIS — I69318 Other symptoms and signs involving cognitive functions following cerebral infarction: Secondary | ICD-10-CM | POA: Diagnosis not present

## 2019-07-20 DIAGNOSIS — Z794 Long term (current) use of insulin: Secondary | ICD-10-CM | POA: Diagnosis not present

## 2019-07-20 DIAGNOSIS — Z7901 Long term (current) use of anticoagulants: Secondary | ICD-10-CM | POA: Diagnosis not present

## 2019-07-20 DIAGNOSIS — I10 Essential (primary) hypertension: Secondary | ICD-10-CM | POA: Diagnosis not present

## 2019-07-20 DIAGNOSIS — L89622 Pressure ulcer of left heel, stage 2: Secondary | ICD-10-CM | POA: Diagnosis not present

## 2019-07-20 DIAGNOSIS — E7800 Pure hypercholesterolemia, unspecified: Secondary | ICD-10-CM | POA: Diagnosis not present

## 2019-07-20 DIAGNOSIS — K219 Gastro-esophageal reflux disease without esophagitis: Secondary | ICD-10-CM | POA: Diagnosis not present

## 2019-07-20 DIAGNOSIS — M722 Plantar fascial fibromatosis: Secondary | ICD-10-CM | POA: Diagnosis not present

## 2019-07-20 DIAGNOSIS — G4700 Insomnia, unspecified: Secondary | ICD-10-CM | POA: Diagnosis not present

## 2019-07-20 DIAGNOSIS — H9190 Unspecified hearing loss, unspecified ear: Secondary | ICD-10-CM | POA: Diagnosis not present

## 2019-07-20 DIAGNOSIS — Z7902 Long term (current) use of antithrombotics/antiplatelets: Secondary | ICD-10-CM | POA: Diagnosis not present

## 2019-07-20 DIAGNOSIS — M76829 Posterior tibial tendinitis, unspecified leg: Secondary | ICD-10-CM | POA: Diagnosis not present

## 2019-07-20 DIAGNOSIS — I2510 Atherosclerotic heart disease of native coronary artery without angina pectoris: Secondary | ICD-10-CM | POA: Diagnosis not present

## 2019-07-20 DIAGNOSIS — Z9181 History of falling: Secondary | ICD-10-CM | POA: Diagnosis not present

## 2019-07-20 DIAGNOSIS — Z7951 Long term (current) use of inhaled steroids: Secondary | ICD-10-CM | POA: Diagnosis not present

## 2019-07-23 DIAGNOSIS — Z794 Long term (current) use of insulin: Secondary | ICD-10-CM | POA: Diagnosis not present

## 2019-07-23 DIAGNOSIS — E7800 Pure hypercholesterolemia, unspecified: Secondary | ICD-10-CM | POA: Diagnosis not present

## 2019-07-23 DIAGNOSIS — I69318 Other symptoms and signs involving cognitive functions following cerebral infarction: Secondary | ICD-10-CM | POA: Diagnosis not present

## 2019-07-23 DIAGNOSIS — Z9181 History of falling: Secondary | ICD-10-CM | POA: Diagnosis not present

## 2019-07-23 DIAGNOSIS — M722 Plantar fascial fibromatosis: Secondary | ICD-10-CM | POA: Diagnosis not present

## 2019-07-23 DIAGNOSIS — Z7902 Long term (current) use of antithrombotics/antiplatelets: Secondary | ICD-10-CM | POA: Diagnosis not present

## 2019-07-23 DIAGNOSIS — E119 Type 2 diabetes mellitus without complications: Secondary | ICD-10-CM | POA: Diagnosis not present

## 2019-07-23 DIAGNOSIS — I10 Essential (primary) hypertension: Secondary | ICD-10-CM | POA: Diagnosis not present

## 2019-07-23 DIAGNOSIS — K219 Gastro-esophageal reflux disease without esophagitis: Secondary | ICD-10-CM | POA: Diagnosis not present

## 2019-07-23 DIAGNOSIS — I2510 Atherosclerotic heart disease of native coronary artery without angina pectoris: Secondary | ICD-10-CM | POA: Diagnosis not present

## 2019-07-23 DIAGNOSIS — G4700 Insomnia, unspecified: Secondary | ICD-10-CM | POA: Diagnosis not present

## 2019-07-23 DIAGNOSIS — Z7901 Long term (current) use of anticoagulants: Secondary | ICD-10-CM | POA: Diagnosis not present

## 2019-07-23 DIAGNOSIS — L89622 Pressure ulcer of left heel, stage 2: Secondary | ICD-10-CM | POA: Diagnosis not present

## 2019-07-23 DIAGNOSIS — H9190 Unspecified hearing loss, unspecified ear: Secondary | ICD-10-CM | POA: Diagnosis not present

## 2019-07-23 DIAGNOSIS — Z7951 Long term (current) use of inhaled steroids: Secondary | ICD-10-CM | POA: Diagnosis not present

## 2019-07-23 DIAGNOSIS — M76829 Posterior tibial tendinitis, unspecified leg: Secondary | ICD-10-CM | POA: Diagnosis not present

## 2019-07-26 DIAGNOSIS — E119 Type 2 diabetes mellitus without complications: Secondary | ICD-10-CM | POA: Diagnosis not present

## 2019-07-26 DIAGNOSIS — Z7901 Long term (current) use of anticoagulants: Secondary | ICD-10-CM | POA: Diagnosis not present

## 2019-07-26 DIAGNOSIS — L89622 Pressure ulcer of left heel, stage 2: Secondary | ICD-10-CM | POA: Diagnosis not present

## 2019-07-26 DIAGNOSIS — I69318 Other symptoms and signs involving cognitive functions following cerebral infarction: Secondary | ICD-10-CM | POA: Diagnosis not present

## 2019-07-26 DIAGNOSIS — H9190 Unspecified hearing loss, unspecified ear: Secondary | ICD-10-CM | POA: Diagnosis not present

## 2019-07-26 DIAGNOSIS — M722 Plantar fascial fibromatosis: Secondary | ICD-10-CM | POA: Diagnosis not present

## 2019-07-26 DIAGNOSIS — I10 Essential (primary) hypertension: Secondary | ICD-10-CM | POA: Diagnosis not present

## 2019-07-26 DIAGNOSIS — Z9181 History of falling: Secondary | ICD-10-CM | POA: Diagnosis not present

## 2019-07-26 DIAGNOSIS — I2510 Atherosclerotic heart disease of native coronary artery without angina pectoris: Secondary | ICD-10-CM | POA: Diagnosis not present

## 2019-07-26 DIAGNOSIS — Z7902 Long term (current) use of antithrombotics/antiplatelets: Secondary | ICD-10-CM | POA: Diagnosis not present

## 2019-07-26 DIAGNOSIS — Z7951 Long term (current) use of inhaled steroids: Secondary | ICD-10-CM | POA: Diagnosis not present

## 2019-07-26 DIAGNOSIS — Z794 Long term (current) use of insulin: Secondary | ICD-10-CM | POA: Diagnosis not present

## 2019-07-26 DIAGNOSIS — G4700 Insomnia, unspecified: Secondary | ICD-10-CM | POA: Diagnosis not present

## 2019-07-26 DIAGNOSIS — K219 Gastro-esophageal reflux disease without esophagitis: Secondary | ICD-10-CM | POA: Diagnosis not present

## 2019-07-26 DIAGNOSIS — M76829 Posterior tibial tendinitis, unspecified leg: Secondary | ICD-10-CM | POA: Diagnosis not present

## 2019-07-26 DIAGNOSIS — E7800 Pure hypercholesterolemia, unspecified: Secondary | ICD-10-CM | POA: Diagnosis not present

## 2019-07-28 DIAGNOSIS — E7800 Pure hypercholesterolemia, unspecified: Secondary | ICD-10-CM | POA: Diagnosis not present

## 2019-07-28 DIAGNOSIS — L89622 Pressure ulcer of left heel, stage 2: Secondary | ICD-10-CM | POA: Diagnosis not present

## 2019-07-28 DIAGNOSIS — H9190 Unspecified hearing loss, unspecified ear: Secondary | ICD-10-CM | POA: Diagnosis not present

## 2019-07-28 DIAGNOSIS — Z9181 History of falling: Secondary | ICD-10-CM | POA: Diagnosis not present

## 2019-07-28 DIAGNOSIS — I2510 Atherosclerotic heart disease of native coronary artery without angina pectoris: Secondary | ICD-10-CM | POA: Diagnosis not present

## 2019-07-28 DIAGNOSIS — M722 Plantar fascial fibromatosis: Secondary | ICD-10-CM | POA: Diagnosis not present

## 2019-07-28 DIAGNOSIS — I69318 Other symptoms and signs involving cognitive functions following cerebral infarction: Secondary | ICD-10-CM | POA: Diagnosis not present

## 2019-07-28 DIAGNOSIS — M76829 Posterior tibial tendinitis, unspecified leg: Secondary | ICD-10-CM | POA: Diagnosis not present

## 2019-07-28 DIAGNOSIS — K219 Gastro-esophageal reflux disease without esophagitis: Secondary | ICD-10-CM | POA: Diagnosis not present

## 2019-07-28 DIAGNOSIS — G4700 Insomnia, unspecified: Secondary | ICD-10-CM | POA: Diagnosis not present

## 2019-07-28 DIAGNOSIS — Z7902 Long term (current) use of antithrombotics/antiplatelets: Secondary | ICD-10-CM | POA: Diagnosis not present

## 2019-07-28 DIAGNOSIS — Z7901 Long term (current) use of anticoagulants: Secondary | ICD-10-CM | POA: Diagnosis not present

## 2019-07-28 DIAGNOSIS — Z794 Long term (current) use of insulin: Secondary | ICD-10-CM | POA: Diagnosis not present

## 2019-07-28 DIAGNOSIS — E119 Type 2 diabetes mellitus without complications: Secondary | ICD-10-CM | POA: Diagnosis not present

## 2019-07-28 DIAGNOSIS — I10 Essential (primary) hypertension: Secondary | ICD-10-CM | POA: Diagnosis not present

## 2019-07-28 DIAGNOSIS — Z7951 Long term (current) use of inhaled steroids: Secondary | ICD-10-CM | POA: Diagnosis not present

## 2019-07-30 DIAGNOSIS — I10 Essential (primary) hypertension: Secondary | ICD-10-CM | POA: Diagnosis not present

## 2019-07-30 DIAGNOSIS — E7800 Pure hypercholesterolemia, unspecified: Secondary | ICD-10-CM | POA: Diagnosis not present

## 2019-07-30 DIAGNOSIS — I2510 Atherosclerotic heart disease of native coronary artery without angina pectoris: Secondary | ICD-10-CM | POA: Diagnosis not present

## 2019-07-30 DIAGNOSIS — Z794 Long term (current) use of insulin: Secondary | ICD-10-CM | POA: Diagnosis not present

## 2019-07-30 DIAGNOSIS — L89622 Pressure ulcer of left heel, stage 2: Secondary | ICD-10-CM | POA: Diagnosis not present

## 2019-07-30 DIAGNOSIS — Z7902 Long term (current) use of antithrombotics/antiplatelets: Secondary | ICD-10-CM | POA: Diagnosis not present

## 2019-07-30 DIAGNOSIS — Z7951 Long term (current) use of inhaled steroids: Secondary | ICD-10-CM | POA: Diagnosis not present

## 2019-07-30 DIAGNOSIS — Z9181 History of falling: Secondary | ICD-10-CM | POA: Diagnosis not present

## 2019-07-30 DIAGNOSIS — M722 Plantar fascial fibromatosis: Secondary | ICD-10-CM | POA: Diagnosis not present

## 2019-07-30 DIAGNOSIS — Z7901 Long term (current) use of anticoagulants: Secondary | ICD-10-CM | POA: Diagnosis not present

## 2019-07-30 DIAGNOSIS — M76829 Posterior tibial tendinitis, unspecified leg: Secondary | ICD-10-CM | POA: Diagnosis not present

## 2019-07-30 DIAGNOSIS — H9190 Unspecified hearing loss, unspecified ear: Secondary | ICD-10-CM | POA: Diagnosis not present

## 2019-07-30 DIAGNOSIS — K219 Gastro-esophageal reflux disease without esophagitis: Secondary | ICD-10-CM | POA: Diagnosis not present

## 2019-07-30 DIAGNOSIS — G4700 Insomnia, unspecified: Secondary | ICD-10-CM | POA: Diagnosis not present

## 2019-07-30 DIAGNOSIS — I69318 Other symptoms and signs involving cognitive functions following cerebral infarction: Secondary | ICD-10-CM | POA: Diagnosis not present

## 2019-07-30 DIAGNOSIS — E119 Type 2 diabetes mellitus without complications: Secondary | ICD-10-CM | POA: Diagnosis not present

## 2019-08-01 DIAGNOSIS — E7800 Pure hypercholesterolemia, unspecified: Secondary | ICD-10-CM | POA: Diagnosis not present

## 2019-08-01 DIAGNOSIS — M76829 Posterior tibial tendinitis, unspecified leg: Secondary | ICD-10-CM | POA: Diagnosis not present

## 2019-08-01 DIAGNOSIS — E119 Type 2 diabetes mellitus without complications: Secondary | ICD-10-CM | POA: Diagnosis not present

## 2019-08-01 DIAGNOSIS — I2510 Atherosclerotic heart disease of native coronary artery without angina pectoris: Secondary | ICD-10-CM | POA: Diagnosis not present

## 2019-08-01 DIAGNOSIS — Z9181 History of falling: Secondary | ICD-10-CM | POA: Diagnosis not present

## 2019-08-01 DIAGNOSIS — I69318 Other symptoms and signs involving cognitive functions following cerebral infarction: Secondary | ICD-10-CM | POA: Diagnosis not present

## 2019-08-01 DIAGNOSIS — M722 Plantar fascial fibromatosis: Secondary | ICD-10-CM | POA: Diagnosis not present

## 2019-08-01 DIAGNOSIS — G4700 Insomnia, unspecified: Secondary | ICD-10-CM | POA: Diagnosis not present

## 2019-08-01 DIAGNOSIS — H9190 Unspecified hearing loss, unspecified ear: Secondary | ICD-10-CM | POA: Diagnosis not present

## 2019-08-01 DIAGNOSIS — I10 Essential (primary) hypertension: Secondary | ICD-10-CM | POA: Diagnosis not present

## 2019-08-01 DIAGNOSIS — K219 Gastro-esophageal reflux disease without esophagitis: Secondary | ICD-10-CM | POA: Diagnosis not present

## 2019-08-01 DIAGNOSIS — Z7951 Long term (current) use of inhaled steroids: Secondary | ICD-10-CM | POA: Diagnosis not present

## 2019-08-01 DIAGNOSIS — Z794 Long term (current) use of insulin: Secondary | ICD-10-CM | POA: Diagnosis not present

## 2019-08-01 DIAGNOSIS — L89622 Pressure ulcer of left heel, stage 2: Secondary | ICD-10-CM | POA: Diagnosis not present

## 2019-08-01 DIAGNOSIS — Z7901 Long term (current) use of anticoagulants: Secondary | ICD-10-CM | POA: Diagnosis not present

## 2019-08-01 DIAGNOSIS — Z7902 Long term (current) use of antithrombotics/antiplatelets: Secondary | ICD-10-CM | POA: Diagnosis not present

## 2019-08-07 DIAGNOSIS — L89622 Pressure ulcer of left heel, stage 2: Secondary | ICD-10-CM | POA: Diagnosis not present

## 2019-08-07 DIAGNOSIS — E119 Type 2 diabetes mellitus without complications: Secondary | ICD-10-CM | POA: Diagnosis not present

## 2019-08-10 DIAGNOSIS — Z7902 Long term (current) use of antithrombotics/antiplatelets: Secondary | ICD-10-CM | POA: Diagnosis not present

## 2019-08-10 DIAGNOSIS — L89622 Pressure ulcer of left heel, stage 2: Secondary | ICD-10-CM | POA: Diagnosis not present

## 2019-08-10 DIAGNOSIS — I10 Essential (primary) hypertension: Secondary | ICD-10-CM | POA: Diagnosis not present

## 2019-08-10 DIAGNOSIS — E7800 Pure hypercholesterolemia, unspecified: Secondary | ICD-10-CM | POA: Diagnosis not present

## 2019-08-10 DIAGNOSIS — I69318 Other symptoms and signs involving cognitive functions following cerebral infarction: Secondary | ICD-10-CM | POA: Diagnosis not present

## 2019-08-10 DIAGNOSIS — Z7901 Long term (current) use of anticoagulants: Secondary | ICD-10-CM | POA: Diagnosis not present

## 2019-08-10 DIAGNOSIS — I2510 Atherosclerotic heart disease of native coronary artery without angina pectoris: Secondary | ICD-10-CM | POA: Diagnosis not present

## 2019-08-10 DIAGNOSIS — Z794 Long term (current) use of insulin: Secondary | ICD-10-CM | POA: Diagnosis not present

## 2019-08-10 DIAGNOSIS — H9190 Unspecified hearing loss, unspecified ear: Secondary | ICD-10-CM | POA: Diagnosis not present

## 2019-08-10 DIAGNOSIS — K219 Gastro-esophageal reflux disease without esophagitis: Secondary | ICD-10-CM | POA: Diagnosis not present

## 2019-08-10 DIAGNOSIS — E119 Type 2 diabetes mellitus without complications: Secondary | ICD-10-CM | POA: Diagnosis not present

## 2019-08-10 DIAGNOSIS — G4700 Insomnia, unspecified: Secondary | ICD-10-CM | POA: Diagnosis not present

## 2019-08-10 DIAGNOSIS — M722 Plantar fascial fibromatosis: Secondary | ICD-10-CM | POA: Diagnosis not present

## 2019-08-10 DIAGNOSIS — M76829 Posterior tibial tendinitis, unspecified leg: Secondary | ICD-10-CM | POA: Diagnosis not present

## 2019-08-10 DIAGNOSIS — Z7951 Long term (current) use of inhaled steroids: Secondary | ICD-10-CM | POA: Diagnosis not present

## 2019-08-10 DIAGNOSIS — Z9181 History of falling: Secondary | ICD-10-CM | POA: Diagnosis not present

## 2019-08-14 DIAGNOSIS — K219 Gastro-esophageal reflux disease without esophagitis: Secondary | ICD-10-CM | POA: Diagnosis not present

## 2019-08-14 DIAGNOSIS — H9190 Unspecified hearing loss, unspecified ear: Secondary | ICD-10-CM | POA: Diagnosis not present

## 2019-08-14 DIAGNOSIS — I10 Essential (primary) hypertension: Secondary | ICD-10-CM | POA: Diagnosis not present

## 2019-08-14 DIAGNOSIS — G4700 Insomnia, unspecified: Secondary | ICD-10-CM | POA: Diagnosis not present

## 2019-08-14 DIAGNOSIS — I69318 Other symptoms and signs involving cognitive functions following cerebral infarction: Secondary | ICD-10-CM | POA: Diagnosis not present

## 2019-08-14 DIAGNOSIS — M76829 Posterior tibial tendinitis, unspecified leg: Secondary | ICD-10-CM | POA: Diagnosis not present

## 2019-08-14 DIAGNOSIS — M722 Plantar fascial fibromatosis: Secondary | ICD-10-CM | POA: Diagnosis not present

## 2019-08-14 DIAGNOSIS — E7800 Pure hypercholesterolemia, unspecified: Secondary | ICD-10-CM | POA: Diagnosis not present

## 2019-08-14 DIAGNOSIS — L89622 Pressure ulcer of left heel, stage 2: Secondary | ICD-10-CM | POA: Diagnosis not present

## 2019-08-14 DIAGNOSIS — Z7901 Long term (current) use of anticoagulants: Secondary | ICD-10-CM | POA: Diagnosis not present

## 2019-08-14 DIAGNOSIS — Z794 Long term (current) use of insulin: Secondary | ICD-10-CM | POA: Diagnosis not present

## 2019-08-14 DIAGNOSIS — Z9181 History of falling: Secondary | ICD-10-CM | POA: Diagnosis not present

## 2019-08-14 DIAGNOSIS — Z7902 Long term (current) use of antithrombotics/antiplatelets: Secondary | ICD-10-CM | POA: Diagnosis not present

## 2019-08-14 DIAGNOSIS — I2510 Atherosclerotic heart disease of native coronary artery without angina pectoris: Secondary | ICD-10-CM | POA: Diagnosis not present

## 2019-08-14 DIAGNOSIS — Z7951 Long term (current) use of inhaled steroids: Secondary | ICD-10-CM | POA: Diagnosis not present

## 2019-08-14 DIAGNOSIS — E119 Type 2 diabetes mellitus without complications: Secondary | ICD-10-CM | POA: Diagnosis not present

## 2019-08-16 DIAGNOSIS — G4700 Insomnia, unspecified: Secondary | ICD-10-CM | POA: Diagnosis not present

## 2019-08-16 DIAGNOSIS — I10 Essential (primary) hypertension: Secondary | ICD-10-CM | POA: Diagnosis not present

## 2019-08-16 DIAGNOSIS — L89622 Pressure ulcer of left heel, stage 2: Secondary | ICD-10-CM | POA: Diagnosis not present

## 2019-08-16 DIAGNOSIS — M76829 Posterior tibial tendinitis, unspecified leg: Secondary | ICD-10-CM | POA: Diagnosis not present

## 2019-08-16 DIAGNOSIS — Z7901 Long term (current) use of anticoagulants: Secondary | ICD-10-CM | POA: Diagnosis not present

## 2019-08-16 DIAGNOSIS — Z7951 Long term (current) use of inhaled steroids: Secondary | ICD-10-CM | POA: Diagnosis not present

## 2019-08-16 DIAGNOSIS — K219 Gastro-esophageal reflux disease without esophagitis: Secondary | ICD-10-CM | POA: Diagnosis not present

## 2019-08-16 DIAGNOSIS — E119 Type 2 diabetes mellitus without complications: Secondary | ICD-10-CM | POA: Diagnosis not present

## 2019-08-16 DIAGNOSIS — H9190 Unspecified hearing loss, unspecified ear: Secondary | ICD-10-CM | POA: Diagnosis not present

## 2019-08-16 DIAGNOSIS — I69318 Other symptoms and signs involving cognitive functions following cerebral infarction: Secondary | ICD-10-CM | POA: Diagnosis not present

## 2019-08-16 DIAGNOSIS — Z794 Long term (current) use of insulin: Secondary | ICD-10-CM | POA: Diagnosis not present

## 2019-08-16 DIAGNOSIS — Z7902 Long term (current) use of antithrombotics/antiplatelets: Secondary | ICD-10-CM | POA: Diagnosis not present

## 2019-08-16 DIAGNOSIS — I2510 Atherosclerotic heart disease of native coronary artery without angina pectoris: Secondary | ICD-10-CM | POA: Diagnosis not present

## 2019-08-16 DIAGNOSIS — E7800 Pure hypercholesterolemia, unspecified: Secondary | ICD-10-CM | POA: Diagnosis not present

## 2019-08-16 DIAGNOSIS — M722 Plantar fascial fibromatosis: Secondary | ICD-10-CM | POA: Diagnosis not present

## 2019-08-16 DIAGNOSIS — Z9181 History of falling: Secondary | ICD-10-CM | POA: Diagnosis not present

## 2019-08-17 DIAGNOSIS — L89622 Pressure ulcer of left heel, stage 2: Secondary | ICD-10-CM | POA: Diagnosis not present

## 2019-08-21 DIAGNOSIS — E11621 Type 2 diabetes mellitus with foot ulcer: Secondary | ICD-10-CM | POA: Diagnosis not present

## 2019-08-21 DIAGNOSIS — L89602 Pressure ulcer of unspecified heel, stage 2: Secondary | ICD-10-CM | POA: Diagnosis not present

## 2019-08-22 DIAGNOSIS — E7800 Pure hypercholesterolemia, unspecified: Secondary | ICD-10-CM | POA: Diagnosis not present

## 2019-08-22 DIAGNOSIS — I10 Essential (primary) hypertension: Secondary | ICD-10-CM | POA: Diagnosis not present

## 2019-08-22 DIAGNOSIS — Z7951 Long term (current) use of inhaled steroids: Secondary | ICD-10-CM | POA: Diagnosis not present

## 2019-08-22 DIAGNOSIS — M722 Plantar fascial fibromatosis: Secondary | ICD-10-CM | POA: Diagnosis not present

## 2019-08-22 DIAGNOSIS — Z7902 Long term (current) use of antithrombotics/antiplatelets: Secondary | ICD-10-CM | POA: Diagnosis not present

## 2019-08-22 DIAGNOSIS — Z7901 Long term (current) use of anticoagulants: Secondary | ICD-10-CM | POA: Diagnosis not present

## 2019-08-22 DIAGNOSIS — M76829 Posterior tibial tendinitis, unspecified leg: Secondary | ICD-10-CM | POA: Diagnosis not present

## 2019-08-22 DIAGNOSIS — G4700 Insomnia, unspecified: Secondary | ICD-10-CM | POA: Diagnosis not present

## 2019-08-22 DIAGNOSIS — K219 Gastro-esophageal reflux disease without esophagitis: Secondary | ICD-10-CM | POA: Diagnosis not present

## 2019-08-22 DIAGNOSIS — Z794 Long term (current) use of insulin: Secondary | ICD-10-CM | POA: Diagnosis not present

## 2019-08-22 DIAGNOSIS — Z9181 History of falling: Secondary | ICD-10-CM | POA: Diagnosis not present

## 2019-08-22 DIAGNOSIS — I69318 Other symptoms and signs involving cognitive functions following cerebral infarction: Secondary | ICD-10-CM | POA: Diagnosis not present

## 2019-08-22 DIAGNOSIS — H9190 Unspecified hearing loss, unspecified ear: Secondary | ICD-10-CM | POA: Diagnosis not present

## 2019-08-22 DIAGNOSIS — L89622 Pressure ulcer of left heel, stage 2: Secondary | ICD-10-CM | POA: Diagnosis not present

## 2019-08-22 DIAGNOSIS — I2510 Atherosclerotic heart disease of native coronary artery without angina pectoris: Secondary | ICD-10-CM | POA: Diagnosis not present

## 2019-08-22 DIAGNOSIS — E119 Type 2 diabetes mellitus without complications: Secondary | ICD-10-CM | POA: Diagnosis not present

## 2019-08-27 DIAGNOSIS — I10 Essential (primary) hypertension: Secondary | ICD-10-CM | POA: Diagnosis not present

## 2019-08-27 DIAGNOSIS — Z794 Long term (current) use of insulin: Secondary | ICD-10-CM | POA: Diagnosis not present

## 2019-08-27 DIAGNOSIS — Z7902 Long term (current) use of antithrombotics/antiplatelets: Secondary | ICD-10-CM | POA: Diagnosis not present

## 2019-08-27 DIAGNOSIS — I69318 Other symptoms and signs involving cognitive functions following cerebral infarction: Secondary | ICD-10-CM | POA: Diagnosis not present

## 2019-08-27 DIAGNOSIS — K219 Gastro-esophageal reflux disease without esophagitis: Secondary | ICD-10-CM | POA: Diagnosis not present

## 2019-08-27 DIAGNOSIS — Z9181 History of falling: Secondary | ICD-10-CM | POA: Diagnosis not present

## 2019-08-27 DIAGNOSIS — L89622 Pressure ulcer of left heel, stage 2: Secondary | ICD-10-CM | POA: Diagnosis not present

## 2019-08-27 DIAGNOSIS — M722 Plantar fascial fibromatosis: Secondary | ICD-10-CM | POA: Diagnosis not present

## 2019-08-27 DIAGNOSIS — I2510 Atherosclerotic heart disease of native coronary artery without angina pectoris: Secondary | ICD-10-CM | POA: Diagnosis not present

## 2019-08-27 DIAGNOSIS — Z7901 Long term (current) use of anticoagulants: Secondary | ICD-10-CM | POA: Diagnosis not present

## 2019-08-27 DIAGNOSIS — G4700 Insomnia, unspecified: Secondary | ICD-10-CM | POA: Diagnosis not present

## 2019-08-27 DIAGNOSIS — E119 Type 2 diabetes mellitus without complications: Secondary | ICD-10-CM | POA: Diagnosis not present

## 2019-08-27 DIAGNOSIS — H9190 Unspecified hearing loss, unspecified ear: Secondary | ICD-10-CM | POA: Diagnosis not present

## 2019-08-27 DIAGNOSIS — E7800 Pure hypercholesterolemia, unspecified: Secondary | ICD-10-CM | POA: Diagnosis not present

## 2019-08-27 DIAGNOSIS — Z7951 Long term (current) use of inhaled steroids: Secondary | ICD-10-CM | POA: Diagnosis not present

## 2019-08-27 DIAGNOSIS — M76829 Posterior tibial tendinitis, unspecified leg: Secondary | ICD-10-CM | POA: Diagnosis not present

## 2019-08-30 DIAGNOSIS — E7800 Pure hypercholesterolemia, unspecified: Secondary | ICD-10-CM | POA: Diagnosis not present

## 2019-08-30 DIAGNOSIS — I10 Essential (primary) hypertension: Secondary | ICD-10-CM | POA: Diagnosis not present

## 2019-08-30 DIAGNOSIS — K219 Gastro-esophageal reflux disease without esophagitis: Secondary | ICD-10-CM | POA: Diagnosis not present

## 2019-08-30 DIAGNOSIS — H9190 Unspecified hearing loss, unspecified ear: Secondary | ICD-10-CM | POA: Diagnosis not present

## 2019-08-30 DIAGNOSIS — I2510 Atherosclerotic heart disease of native coronary artery without angina pectoris: Secondary | ICD-10-CM | POA: Diagnosis not present

## 2019-08-30 DIAGNOSIS — Z7951 Long term (current) use of inhaled steroids: Secondary | ICD-10-CM | POA: Diagnosis not present

## 2019-08-30 DIAGNOSIS — Z7902 Long term (current) use of antithrombotics/antiplatelets: Secondary | ICD-10-CM | POA: Diagnosis not present

## 2019-08-30 DIAGNOSIS — M76829 Posterior tibial tendinitis, unspecified leg: Secondary | ICD-10-CM | POA: Diagnosis not present

## 2019-08-30 DIAGNOSIS — L89622 Pressure ulcer of left heel, stage 2: Secondary | ICD-10-CM | POA: Diagnosis not present

## 2019-08-30 DIAGNOSIS — M722 Plantar fascial fibromatosis: Secondary | ICD-10-CM | POA: Diagnosis not present

## 2019-08-30 DIAGNOSIS — E119 Type 2 diabetes mellitus without complications: Secondary | ICD-10-CM | POA: Diagnosis not present

## 2019-08-30 DIAGNOSIS — Z9181 History of falling: Secondary | ICD-10-CM | POA: Diagnosis not present

## 2019-08-30 DIAGNOSIS — Z794 Long term (current) use of insulin: Secondary | ICD-10-CM | POA: Diagnosis not present

## 2019-08-30 DIAGNOSIS — Z7901 Long term (current) use of anticoagulants: Secondary | ICD-10-CM | POA: Diagnosis not present

## 2019-08-30 DIAGNOSIS — G4700 Insomnia, unspecified: Secondary | ICD-10-CM | POA: Diagnosis not present

## 2019-08-30 DIAGNOSIS — I69318 Other symptoms and signs involving cognitive functions following cerebral infarction: Secondary | ICD-10-CM | POA: Diagnosis not present

## 2019-09-03 DIAGNOSIS — G4700 Insomnia, unspecified: Secondary | ICD-10-CM | POA: Diagnosis not present

## 2019-09-03 DIAGNOSIS — H9190 Unspecified hearing loss, unspecified ear: Secondary | ICD-10-CM | POA: Diagnosis not present

## 2019-09-03 DIAGNOSIS — I2510 Atherosclerotic heart disease of native coronary artery without angina pectoris: Secondary | ICD-10-CM | POA: Diagnosis not present

## 2019-09-03 DIAGNOSIS — M76829 Posterior tibial tendinitis, unspecified leg: Secondary | ICD-10-CM | POA: Diagnosis not present

## 2019-09-03 DIAGNOSIS — Z794 Long term (current) use of insulin: Secondary | ICD-10-CM | POA: Diagnosis not present

## 2019-09-03 DIAGNOSIS — Z7901 Long term (current) use of anticoagulants: Secondary | ICD-10-CM | POA: Diagnosis not present

## 2019-09-03 DIAGNOSIS — E119 Type 2 diabetes mellitus without complications: Secondary | ICD-10-CM | POA: Diagnosis not present

## 2019-09-03 DIAGNOSIS — K219 Gastro-esophageal reflux disease without esophagitis: Secondary | ICD-10-CM | POA: Diagnosis not present

## 2019-09-03 DIAGNOSIS — Z7951 Long term (current) use of inhaled steroids: Secondary | ICD-10-CM | POA: Diagnosis not present

## 2019-09-03 DIAGNOSIS — Z9181 History of falling: Secondary | ICD-10-CM | POA: Diagnosis not present

## 2019-09-03 DIAGNOSIS — E7800 Pure hypercholesterolemia, unspecified: Secondary | ICD-10-CM | POA: Diagnosis not present

## 2019-09-03 DIAGNOSIS — Z7902 Long term (current) use of antithrombotics/antiplatelets: Secondary | ICD-10-CM | POA: Diagnosis not present

## 2019-09-03 DIAGNOSIS — M722 Plantar fascial fibromatosis: Secondary | ICD-10-CM | POA: Diagnosis not present

## 2019-09-03 DIAGNOSIS — L89622 Pressure ulcer of left heel, stage 2: Secondary | ICD-10-CM | POA: Diagnosis not present

## 2019-09-03 DIAGNOSIS — I10 Essential (primary) hypertension: Secondary | ICD-10-CM | POA: Diagnosis not present

## 2019-09-03 DIAGNOSIS — I69318 Other symptoms and signs involving cognitive functions following cerebral infarction: Secondary | ICD-10-CM | POA: Diagnosis not present

## 2019-09-06 DIAGNOSIS — M722 Plantar fascial fibromatosis: Secondary | ICD-10-CM | POA: Diagnosis not present

## 2019-09-06 DIAGNOSIS — Z9181 History of falling: Secondary | ICD-10-CM | POA: Diagnosis not present

## 2019-09-06 DIAGNOSIS — Z7901 Long term (current) use of anticoagulants: Secondary | ICD-10-CM | POA: Diagnosis not present

## 2019-09-06 DIAGNOSIS — M76829 Posterior tibial tendinitis, unspecified leg: Secondary | ICD-10-CM | POA: Diagnosis not present

## 2019-09-06 DIAGNOSIS — K219 Gastro-esophageal reflux disease without esophagitis: Secondary | ICD-10-CM | POA: Diagnosis not present

## 2019-09-06 DIAGNOSIS — I69318 Other symptoms and signs involving cognitive functions following cerebral infarction: Secondary | ICD-10-CM | POA: Diagnosis not present

## 2019-09-06 DIAGNOSIS — Z7902 Long term (current) use of antithrombotics/antiplatelets: Secondary | ICD-10-CM | POA: Diagnosis not present

## 2019-09-06 DIAGNOSIS — E119 Type 2 diabetes mellitus without complications: Secondary | ICD-10-CM | POA: Diagnosis not present

## 2019-09-06 DIAGNOSIS — I10 Essential (primary) hypertension: Secondary | ICD-10-CM | POA: Diagnosis not present

## 2019-09-06 DIAGNOSIS — Z7951 Long term (current) use of inhaled steroids: Secondary | ICD-10-CM | POA: Diagnosis not present

## 2019-09-06 DIAGNOSIS — I2510 Atherosclerotic heart disease of native coronary artery without angina pectoris: Secondary | ICD-10-CM | POA: Diagnosis not present

## 2019-09-06 DIAGNOSIS — E7800 Pure hypercholesterolemia, unspecified: Secondary | ICD-10-CM | POA: Diagnosis not present

## 2019-09-06 DIAGNOSIS — Z794 Long term (current) use of insulin: Secondary | ICD-10-CM | POA: Diagnosis not present

## 2019-09-06 DIAGNOSIS — G4700 Insomnia, unspecified: Secondary | ICD-10-CM | POA: Diagnosis not present

## 2019-09-06 DIAGNOSIS — L89622 Pressure ulcer of left heel, stage 2: Secondary | ICD-10-CM | POA: Diagnosis not present

## 2019-09-06 DIAGNOSIS — H9190 Unspecified hearing loss, unspecified ear: Secondary | ICD-10-CM | POA: Diagnosis not present

## 2019-09-10 DIAGNOSIS — I10 Essential (primary) hypertension: Secondary | ICD-10-CM | POA: Diagnosis not present

## 2019-09-10 DIAGNOSIS — I69318 Other symptoms and signs involving cognitive functions following cerebral infarction: Secondary | ICD-10-CM | POA: Diagnosis not present

## 2019-09-10 DIAGNOSIS — Z9181 History of falling: Secondary | ICD-10-CM | POA: Diagnosis not present

## 2019-09-10 DIAGNOSIS — Z794 Long term (current) use of insulin: Secondary | ICD-10-CM | POA: Diagnosis not present

## 2019-09-10 DIAGNOSIS — Z7902 Long term (current) use of antithrombotics/antiplatelets: Secondary | ICD-10-CM | POA: Diagnosis not present

## 2019-09-10 DIAGNOSIS — I2510 Atherosclerotic heart disease of native coronary artery without angina pectoris: Secondary | ICD-10-CM | POA: Diagnosis not present

## 2019-09-10 DIAGNOSIS — E7800 Pure hypercholesterolemia, unspecified: Secondary | ICD-10-CM | POA: Diagnosis not present

## 2019-09-10 DIAGNOSIS — M76829 Posterior tibial tendinitis, unspecified leg: Secondary | ICD-10-CM | POA: Diagnosis not present

## 2019-09-10 DIAGNOSIS — L89622 Pressure ulcer of left heel, stage 2: Secondary | ICD-10-CM | POA: Diagnosis not present

## 2019-09-10 DIAGNOSIS — M722 Plantar fascial fibromatosis: Secondary | ICD-10-CM | POA: Diagnosis not present

## 2019-09-10 DIAGNOSIS — H9190 Unspecified hearing loss, unspecified ear: Secondary | ICD-10-CM | POA: Diagnosis not present

## 2019-09-10 DIAGNOSIS — Z7951 Long term (current) use of inhaled steroids: Secondary | ICD-10-CM | POA: Diagnosis not present

## 2019-09-10 DIAGNOSIS — E119 Type 2 diabetes mellitus without complications: Secondary | ICD-10-CM | POA: Diagnosis not present

## 2019-09-10 DIAGNOSIS — K219 Gastro-esophageal reflux disease without esophagitis: Secondary | ICD-10-CM | POA: Diagnosis not present

## 2019-09-10 DIAGNOSIS — Z7901 Long term (current) use of anticoagulants: Secondary | ICD-10-CM | POA: Diagnosis not present

## 2019-09-10 DIAGNOSIS — G4700 Insomnia, unspecified: Secondary | ICD-10-CM | POA: Diagnosis not present

## 2019-09-14 DIAGNOSIS — Z9181 History of falling: Secondary | ICD-10-CM | POA: Diagnosis not present

## 2019-09-14 DIAGNOSIS — Z7901 Long term (current) use of anticoagulants: Secondary | ICD-10-CM | POA: Diagnosis not present

## 2019-09-14 DIAGNOSIS — L89622 Pressure ulcer of left heel, stage 2: Secondary | ICD-10-CM | POA: Diagnosis not present

## 2019-09-14 DIAGNOSIS — E7800 Pure hypercholesterolemia, unspecified: Secondary | ICD-10-CM | POA: Diagnosis not present

## 2019-09-14 DIAGNOSIS — I69318 Other symptoms and signs involving cognitive functions following cerebral infarction: Secondary | ICD-10-CM | POA: Diagnosis not present

## 2019-09-14 DIAGNOSIS — K219 Gastro-esophageal reflux disease without esophagitis: Secondary | ICD-10-CM | POA: Diagnosis not present

## 2019-09-14 DIAGNOSIS — M722 Plantar fascial fibromatosis: Secondary | ICD-10-CM | POA: Diagnosis not present

## 2019-09-14 DIAGNOSIS — H9190 Unspecified hearing loss, unspecified ear: Secondary | ICD-10-CM | POA: Diagnosis not present

## 2019-09-14 DIAGNOSIS — Z7902 Long term (current) use of antithrombotics/antiplatelets: Secondary | ICD-10-CM | POA: Diagnosis not present

## 2019-09-14 DIAGNOSIS — I10 Essential (primary) hypertension: Secondary | ICD-10-CM | POA: Diagnosis not present

## 2019-09-14 DIAGNOSIS — Z7951 Long term (current) use of inhaled steroids: Secondary | ICD-10-CM | POA: Diagnosis not present

## 2019-09-14 DIAGNOSIS — M76829 Posterior tibial tendinitis, unspecified leg: Secondary | ICD-10-CM | POA: Diagnosis not present

## 2019-09-14 DIAGNOSIS — E119 Type 2 diabetes mellitus without complications: Secondary | ICD-10-CM | POA: Diagnosis not present

## 2019-09-14 DIAGNOSIS — Z794 Long term (current) use of insulin: Secondary | ICD-10-CM | POA: Diagnosis not present

## 2019-09-14 DIAGNOSIS — G4700 Insomnia, unspecified: Secondary | ICD-10-CM | POA: Diagnosis not present

## 2019-09-14 DIAGNOSIS — I2510 Atherosclerotic heart disease of native coronary artery without angina pectoris: Secondary | ICD-10-CM | POA: Diagnosis not present

## 2019-09-19 DIAGNOSIS — L89622 Pressure ulcer of left heel, stage 2: Secondary | ICD-10-CM | POA: Diagnosis not present

## 2019-09-19 DIAGNOSIS — G4700 Insomnia, unspecified: Secondary | ICD-10-CM | POA: Diagnosis not present

## 2019-09-19 DIAGNOSIS — I2510 Atherosclerotic heart disease of native coronary artery without angina pectoris: Secondary | ICD-10-CM | POA: Diagnosis not present

## 2019-09-19 DIAGNOSIS — K219 Gastro-esophageal reflux disease without esophagitis: Secondary | ICD-10-CM | POA: Diagnosis not present

## 2019-09-19 DIAGNOSIS — Z794 Long term (current) use of insulin: Secondary | ICD-10-CM | POA: Diagnosis not present

## 2019-09-19 DIAGNOSIS — E7800 Pure hypercholesterolemia, unspecified: Secondary | ICD-10-CM | POA: Diagnosis not present

## 2019-09-19 DIAGNOSIS — I69318 Other symptoms and signs involving cognitive functions following cerebral infarction: Secondary | ICD-10-CM | POA: Diagnosis not present

## 2019-09-19 DIAGNOSIS — H9190 Unspecified hearing loss, unspecified ear: Secondary | ICD-10-CM | POA: Diagnosis not present

## 2019-09-19 DIAGNOSIS — Z9181 History of falling: Secondary | ICD-10-CM | POA: Diagnosis not present

## 2019-09-19 DIAGNOSIS — M76829 Posterior tibial tendinitis, unspecified leg: Secondary | ICD-10-CM | POA: Diagnosis not present

## 2019-09-19 DIAGNOSIS — Z7951 Long term (current) use of inhaled steroids: Secondary | ICD-10-CM | POA: Diagnosis not present

## 2019-09-19 DIAGNOSIS — M722 Plantar fascial fibromatosis: Secondary | ICD-10-CM | POA: Diagnosis not present

## 2019-09-19 DIAGNOSIS — I10 Essential (primary) hypertension: Secondary | ICD-10-CM | POA: Diagnosis not present

## 2019-09-19 DIAGNOSIS — Z7902 Long term (current) use of antithrombotics/antiplatelets: Secondary | ICD-10-CM | POA: Diagnosis not present

## 2019-09-19 DIAGNOSIS — E119 Type 2 diabetes mellitus without complications: Secondary | ICD-10-CM | POA: Diagnosis not present

## 2019-09-19 DIAGNOSIS — Z7901 Long term (current) use of anticoagulants: Secondary | ICD-10-CM | POA: Diagnosis not present

## 2019-09-20 DIAGNOSIS — L89602 Pressure ulcer of unspecified heel, stage 2: Secondary | ICD-10-CM | POA: Diagnosis not present

## 2019-09-20 DIAGNOSIS — E11621 Type 2 diabetes mellitus with foot ulcer: Secondary | ICD-10-CM | POA: Diagnosis not present

## 2019-09-24 DIAGNOSIS — I69318 Other symptoms and signs involving cognitive functions following cerebral infarction: Secondary | ICD-10-CM | POA: Diagnosis not present

## 2019-09-24 DIAGNOSIS — I10 Essential (primary) hypertension: Secondary | ICD-10-CM | POA: Diagnosis not present

## 2019-09-24 DIAGNOSIS — Z7951 Long term (current) use of inhaled steroids: Secondary | ICD-10-CM | POA: Diagnosis not present

## 2019-09-24 DIAGNOSIS — M722 Plantar fascial fibromatosis: Secondary | ICD-10-CM | POA: Diagnosis not present

## 2019-09-24 DIAGNOSIS — I2510 Atherosclerotic heart disease of native coronary artery without angina pectoris: Secondary | ICD-10-CM | POA: Diagnosis not present

## 2019-09-24 DIAGNOSIS — H9190 Unspecified hearing loss, unspecified ear: Secondary | ICD-10-CM | POA: Diagnosis not present

## 2019-09-24 DIAGNOSIS — Z9181 History of falling: Secondary | ICD-10-CM | POA: Diagnosis not present

## 2019-09-24 DIAGNOSIS — L89622 Pressure ulcer of left heel, stage 2: Secondary | ICD-10-CM | POA: Diagnosis not present

## 2019-09-24 DIAGNOSIS — Z7901 Long term (current) use of anticoagulants: Secondary | ICD-10-CM | POA: Diagnosis not present

## 2019-09-24 DIAGNOSIS — K219 Gastro-esophageal reflux disease without esophagitis: Secondary | ICD-10-CM | POA: Diagnosis not present

## 2019-09-24 DIAGNOSIS — M76829 Posterior tibial tendinitis, unspecified leg: Secondary | ICD-10-CM | POA: Diagnosis not present

## 2019-09-24 DIAGNOSIS — E7800 Pure hypercholesterolemia, unspecified: Secondary | ICD-10-CM | POA: Diagnosis not present

## 2019-09-24 DIAGNOSIS — G4700 Insomnia, unspecified: Secondary | ICD-10-CM | POA: Diagnosis not present

## 2019-09-24 DIAGNOSIS — E119 Type 2 diabetes mellitus without complications: Secondary | ICD-10-CM | POA: Diagnosis not present

## 2019-09-24 DIAGNOSIS — Z7902 Long term (current) use of antithrombotics/antiplatelets: Secondary | ICD-10-CM | POA: Diagnosis not present

## 2019-09-24 DIAGNOSIS — Z794 Long term (current) use of insulin: Secondary | ICD-10-CM | POA: Diagnosis not present

## 2019-11-05 DIAGNOSIS — U071 COVID-19: Secondary | ICD-10-CM | POA: Diagnosis not present

## 2019-11-13 DIAGNOSIS — U071 COVID-19: Secondary | ICD-10-CM | POA: Diagnosis not present

## 2019-11-14 DIAGNOSIS — T447X1A Poisoning by beta-adrenoreceptor antagonists, accidental (unintentional), initial encounter: Secondary | ICD-10-CM | POA: Diagnosis not present

## 2019-11-14 DIAGNOSIS — G8929 Other chronic pain: Secondary | ICD-10-CM | POA: Diagnosis not present

## 2019-11-14 DIAGNOSIS — R001 Bradycardia, unspecified: Secondary | ICD-10-CM | POA: Diagnosis not present

## 2019-11-14 DIAGNOSIS — U071 COVID-19: Secondary | ICD-10-CM | POA: Diagnosis not present

## 2019-11-14 DIAGNOSIS — Z743 Need for continuous supervision: Secondary | ICD-10-CM | POA: Diagnosis not present

## 2019-11-14 DIAGNOSIS — M5489 Other dorsalgia: Secondary | ICD-10-CM | POA: Diagnosis not present

## 2019-11-14 DIAGNOSIS — T50995A Adverse effect of other drugs, medicaments and biological substances, initial encounter: Secondary | ICD-10-CM | POA: Diagnosis not present

## 2019-11-14 DIAGNOSIS — R0902 Hypoxemia: Secondary | ICD-10-CM | POA: Diagnosis not present

## 2019-11-14 DIAGNOSIS — M545 Low back pain: Secondary | ICD-10-CM | POA: Diagnosis not present

## 2019-11-15 DIAGNOSIS — Z7401 Bed confinement status: Secondary | ICD-10-CM | POA: Diagnosis not present

## 2019-11-15 DIAGNOSIS — M2550 Pain in unspecified joint: Secondary | ICD-10-CM | POA: Diagnosis not present

## 2019-11-15 DIAGNOSIS — M5489 Other dorsalgia: Secondary | ICD-10-CM | POA: Diagnosis not present

## 2019-11-15 DIAGNOSIS — E1165 Type 2 diabetes mellitus with hyperglycemia: Secondary | ICD-10-CM | POA: Diagnosis not present

## 2019-11-21 DIAGNOSIS — S59902A Unspecified injury of left elbow, initial encounter: Secondary | ICD-10-CM | POA: Diagnosis not present

## 2019-11-21 DIAGNOSIS — R52 Pain, unspecified: Secondary | ICD-10-CM | POA: Diagnosis not present

## 2019-11-21 DIAGNOSIS — R279 Unspecified lack of coordination: Secondary | ICD-10-CM | POA: Diagnosis not present

## 2019-11-21 DIAGNOSIS — R410 Disorientation, unspecified: Secondary | ICD-10-CM | POA: Diagnosis not present

## 2019-11-21 DIAGNOSIS — Z743 Need for continuous supervision: Secondary | ICD-10-CM | POA: Diagnosis not present

## 2019-11-21 DIAGNOSIS — S8991XA Unspecified injury of right lower leg, initial encounter: Secondary | ICD-10-CM | POA: Diagnosis not present

## 2019-11-21 DIAGNOSIS — U071 COVID-19: Secondary | ICD-10-CM | POA: Diagnosis not present

## 2019-11-21 DIAGNOSIS — S51012A Laceration without foreign body of left elbow, initial encounter: Secondary | ICD-10-CM | POA: Diagnosis not present

## 2019-11-21 DIAGNOSIS — M25522 Pain in left elbow: Secondary | ICD-10-CM | POA: Diagnosis not present

## 2019-11-21 DIAGNOSIS — R0902 Hypoxemia: Secondary | ICD-10-CM | POA: Diagnosis not present

## 2019-11-22 DIAGNOSIS — R5383 Other fatigue: Secondary | ICD-10-CM | POA: Diagnosis not present

## 2019-11-22 DIAGNOSIS — Z743 Need for continuous supervision: Secondary | ICD-10-CM | POA: Diagnosis not present

## 2019-11-22 DIAGNOSIS — R0602 Shortness of breath: Secondary | ICD-10-CM | POA: Diagnosis not present

## 2019-11-22 DIAGNOSIS — I129 Hypertensive chronic kidney disease with stage 1 through stage 4 chronic kidney disease, or unspecified chronic kidney disease: Secondary | ICD-10-CM | POA: Diagnosis not present

## 2019-11-22 DIAGNOSIS — N1831 Chronic kidney disease, stage 3a: Secondary | ICD-10-CM | POA: Diagnosis not present

## 2019-11-22 DIAGNOSIS — R069 Unspecified abnormalities of breathing: Secondary | ICD-10-CM | POA: Diagnosis not present

## 2019-11-22 DIAGNOSIS — R52 Pain, unspecified: Secondary | ICD-10-CM | POA: Diagnosis not present

## 2019-11-22 DIAGNOSIS — U071 COVID-19: Secondary | ICD-10-CM | POA: Diagnosis not present

## 2019-11-22 DIAGNOSIS — R5381 Other malaise: Secondary | ICD-10-CM | POA: Diagnosis not present

## 2019-11-22 DIAGNOSIS — R0902 Hypoxemia: Secondary | ICD-10-CM | POA: Diagnosis not present

## 2019-11-23 DIAGNOSIS — R0902 Hypoxemia: Secondary | ICD-10-CM | POA: Diagnosis not present

## 2019-11-23 DIAGNOSIS — Z743 Need for continuous supervision: Secondary | ICD-10-CM | POA: Diagnosis not present

## 2019-11-23 DIAGNOSIS — U071 COVID-19: Secondary | ICD-10-CM | POA: Diagnosis not present

## 2019-11-23 DIAGNOSIS — R279 Unspecified lack of coordination: Secondary | ICD-10-CM | POA: Diagnosis not present

## 2019-11-23 DIAGNOSIS — R52 Pain, unspecified: Secondary | ICD-10-CM | POA: Diagnosis not present

## 2019-11-25 DIAGNOSIS — M79631 Pain in right forearm: Secondary | ICD-10-CM | POA: Diagnosis not present

## 2019-11-25 DIAGNOSIS — M79601 Pain in right arm: Secondary | ICD-10-CM | POA: Diagnosis not present

## 2019-11-25 DIAGNOSIS — M545 Low back pain: Secondary | ICD-10-CM | POA: Diagnosis not present

## 2019-11-25 DIAGNOSIS — M25521 Pain in right elbow: Secondary | ICD-10-CM | POA: Diagnosis not present

## 2019-11-25 DIAGNOSIS — M25531 Pain in right wrist: Secondary | ICD-10-CM | POA: Diagnosis not present

## 2019-11-28 DIAGNOSIS — E7849 Other hyperlipidemia: Secondary | ICD-10-CM | POA: Diagnosis not present

## 2019-11-28 DIAGNOSIS — Z79899 Other long term (current) drug therapy: Secondary | ICD-10-CM | POA: Diagnosis not present

## 2019-11-30 DIAGNOSIS — E119 Type 2 diabetes mellitus without complications: Secondary | ICD-10-CM | POA: Diagnosis not present

## 2019-11-30 DIAGNOSIS — R519 Headache, unspecified: Secondary | ICD-10-CM | POA: Diagnosis not present

## 2019-11-30 DIAGNOSIS — M545 Low back pain: Secondary | ICD-10-CM | POA: Diagnosis not present

## 2019-11-30 DIAGNOSIS — G8911 Acute pain due to trauma: Secondary | ICD-10-CM | POA: Diagnosis not present

## 2019-11-30 DIAGNOSIS — R296 Repeated falls: Secondary | ICD-10-CM | POA: Diagnosis not present

## 2019-11-30 DIAGNOSIS — M25511 Pain in right shoulder: Secondary | ICD-10-CM | POA: Diagnosis not present

## 2019-11-30 DIAGNOSIS — G8929 Other chronic pain: Secondary | ICD-10-CM | POA: Diagnosis not present

## 2019-11-30 DIAGNOSIS — R05 Cough: Secondary | ICD-10-CM | POA: Diagnosis not present

## 2019-11-30 DIAGNOSIS — E785 Hyperlipidemia, unspecified: Secondary | ICD-10-CM | POA: Diagnosis not present

## 2019-11-30 DIAGNOSIS — I10 Essential (primary) hypertension: Secondary | ICD-10-CM | POA: Diagnosis not present

## 2019-11-30 DIAGNOSIS — I2510 Atherosclerotic heart disease of native coronary artery without angina pectoris: Secondary | ICD-10-CM | POA: Diagnosis not present

## 2019-11-30 DIAGNOSIS — M25531 Pain in right wrist: Secondary | ICD-10-CM | POA: Diagnosis not present

## 2019-12-04 DIAGNOSIS — I10 Essential (primary) hypertension: Secondary | ICD-10-CM | POA: Diagnosis not present

## 2019-12-04 DIAGNOSIS — M545 Low back pain: Secondary | ICD-10-CM | POA: Diagnosis not present

## 2019-12-04 DIAGNOSIS — M25531 Pain in right wrist: Secondary | ICD-10-CM | POA: Diagnosis not present

## 2019-12-04 DIAGNOSIS — E119 Type 2 diabetes mellitus without complications: Secondary | ICD-10-CM | POA: Diagnosis not present

## 2019-12-04 DIAGNOSIS — E785 Hyperlipidemia, unspecified: Secondary | ICD-10-CM | POA: Diagnosis not present

## 2019-12-04 DIAGNOSIS — M25511 Pain in right shoulder: Secondary | ICD-10-CM | POA: Diagnosis not present

## 2019-12-04 DIAGNOSIS — G8911 Acute pain due to trauma: Secondary | ICD-10-CM | POA: Diagnosis not present

## 2019-12-04 DIAGNOSIS — I2510 Atherosclerotic heart disease of native coronary artery without angina pectoris: Secondary | ICD-10-CM | POA: Diagnosis not present

## 2019-12-04 DIAGNOSIS — G8929 Other chronic pain: Secondary | ICD-10-CM | POA: Diagnosis not present

## 2019-12-06 DIAGNOSIS — R296 Repeated falls: Secondary | ICD-10-CM | POA: Diagnosis not present

## 2019-12-06 DIAGNOSIS — I10 Essential (primary) hypertension: Secondary | ICD-10-CM | POA: Diagnosis not present

## 2019-12-06 DIAGNOSIS — T148XXA Other injury of unspecified body region, initial encounter: Secondary | ICD-10-CM | POA: Diagnosis not present

## 2019-12-06 DIAGNOSIS — R413 Other amnesia: Secondary | ICD-10-CM | POA: Diagnosis not present

## 2019-12-11 DIAGNOSIS — I10 Essential (primary) hypertension: Secondary | ICD-10-CM | POA: Diagnosis not present

## 2019-12-11 DIAGNOSIS — M545 Low back pain: Secondary | ICD-10-CM | POA: Diagnosis not present

## 2019-12-11 DIAGNOSIS — I2510 Atherosclerotic heart disease of native coronary artery without angina pectoris: Secondary | ICD-10-CM | POA: Diagnosis not present

## 2019-12-11 DIAGNOSIS — E119 Type 2 diabetes mellitus without complications: Secondary | ICD-10-CM | POA: Diagnosis not present

## 2019-12-11 DIAGNOSIS — G8911 Acute pain due to trauma: Secondary | ICD-10-CM | POA: Diagnosis not present

## 2019-12-11 DIAGNOSIS — M25511 Pain in right shoulder: Secondary | ICD-10-CM | POA: Diagnosis not present

## 2019-12-11 DIAGNOSIS — E785 Hyperlipidemia, unspecified: Secondary | ICD-10-CM | POA: Diagnosis not present

## 2019-12-11 DIAGNOSIS — G8929 Other chronic pain: Secondary | ICD-10-CM | POA: Diagnosis not present

## 2019-12-11 DIAGNOSIS — M25531 Pain in right wrist: Secondary | ICD-10-CM | POA: Diagnosis not present

## 2019-12-13 DIAGNOSIS — G8911 Acute pain due to trauma: Secondary | ICD-10-CM | POA: Diagnosis not present

## 2019-12-13 DIAGNOSIS — E785 Hyperlipidemia, unspecified: Secondary | ICD-10-CM | POA: Diagnosis not present

## 2019-12-13 DIAGNOSIS — I10 Essential (primary) hypertension: Secondary | ICD-10-CM | POA: Diagnosis not present

## 2019-12-13 DIAGNOSIS — M545 Low back pain: Secondary | ICD-10-CM | POA: Diagnosis not present

## 2019-12-13 DIAGNOSIS — E119 Type 2 diabetes mellitus without complications: Secondary | ICD-10-CM | POA: Diagnosis not present

## 2019-12-13 DIAGNOSIS — I2510 Atherosclerotic heart disease of native coronary artery without angina pectoris: Secondary | ICD-10-CM | POA: Diagnosis not present

## 2019-12-13 DIAGNOSIS — M25511 Pain in right shoulder: Secondary | ICD-10-CM | POA: Diagnosis not present

## 2019-12-13 DIAGNOSIS — M25531 Pain in right wrist: Secondary | ICD-10-CM | POA: Diagnosis not present

## 2019-12-13 DIAGNOSIS — G8929 Other chronic pain: Secondary | ICD-10-CM | POA: Diagnosis not present

## 2019-12-18 DIAGNOSIS — E785 Hyperlipidemia, unspecified: Secondary | ICD-10-CM | POA: Diagnosis not present

## 2019-12-18 DIAGNOSIS — E119 Type 2 diabetes mellitus without complications: Secondary | ICD-10-CM | POA: Diagnosis not present

## 2019-12-18 DIAGNOSIS — G8911 Acute pain due to trauma: Secondary | ICD-10-CM | POA: Diagnosis not present

## 2019-12-18 DIAGNOSIS — G8929 Other chronic pain: Secondary | ICD-10-CM | POA: Diagnosis not present

## 2019-12-18 DIAGNOSIS — I10 Essential (primary) hypertension: Secondary | ICD-10-CM | POA: Diagnosis not present

## 2019-12-18 DIAGNOSIS — I2510 Atherosclerotic heart disease of native coronary artery without angina pectoris: Secondary | ICD-10-CM | POA: Diagnosis not present

## 2019-12-18 DIAGNOSIS — M545 Low back pain: Secondary | ICD-10-CM | POA: Diagnosis not present

## 2019-12-18 DIAGNOSIS — M25511 Pain in right shoulder: Secondary | ICD-10-CM | POA: Diagnosis not present

## 2019-12-18 DIAGNOSIS — M25531 Pain in right wrist: Secondary | ICD-10-CM | POA: Diagnosis not present

## 2019-12-27 DIAGNOSIS — E785 Hyperlipidemia, unspecified: Secondary | ICD-10-CM | POA: Diagnosis not present

## 2019-12-27 DIAGNOSIS — G8911 Acute pain due to trauma: Secondary | ICD-10-CM | POA: Diagnosis not present

## 2019-12-27 DIAGNOSIS — I10 Essential (primary) hypertension: Secondary | ICD-10-CM | POA: Diagnosis not present

## 2019-12-27 DIAGNOSIS — M25531 Pain in right wrist: Secondary | ICD-10-CM | POA: Diagnosis not present

## 2019-12-27 DIAGNOSIS — I2510 Atherosclerotic heart disease of native coronary artery without angina pectoris: Secondary | ICD-10-CM | POA: Diagnosis not present

## 2019-12-27 DIAGNOSIS — F419 Anxiety disorder, unspecified: Secondary | ICD-10-CM | POA: Diagnosis not present

## 2019-12-27 DIAGNOSIS — F0391 Unspecified dementia with behavioral disturbance: Secondary | ICD-10-CM | POA: Diagnosis not present

## 2019-12-27 DIAGNOSIS — M25511 Pain in right shoulder: Secondary | ICD-10-CM | POA: Diagnosis not present

## 2019-12-27 DIAGNOSIS — M545 Low back pain: Secondary | ICD-10-CM | POA: Diagnosis not present

## 2019-12-27 DIAGNOSIS — G8929 Other chronic pain: Secondary | ICD-10-CM | POA: Diagnosis not present

## 2019-12-27 DIAGNOSIS — E119 Type 2 diabetes mellitus without complications: Secondary | ICD-10-CM | POA: Diagnosis not present

## 2019-12-27 DIAGNOSIS — R451 Restlessness and agitation: Secondary | ICD-10-CM | POA: Diagnosis not present

## 2019-12-27 DIAGNOSIS — F329 Major depressive disorder, single episode, unspecified: Secondary | ICD-10-CM | POA: Diagnosis not present

## 2019-12-31 DIAGNOSIS — I2510 Atherosclerotic heart disease of native coronary artery without angina pectoris: Secondary | ICD-10-CM | POA: Diagnosis not present

## 2019-12-31 DIAGNOSIS — E785 Hyperlipidemia, unspecified: Secondary | ICD-10-CM | POA: Diagnosis not present

## 2019-12-31 DIAGNOSIS — G8929 Other chronic pain: Secondary | ICD-10-CM | POA: Diagnosis not present

## 2019-12-31 DIAGNOSIS — M25511 Pain in right shoulder: Secondary | ICD-10-CM | POA: Diagnosis not present

## 2019-12-31 DIAGNOSIS — E119 Type 2 diabetes mellitus without complications: Secondary | ICD-10-CM | POA: Diagnosis not present

## 2019-12-31 DIAGNOSIS — I10 Essential (primary) hypertension: Secondary | ICD-10-CM | POA: Diagnosis not present

## 2019-12-31 DIAGNOSIS — G8911 Acute pain due to trauma: Secondary | ICD-10-CM | POA: Diagnosis not present

## 2019-12-31 DIAGNOSIS — M25531 Pain in right wrist: Secondary | ICD-10-CM | POA: Diagnosis not present

## 2019-12-31 DIAGNOSIS — M545 Low back pain: Secondary | ICD-10-CM | POA: Diagnosis not present

## 2020-01-04 DIAGNOSIS — E119 Type 2 diabetes mellitus without complications: Secondary | ICD-10-CM | POA: Diagnosis not present

## 2020-01-04 DIAGNOSIS — M545 Low back pain: Secondary | ICD-10-CM | POA: Diagnosis not present

## 2020-01-04 DIAGNOSIS — M25511 Pain in right shoulder: Secondary | ICD-10-CM | POA: Diagnosis not present

## 2020-01-04 DIAGNOSIS — E785 Hyperlipidemia, unspecified: Secondary | ICD-10-CM | POA: Diagnosis not present

## 2020-01-04 DIAGNOSIS — M25531 Pain in right wrist: Secondary | ICD-10-CM | POA: Diagnosis not present

## 2020-01-04 DIAGNOSIS — I2510 Atherosclerotic heart disease of native coronary artery without angina pectoris: Secondary | ICD-10-CM | POA: Diagnosis not present

## 2020-01-04 DIAGNOSIS — I10 Essential (primary) hypertension: Secondary | ICD-10-CM | POA: Diagnosis not present

## 2020-01-04 DIAGNOSIS — G8911 Acute pain due to trauma: Secondary | ICD-10-CM | POA: Diagnosis not present

## 2020-01-04 DIAGNOSIS — G8929 Other chronic pain: Secondary | ICD-10-CM | POA: Diagnosis not present

## 2020-01-07 DIAGNOSIS — I10 Essential (primary) hypertension: Secondary | ICD-10-CM | POA: Diagnosis not present

## 2020-01-07 DIAGNOSIS — I2510 Atherosclerotic heart disease of native coronary artery without angina pectoris: Secondary | ICD-10-CM | POA: Diagnosis not present

## 2020-01-07 DIAGNOSIS — G8929 Other chronic pain: Secondary | ICD-10-CM | POA: Diagnosis not present

## 2020-01-07 DIAGNOSIS — E119 Type 2 diabetes mellitus without complications: Secondary | ICD-10-CM | POA: Diagnosis not present

## 2020-01-07 DIAGNOSIS — M25511 Pain in right shoulder: Secondary | ICD-10-CM | POA: Diagnosis not present

## 2020-01-07 DIAGNOSIS — M25531 Pain in right wrist: Secondary | ICD-10-CM | POA: Diagnosis not present

## 2020-01-07 DIAGNOSIS — M545 Low back pain: Secondary | ICD-10-CM | POA: Diagnosis not present

## 2020-01-07 DIAGNOSIS — E785 Hyperlipidemia, unspecified: Secondary | ICD-10-CM | POA: Diagnosis not present

## 2020-01-07 DIAGNOSIS — G8911 Acute pain due to trauma: Secondary | ICD-10-CM | POA: Diagnosis not present

## 2020-01-09 DIAGNOSIS — G8911 Acute pain due to trauma: Secondary | ICD-10-CM | POA: Diagnosis not present

## 2020-01-09 DIAGNOSIS — I10 Essential (primary) hypertension: Secondary | ICD-10-CM | POA: Diagnosis not present

## 2020-01-09 DIAGNOSIS — E785 Hyperlipidemia, unspecified: Secondary | ICD-10-CM | POA: Diagnosis not present

## 2020-01-09 DIAGNOSIS — I2510 Atherosclerotic heart disease of native coronary artery without angina pectoris: Secondary | ICD-10-CM | POA: Diagnosis not present

## 2020-01-09 DIAGNOSIS — M25511 Pain in right shoulder: Secondary | ICD-10-CM | POA: Diagnosis not present

## 2020-01-09 DIAGNOSIS — M25531 Pain in right wrist: Secondary | ICD-10-CM | POA: Diagnosis not present

## 2020-01-09 DIAGNOSIS — E119 Type 2 diabetes mellitus without complications: Secondary | ICD-10-CM | POA: Diagnosis not present

## 2020-01-09 DIAGNOSIS — G8929 Other chronic pain: Secondary | ICD-10-CM | POA: Diagnosis not present

## 2020-01-09 DIAGNOSIS — M545 Low back pain: Secondary | ICD-10-CM | POA: Diagnosis not present

## 2020-01-11 DIAGNOSIS — M545 Low back pain: Secondary | ICD-10-CM | POA: Diagnosis not present

## 2020-01-11 DIAGNOSIS — M25511 Pain in right shoulder: Secondary | ICD-10-CM | POA: Diagnosis not present

## 2020-01-11 DIAGNOSIS — M25531 Pain in right wrist: Secondary | ICD-10-CM | POA: Diagnosis not present

## 2020-01-11 DIAGNOSIS — E119 Type 2 diabetes mellitus without complications: Secondary | ICD-10-CM | POA: Diagnosis not present

## 2020-01-11 DIAGNOSIS — G8929 Other chronic pain: Secondary | ICD-10-CM | POA: Diagnosis not present

## 2020-01-11 DIAGNOSIS — E785 Hyperlipidemia, unspecified: Secondary | ICD-10-CM | POA: Diagnosis not present

## 2020-01-11 DIAGNOSIS — G8911 Acute pain due to trauma: Secondary | ICD-10-CM | POA: Diagnosis not present

## 2020-01-11 DIAGNOSIS — I10 Essential (primary) hypertension: Secondary | ICD-10-CM | POA: Diagnosis not present

## 2020-01-11 DIAGNOSIS — I2510 Atherosclerotic heart disease of native coronary artery without angina pectoris: Secondary | ICD-10-CM | POA: Diagnosis not present

## 2020-01-13 DIAGNOSIS — I10 Essential (primary) hypertension: Secondary | ICD-10-CM | POA: Diagnosis not present

## 2020-01-13 DIAGNOSIS — I2510 Atherosclerotic heart disease of native coronary artery without angina pectoris: Secondary | ICD-10-CM | POA: Diagnosis not present

## 2020-01-13 DIAGNOSIS — G8929 Other chronic pain: Secondary | ICD-10-CM | POA: Diagnosis not present

## 2020-01-13 DIAGNOSIS — E119 Type 2 diabetes mellitus without complications: Secondary | ICD-10-CM | POA: Diagnosis not present

## 2020-01-13 DIAGNOSIS — E785 Hyperlipidemia, unspecified: Secondary | ICD-10-CM | POA: Diagnosis not present

## 2020-01-13 DIAGNOSIS — M25531 Pain in right wrist: Secondary | ICD-10-CM | POA: Diagnosis not present

## 2020-01-13 DIAGNOSIS — M545 Low back pain: Secondary | ICD-10-CM | POA: Diagnosis not present

## 2020-01-13 DIAGNOSIS — G8911 Acute pain due to trauma: Secondary | ICD-10-CM | POA: Diagnosis not present

## 2020-01-13 DIAGNOSIS — M25511 Pain in right shoulder: Secondary | ICD-10-CM | POA: Diagnosis not present

## 2020-01-16 DIAGNOSIS — G8911 Acute pain due to trauma: Secondary | ICD-10-CM | POA: Diagnosis not present

## 2020-01-16 DIAGNOSIS — G8929 Other chronic pain: Secondary | ICD-10-CM | POA: Diagnosis not present

## 2020-01-16 DIAGNOSIS — I10 Essential (primary) hypertension: Secondary | ICD-10-CM | POA: Diagnosis not present

## 2020-01-16 DIAGNOSIS — I2510 Atherosclerotic heart disease of native coronary artery without angina pectoris: Secondary | ICD-10-CM | POA: Diagnosis not present

## 2020-01-16 DIAGNOSIS — E785 Hyperlipidemia, unspecified: Secondary | ICD-10-CM | POA: Diagnosis not present

## 2020-01-16 DIAGNOSIS — M25531 Pain in right wrist: Secondary | ICD-10-CM | POA: Diagnosis not present

## 2020-01-16 DIAGNOSIS — M25511 Pain in right shoulder: Secondary | ICD-10-CM | POA: Diagnosis not present

## 2020-01-16 DIAGNOSIS — E119 Type 2 diabetes mellitus without complications: Secondary | ICD-10-CM | POA: Diagnosis not present

## 2020-01-16 DIAGNOSIS — M545 Low back pain: Secondary | ICD-10-CM | POA: Diagnosis not present

## 2020-01-18 DIAGNOSIS — I2510 Atherosclerotic heart disease of native coronary artery without angina pectoris: Secondary | ICD-10-CM | POA: Diagnosis not present

## 2020-01-18 DIAGNOSIS — E119 Type 2 diabetes mellitus without complications: Secondary | ICD-10-CM | POA: Diagnosis not present

## 2020-01-18 DIAGNOSIS — G8911 Acute pain due to trauma: Secondary | ICD-10-CM | POA: Diagnosis not present

## 2020-01-18 DIAGNOSIS — M25531 Pain in right wrist: Secondary | ICD-10-CM | POA: Diagnosis not present

## 2020-01-18 DIAGNOSIS — M545 Low back pain: Secondary | ICD-10-CM | POA: Diagnosis not present

## 2020-01-18 DIAGNOSIS — G8929 Other chronic pain: Secondary | ICD-10-CM | POA: Diagnosis not present

## 2020-01-18 DIAGNOSIS — E785 Hyperlipidemia, unspecified: Secondary | ICD-10-CM | POA: Diagnosis not present

## 2020-01-18 DIAGNOSIS — I10 Essential (primary) hypertension: Secondary | ICD-10-CM | POA: Diagnosis not present

## 2020-01-18 DIAGNOSIS — M25511 Pain in right shoulder: Secondary | ICD-10-CM | POA: Diagnosis not present

## 2020-01-20 DIAGNOSIS — I2510 Atherosclerotic heart disease of native coronary artery without angina pectoris: Secondary | ICD-10-CM | POA: Diagnosis not present

## 2020-01-20 DIAGNOSIS — G8929 Other chronic pain: Secondary | ICD-10-CM | POA: Diagnosis not present

## 2020-01-20 DIAGNOSIS — M25531 Pain in right wrist: Secondary | ICD-10-CM | POA: Diagnosis not present

## 2020-01-20 DIAGNOSIS — I10 Essential (primary) hypertension: Secondary | ICD-10-CM | POA: Diagnosis not present

## 2020-01-20 DIAGNOSIS — G8911 Acute pain due to trauma: Secondary | ICD-10-CM | POA: Diagnosis not present

## 2020-01-20 DIAGNOSIS — E119 Type 2 diabetes mellitus without complications: Secondary | ICD-10-CM | POA: Diagnosis not present

## 2020-01-20 DIAGNOSIS — M545 Low back pain: Secondary | ICD-10-CM | POA: Diagnosis not present

## 2020-01-20 DIAGNOSIS — M25511 Pain in right shoulder: Secondary | ICD-10-CM | POA: Diagnosis not present

## 2020-01-20 DIAGNOSIS — E785 Hyperlipidemia, unspecified: Secondary | ICD-10-CM | POA: Diagnosis not present

## 2020-01-23 DIAGNOSIS — M25531 Pain in right wrist: Secondary | ICD-10-CM | POA: Diagnosis not present

## 2020-01-23 DIAGNOSIS — G8911 Acute pain due to trauma: Secondary | ICD-10-CM | POA: Diagnosis not present

## 2020-01-23 DIAGNOSIS — G8929 Other chronic pain: Secondary | ICD-10-CM | POA: Diagnosis not present

## 2020-01-23 DIAGNOSIS — M25511 Pain in right shoulder: Secondary | ICD-10-CM | POA: Diagnosis not present

## 2020-01-23 DIAGNOSIS — I10 Essential (primary) hypertension: Secondary | ICD-10-CM | POA: Diagnosis not present

## 2020-01-23 DIAGNOSIS — E119 Type 2 diabetes mellitus without complications: Secondary | ICD-10-CM | POA: Diagnosis not present

## 2020-01-23 DIAGNOSIS — M545 Low back pain: Secondary | ICD-10-CM | POA: Diagnosis not present

## 2020-01-23 DIAGNOSIS — E785 Hyperlipidemia, unspecified: Secondary | ICD-10-CM | POA: Diagnosis not present

## 2020-01-23 DIAGNOSIS — I2510 Atherosclerotic heart disease of native coronary artery without angina pectoris: Secondary | ICD-10-CM | POA: Diagnosis not present

## 2020-01-24 DIAGNOSIS — F419 Anxiety disorder, unspecified: Secondary | ICD-10-CM | POA: Diagnosis not present

## 2020-01-24 DIAGNOSIS — F329 Major depressive disorder, single episode, unspecified: Secondary | ICD-10-CM | POA: Diagnosis not present

## 2020-01-24 DIAGNOSIS — F0391 Unspecified dementia with behavioral disturbance: Secondary | ICD-10-CM | POA: Diagnosis not present

## 2020-01-25 DIAGNOSIS — E785 Hyperlipidemia, unspecified: Secondary | ICD-10-CM | POA: Diagnosis not present

## 2020-01-25 DIAGNOSIS — M25511 Pain in right shoulder: Secondary | ICD-10-CM | POA: Diagnosis not present

## 2020-01-25 DIAGNOSIS — I2510 Atherosclerotic heart disease of native coronary artery without angina pectoris: Secondary | ICD-10-CM | POA: Diagnosis not present

## 2020-01-25 DIAGNOSIS — G8929 Other chronic pain: Secondary | ICD-10-CM | POA: Diagnosis not present

## 2020-01-25 DIAGNOSIS — E119 Type 2 diabetes mellitus without complications: Secondary | ICD-10-CM | POA: Diagnosis not present

## 2020-01-25 DIAGNOSIS — M25531 Pain in right wrist: Secondary | ICD-10-CM | POA: Diagnosis not present

## 2020-01-25 DIAGNOSIS — G8911 Acute pain due to trauma: Secondary | ICD-10-CM | POA: Diagnosis not present

## 2020-01-25 DIAGNOSIS — M545 Low back pain: Secondary | ICD-10-CM | POA: Diagnosis not present

## 2020-01-25 DIAGNOSIS — I10 Essential (primary) hypertension: Secondary | ICD-10-CM | POA: Diagnosis not present

## 2020-01-28 DIAGNOSIS — M25511 Pain in right shoulder: Secondary | ICD-10-CM | POA: Diagnosis not present

## 2020-01-28 DIAGNOSIS — E785 Hyperlipidemia, unspecified: Secondary | ICD-10-CM | POA: Diagnosis not present

## 2020-01-28 DIAGNOSIS — M545 Low back pain: Secondary | ICD-10-CM | POA: Diagnosis not present

## 2020-01-28 DIAGNOSIS — I10 Essential (primary) hypertension: Secondary | ICD-10-CM | POA: Diagnosis not present

## 2020-01-28 DIAGNOSIS — I2510 Atherosclerotic heart disease of native coronary artery without angina pectoris: Secondary | ICD-10-CM | POA: Diagnosis not present

## 2020-01-28 DIAGNOSIS — G8929 Other chronic pain: Secondary | ICD-10-CM | POA: Diagnosis not present

## 2020-01-28 DIAGNOSIS — E119 Type 2 diabetes mellitus without complications: Secondary | ICD-10-CM | POA: Diagnosis not present

## 2020-01-28 DIAGNOSIS — M25531 Pain in right wrist: Secondary | ICD-10-CM | POA: Diagnosis not present

## 2020-01-28 DIAGNOSIS — G8911 Acute pain due to trauma: Secondary | ICD-10-CM | POA: Diagnosis not present

## 2020-01-29 DIAGNOSIS — L89510 Pressure ulcer of right ankle, unstageable: Secondary | ICD-10-CM | POA: Diagnosis not present

## 2020-01-30 DIAGNOSIS — G8929 Other chronic pain: Secondary | ICD-10-CM | POA: Diagnosis not present

## 2020-01-30 DIAGNOSIS — L89510 Pressure ulcer of right ankle, unstageable: Secondary | ICD-10-CM | POA: Diagnosis not present

## 2020-01-30 DIAGNOSIS — I2510 Atherosclerotic heart disease of native coronary artery without angina pectoris: Secondary | ICD-10-CM | POA: Diagnosis not present

## 2020-01-30 DIAGNOSIS — B351 Tinea unguium: Secondary | ICD-10-CM | POA: Diagnosis not present

## 2020-01-30 DIAGNOSIS — M25531 Pain in right wrist: Secondary | ICD-10-CM | POA: Diagnosis not present

## 2020-01-30 DIAGNOSIS — M25511 Pain in right shoulder: Secondary | ICD-10-CM | POA: Diagnosis not present

## 2020-01-30 DIAGNOSIS — G8911 Acute pain due to trauma: Secondary | ICD-10-CM | POA: Diagnosis not present

## 2020-01-30 DIAGNOSIS — M545 Low back pain: Secondary | ICD-10-CM | POA: Diagnosis not present

## 2020-01-30 DIAGNOSIS — E119 Type 2 diabetes mellitus without complications: Secondary | ICD-10-CM | POA: Diagnosis not present

## 2020-01-30 DIAGNOSIS — I10 Essential (primary) hypertension: Secondary | ICD-10-CM | POA: Diagnosis not present

## 2020-01-30 DIAGNOSIS — E1169 Type 2 diabetes mellitus with other specified complication: Secondary | ICD-10-CM | POA: Diagnosis not present

## 2020-02-01 DIAGNOSIS — I2510 Atherosclerotic heart disease of native coronary artery without angina pectoris: Secondary | ICD-10-CM | POA: Diagnosis not present

## 2020-02-01 DIAGNOSIS — M545 Low back pain: Secondary | ICD-10-CM | POA: Diagnosis not present

## 2020-02-01 DIAGNOSIS — G8911 Acute pain due to trauma: Secondary | ICD-10-CM | POA: Diagnosis not present

## 2020-02-01 DIAGNOSIS — M25511 Pain in right shoulder: Secondary | ICD-10-CM | POA: Diagnosis not present

## 2020-02-01 DIAGNOSIS — G8929 Other chronic pain: Secondary | ICD-10-CM | POA: Diagnosis not present

## 2020-02-01 DIAGNOSIS — M25531 Pain in right wrist: Secondary | ICD-10-CM | POA: Diagnosis not present

## 2020-02-01 DIAGNOSIS — I10 Essential (primary) hypertension: Secondary | ICD-10-CM | POA: Diagnosis not present

## 2020-02-01 DIAGNOSIS — E119 Type 2 diabetes mellitus without complications: Secondary | ICD-10-CM | POA: Diagnosis not present

## 2020-02-01 DIAGNOSIS — L89510 Pressure ulcer of right ankle, unstageable: Secondary | ICD-10-CM | POA: Diagnosis not present

## 2020-02-04 DIAGNOSIS — I2510 Atherosclerotic heart disease of native coronary artery without angina pectoris: Secondary | ICD-10-CM | POA: Diagnosis not present

## 2020-02-04 DIAGNOSIS — M25531 Pain in right wrist: Secondary | ICD-10-CM | POA: Diagnosis not present

## 2020-02-04 DIAGNOSIS — M25511 Pain in right shoulder: Secondary | ICD-10-CM | POA: Diagnosis not present

## 2020-02-04 DIAGNOSIS — L89510 Pressure ulcer of right ankle, unstageable: Secondary | ICD-10-CM | POA: Diagnosis not present

## 2020-02-04 DIAGNOSIS — M545 Low back pain: Secondary | ICD-10-CM | POA: Diagnosis not present

## 2020-02-04 DIAGNOSIS — G8911 Acute pain due to trauma: Secondary | ICD-10-CM | POA: Diagnosis not present

## 2020-02-04 DIAGNOSIS — G8929 Other chronic pain: Secondary | ICD-10-CM | POA: Diagnosis not present

## 2020-02-04 DIAGNOSIS — I10 Essential (primary) hypertension: Secondary | ICD-10-CM | POA: Diagnosis not present

## 2020-02-04 DIAGNOSIS — E119 Type 2 diabetes mellitus without complications: Secondary | ICD-10-CM | POA: Diagnosis not present

## 2020-02-05 DIAGNOSIS — G8911 Acute pain due to trauma: Secondary | ICD-10-CM | POA: Diagnosis not present

## 2020-02-05 DIAGNOSIS — M25531 Pain in right wrist: Secondary | ICD-10-CM | POA: Diagnosis not present

## 2020-02-05 DIAGNOSIS — I10 Essential (primary) hypertension: Secondary | ICD-10-CM | POA: Diagnosis not present

## 2020-02-05 DIAGNOSIS — I2510 Atherosclerotic heart disease of native coronary artery without angina pectoris: Secondary | ICD-10-CM | POA: Diagnosis not present

## 2020-02-05 DIAGNOSIS — E119 Type 2 diabetes mellitus without complications: Secondary | ICD-10-CM | POA: Diagnosis not present

## 2020-02-05 DIAGNOSIS — G8929 Other chronic pain: Secondary | ICD-10-CM | POA: Diagnosis not present

## 2020-02-05 DIAGNOSIS — M545 Low back pain: Secondary | ICD-10-CM | POA: Diagnosis not present

## 2020-02-05 DIAGNOSIS — L89510 Pressure ulcer of right ankle, unstageable: Secondary | ICD-10-CM | POA: Diagnosis not present

## 2020-02-05 DIAGNOSIS — M25511 Pain in right shoulder: Secondary | ICD-10-CM | POA: Diagnosis not present

## 2020-02-06 DIAGNOSIS — M545 Low back pain: Secondary | ICD-10-CM | POA: Diagnosis not present

## 2020-02-06 DIAGNOSIS — E119 Type 2 diabetes mellitus without complications: Secondary | ICD-10-CM | POA: Diagnosis not present

## 2020-02-06 DIAGNOSIS — G8929 Other chronic pain: Secondary | ICD-10-CM | POA: Diagnosis not present

## 2020-02-06 DIAGNOSIS — I10 Essential (primary) hypertension: Secondary | ICD-10-CM | POA: Diagnosis not present

## 2020-02-06 DIAGNOSIS — M25511 Pain in right shoulder: Secondary | ICD-10-CM | POA: Diagnosis not present

## 2020-02-06 DIAGNOSIS — M25531 Pain in right wrist: Secondary | ICD-10-CM | POA: Diagnosis not present

## 2020-02-06 DIAGNOSIS — I2510 Atherosclerotic heart disease of native coronary artery without angina pectoris: Secondary | ICD-10-CM | POA: Diagnosis not present

## 2020-02-06 DIAGNOSIS — L89510 Pressure ulcer of right ankle, unstageable: Secondary | ICD-10-CM | POA: Diagnosis not present

## 2020-02-06 DIAGNOSIS — G8911 Acute pain due to trauma: Secondary | ICD-10-CM | POA: Diagnosis not present

## 2020-02-07 DIAGNOSIS — I2510 Atherosclerotic heart disease of native coronary artery without angina pectoris: Secondary | ICD-10-CM | POA: Diagnosis not present

## 2020-02-07 DIAGNOSIS — M545 Low back pain: Secondary | ICD-10-CM | POA: Diagnosis not present

## 2020-02-07 DIAGNOSIS — I10 Essential (primary) hypertension: Secondary | ICD-10-CM | POA: Diagnosis not present

## 2020-02-07 DIAGNOSIS — G8911 Acute pain due to trauma: Secondary | ICD-10-CM | POA: Diagnosis not present

## 2020-02-07 DIAGNOSIS — G8929 Other chronic pain: Secondary | ICD-10-CM | POA: Diagnosis not present

## 2020-02-07 DIAGNOSIS — E119 Type 2 diabetes mellitus without complications: Secondary | ICD-10-CM | POA: Diagnosis not present

## 2020-02-07 DIAGNOSIS — M25511 Pain in right shoulder: Secondary | ICD-10-CM | POA: Diagnosis not present

## 2020-02-07 DIAGNOSIS — L89510 Pressure ulcer of right ankle, unstageable: Secondary | ICD-10-CM | POA: Diagnosis not present

## 2020-02-07 DIAGNOSIS — M25531 Pain in right wrist: Secondary | ICD-10-CM | POA: Diagnosis not present

## 2020-02-08 DIAGNOSIS — M25531 Pain in right wrist: Secondary | ICD-10-CM | POA: Diagnosis not present

## 2020-02-08 DIAGNOSIS — I10 Essential (primary) hypertension: Secondary | ICD-10-CM | POA: Diagnosis not present

## 2020-02-08 DIAGNOSIS — G8929 Other chronic pain: Secondary | ICD-10-CM | POA: Diagnosis not present

## 2020-02-08 DIAGNOSIS — G8911 Acute pain due to trauma: Secondary | ICD-10-CM | POA: Diagnosis not present

## 2020-02-08 DIAGNOSIS — I2510 Atherosclerotic heart disease of native coronary artery without angina pectoris: Secondary | ICD-10-CM | POA: Diagnosis not present

## 2020-02-08 DIAGNOSIS — M25511 Pain in right shoulder: Secondary | ICD-10-CM | POA: Diagnosis not present

## 2020-02-08 DIAGNOSIS — M545 Low back pain: Secondary | ICD-10-CM | POA: Diagnosis not present

## 2020-02-08 DIAGNOSIS — E119 Type 2 diabetes mellitus without complications: Secondary | ICD-10-CM | POA: Diagnosis not present

## 2020-02-08 DIAGNOSIS — L89510 Pressure ulcer of right ankle, unstageable: Secondary | ICD-10-CM | POA: Diagnosis not present

## 2020-02-11 DIAGNOSIS — M545 Low back pain: Secondary | ICD-10-CM | POA: Diagnosis not present

## 2020-02-11 DIAGNOSIS — M25511 Pain in right shoulder: Secondary | ICD-10-CM | POA: Diagnosis not present

## 2020-02-11 DIAGNOSIS — G8911 Acute pain due to trauma: Secondary | ICD-10-CM | POA: Diagnosis not present

## 2020-02-11 DIAGNOSIS — E119 Type 2 diabetes mellitus without complications: Secondary | ICD-10-CM | POA: Diagnosis not present

## 2020-02-11 DIAGNOSIS — I10 Essential (primary) hypertension: Secondary | ICD-10-CM | POA: Diagnosis not present

## 2020-02-11 DIAGNOSIS — I2510 Atherosclerotic heart disease of native coronary artery without angina pectoris: Secondary | ICD-10-CM | POA: Diagnosis not present

## 2020-02-11 DIAGNOSIS — G8929 Other chronic pain: Secondary | ICD-10-CM | POA: Diagnosis not present

## 2020-02-11 DIAGNOSIS — M25531 Pain in right wrist: Secondary | ICD-10-CM | POA: Diagnosis not present

## 2020-02-11 DIAGNOSIS — L89510 Pressure ulcer of right ankle, unstageable: Secondary | ICD-10-CM | POA: Diagnosis not present

## 2020-02-12 DIAGNOSIS — M25531 Pain in right wrist: Secondary | ICD-10-CM | POA: Diagnosis not present

## 2020-02-12 DIAGNOSIS — I10 Essential (primary) hypertension: Secondary | ICD-10-CM | POA: Diagnosis not present

## 2020-02-12 DIAGNOSIS — I2510 Atherosclerotic heart disease of native coronary artery without angina pectoris: Secondary | ICD-10-CM | POA: Diagnosis not present

## 2020-02-12 DIAGNOSIS — E119 Type 2 diabetes mellitus without complications: Secondary | ICD-10-CM | POA: Diagnosis not present

## 2020-02-12 DIAGNOSIS — M545 Low back pain: Secondary | ICD-10-CM | POA: Diagnosis not present

## 2020-02-12 DIAGNOSIS — L89510 Pressure ulcer of right ankle, unstageable: Secondary | ICD-10-CM | POA: Diagnosis not present

## 2020-02-12 DIAGNOSIS — G8929 Other chronic pain: Secondary | ICD-10-CM | POA: Diagnosis not present

## 2020-02-12 DIAGNOSIS — M25511 Pain in right shoulder: Secondary | ICD-10-CM | POA: Diagnosis not present

## 2020-02-12 DIAGNOSIS — G8911 Acute pain due to trauma: Secondary | ICD-10-CM | POA: Diagnosis not present

## 2020-02-13 DIAGNOSIS — L89510 Pressure ulcer of right ankle, unstageable: Secondary | ICD-10-CM | POA: Diagnosis not present

## 2020-02-13 DIAGNOSIS — I10 Essential (primary) hypertension: Secondary | ICD-10-CM | POA: Diagnosis not present

## 2020-02-13 DIAGNOSIS — M25511 Pain in right shoulder: Secondary | ICD-10-CM | POA: Diagnosis not present

## 2020-02-13 DIAGNOSIS — G8929 Other chronic pain: Secondary | ICD-10-CM | POA: Diagnosis not present

## 2020-02-13 DIAGNOSIS — M545 Low back pain: Secondary | ICD-10-CM | POA: Diagnosis not present

## 2020-02-13 DIAGNOSIS — M25531 Pain in right wrist: Secondary | ICD-10-CM | POA: Diagnosis not present

## 2020-02-13 DIAGNOSIS — I2510 Atherosclerotic heart disease of native coronary artery without angina pectoris: Secondary | ICD-10-CM | POA: Diagnosis not present

## 2020-02-13 DIAGNOSIS — E119 Type 2 diabetes mellitus without complications: Secondary | ICD-10-CM | POA: Diagnosis not present

## 2020-02-13 DIAGNOSIS — G8911 Acute pain due to trauma: Secondary | ICD-10-CM | POA: Diagnosis not present

## 2020-02-14 DIAGNOSIS — L89510 Pressure ulcer of right ankle, unstageable: Secondary | ICD-10-CM | POA: Diagnosis not present

## 2020-02-14 DIAGNOSIS — G8929 Other chronic pain: Secondary | ICD-10-CM | POA: Diagnosis not present

## 2020-02-14 DIAGNOSIS — M25511 Pain in right shoulder: Secondary | ICD-10-CM | POA: Diagnosis not present

## 2020-02-14 DIAGNOSIS — M545 Low back pain: Secondary | ICD-10-CM | POA: Diagnosis not present

## 2020-02-14 DIAGNOSIS — G8911 Acute pain due to trauma: Secondary | ICD-10-CM | POA: Diagnosis not present

## 2020-02-14 DIAGNOSIS — I2510 Atherosclerotic heart disease of native coronary artery without angina pectoris: Secondary | ICD-10-CM | POA: Diagnosis not present

## 2020-02-14 DIAGNOSIS — E119 Type 2 diabetes mellitus without complications: Secondary | ICD-10-CM | POA: Diagnosis not present

## 2020-02-14 DIAGNOSIS — I10 Essential (primary) hypertension: Secondary | ICD-10-CM | POA: Diagnosis not present

## 2020-02-14 DIAGNOSIS — M25531 Pain in right wrist: Secondary | ICD-10-CM | POA: Diagnosis not present

## 2020-02-15 DIAGNOSIS — M545 Low back pain: Secondary | ICD-10-CM | POA: Diagnosis not present

## 2020-02-15 DIAGNOSIS — G8911 Acute pain due to trauma: Secondary | ICD-10-CM | POA: Diagnosis not present

## 2020-02-15 DIAGNOSIS — M25531 Pain in right wrist: Secondary | ICD-10-CM | POA: Diagnosis not present

## 2020-02-15 DIAGNOSIS — I10 Essential (primary) hypertension: Secondary | ICD-10-CM | POA: Diagnosis not present

## 2020-02-15 DIAGNOSIS — L89510 Pressure ulcer of right ankle, unstageable: Secondary | ICD-10-CM | POA: Diagnosis not present

## 2020-02-15 DIAGNOSIS — I2510 Atherosclerotic heart disease of native coronary artery without angina pectoris: Secondary | ICD-10-CM | POA: Diagnosis not present

## 2020-02-15 DIAGNOSIS — M25511 Pain in right shoulder: Secondary | ICD-10-CM | POA: Diagnosis not present

## 2020-02-15 DIAGNOSIS — G8929 Other chronic pain: Secondary | ICD-10-CM | POA: Diagnosis not present

## 2020-02-15 DIAGNOSIS — E119 Type 2 diabetes mellitus without complications: Secondary | ICD-10-CM | POA: Diagnosis not present

## 2020-02-18 DIAGNOSIS — I10 Essential (primary) hypertension: Secondary | ICD-10-CM | POA: Diagnosis not present

## 2020-02-18 DIAGNOSIS — M25511 Pain in right shoulder: Secondary | ICD-10-CM | POA: Diagnosis not present

## 2020-02-18 DIAGNOSIS — I2510 Atherosclerotic heart disease of native coronary artery without angina pectoris: Secondary | ICD-10-CM | POA: Diagnosis not present

## 2020-02-18 DIAGNOSIS — L89510 Pressure ulcer of right ankle, unstageable: Secondary | ICD-10-CM | POA: Diagnosis not present

## 2020-02-18 DIAGNOSIS — G8911 Acute pain due to trauma: Secondary | ICD-10-CM | POA: Diagnosis not present

## 2020-02-18 DIAGNOSIS — G8929 Other chronic pain: Secondary | ICD-10-CM | POA: Diagnosis not present

## 2020-02-18 DIAGNOSIS — M545 Low back pain: Secondary | ICD-10-CM | POA: Diagnosis not present

## 2020-02-18 DIAGNOSIS — M25531 Pain in right wrist: Secondary | ICD-10-CM | POA: Diagnosis not present

## 2020-02-18 DIAGNOSIS — E119 Type 2 diabetes mellitus without complications: Secondary | ICD-10-CM | POA: Diagnosis not present

## 2020-02-20 DIAGNOSIS — M25511 Pain in right shoulder: Secondary | ICD-10-CM | POA: Diagnosis not present

## 2020-02-20 DIAGNOSIS — E119 Type 2 diabetes mellitus without complications: Secondary | ICD-10-CM | POA: Diagnosis not present

## 2020-02-20 DIAGNOSIS — L89510 Pressure ulcer of right ankle, unstageable: Secondary | ICD-10-CM | POA: Diagnosis not present

## 2020-02-20 DIAGNOSIS — M25531 Pain in right wrist: Secondary | ICD-10-CM | POA: Diagnosis not present

## 2020-02-20 DIAGNOSIS — G8929 Other chronic pain: Secondary | ICD-10-CM | POA: Diagnosis not present

## 2020-02-20 DIAGNOSIS — I2510 Atherosclerotic heart disease of native coronary artery without angina pectoris: Secondary | ICD-10-CM | POA: Diagnosis not present

## 2020-02-20 DIAGNOSIS — G8911 Acute pain due to trauma: Secondary | ICD-10-CM | POA: Diagnosis not present

## 2020-02-20 DIAGNOSIS — I10 Essential (primary) hypertension: Secondary | ICD-10-CM | POA: Diagnosis not present

## 2020-02-20 DIAGNOSIS — M545 Low back pain: Secondary | ICD-10-CM | POA: Diagnosis not present

## 2020-02-21 DIAGNOSIS — E119 Type 2 diabetes mellitus without complications: Secondary | ICD-10-CM | POA: Diagnosis not present

## 2020-02-21 DIAGNOSIS — G8929 Other chronic pain: Secondary | ICD-10-CM | POA: Diagnosis not present

## 2020-02-21 DIAGNOSIS — F419 Anxiety disorder, unspecified: Secondary | ICD-10-CM | POA: Diagnosis not present

## 2020-02-21 DIAGNOSIS — I2510 Atherosclerotic heart disease of native coronary artery without angina pectoris: Secondary | ICD-10-CM | POA: Diagnosis not present

## 2020-02-21 DIAGNOSIS — M545 Low back pain: Secondary | ICD-10-CM | POA: Diagnosis not present

## 2020-02-21 DIAGNOSIS — L89510 Pressure ulcer of right ankle, unstageable: Secondary | ICD-10-CM | POA: Diagnosis not present

## 2020-02-21 DIAGNOSIS — I10 Essential (primary) hypertension: Secondary | ICD-10-CM | POA: Diagnosis not present

## 2020-02-21 DIAGNOSIS — M25511 Pain in right shoulder: Secondary | ICD-10-CM | POA: Diagnosis not present

## 2020-02-21 DIAGNOSIS — F329 Major depressive disorder, single episode, unspecified: Secondary | ICD-10-CM | POA: Diagnosis not present

## 2020-02-21 DIAGNOSIS — F0391 Unspecified dementia with behavioral disturbance: Secondary | ICD-10-CM | POA: Diagnosis not present

## 2020-02-21 DIAGNOSIS — M25531 Pain in right wrist: Secondary | ICD-10-CM | POA: Diagnosis not present

## 2020-02-21 DIAGNOSIS — G8911 Acute pain due to trauma: Secondary | ICD-10-CM | POA: Diagnosis not present

## 2020-02-26 DIAGNOSIS — E119 Type 2 diabetes mellitus without complications: Secondary | ICD-10-CM | POA: Diagnosis not present

## 2020-02-26 DIAGNOSIS — L89510 Pressure ulcer of right ankle, unstageable: Secondary | ICD-10-CM | POA: Diagnosis not present

## 2020-02-26 DIAGNOSIS — G8911 Acute pain due to trauma: Secondary | ICD-10-CM | POA: Diagnosis not present

## 2020-02-26 DIAGNOSIS — M25531 Pain in right wrist: Secondary | ICD-10-CM | POA: Diagnosis not present

## 2020-02-26 DIAGNOSIS — M545 Low back pain: Secondary | ICD-10-CM | POA: Diagnosis not present

## 2020-02-26 DIAGNOSIS — G8929 Other chronic pain: Secondary | ICD-10-CM | POA: Diagnosis not present

## 2020-02-26 DIAGNOSIS — I2510 Atherosclerotic heart disease of native coronary artery without angina pectoris: Secondary | ICD-10-CM | POA: Diagnosis not present

## 2020-02-26 DIAGNOSIS — I10 Essential (primary) hypertension: Secondary | ICD-10-CM | POA: Diagnosis not present

## 2020-02-26 DIAGNOSIS — M25511 Pain in right shoulder: Secondary | ICD-10-CM | POA: Diagnosis not present

## 2020-02-28 DIAGNOSIS — I2510 Atherosclerotic heart disease of native coronary artery without angina pectoris: Secondary | ICD-10-CM | POA: Diagnosis not present

## 2020-02-28 DIAGNOSIS — E785 Hyperlipidemia, unspecified: Secondary | ICD-10-CM | POA: Diagnosis not present

## 2020-02-28 DIAGNOSIS — Z7951 Long term (current) use of inhaled steroids: Secondary | ICD-10-CM | POA: Diagnosis not present

## 2020-02-28 DIAGNOSIS — K219 Gastro-esophageal reflux disease without esophagitis: Secondary | ICD-10-CM | POA: Diagnosis not present

## 2020-02-28 DIAGNOSIS — M25511 Pain in right shoulder: Secondary | ICD-10-CM | POA: Diagnosis not present

## 2020-02-28 DIAGNOSIS — H9190 Unspecified hearing loss, unspecified ear: Secondary | ICD-10-CM | POA: Diagnosis not present

## 2020-02-28 DIAGNOSIS — Z794 Long term (current) use of insulin: Secondary | ICD-10-CM | POA: Diagnosis not present

## 2020-02-28 DIAGNOSIS — Z993 Dependence on wheelchair: Secondary | ICD-10-CM | POA: Diagnosis not present

## 2020-02-28 DIAGNOSIS — E119 Type 2 diabetes mellitus without complications: Secondary | ICD-10-CM | POA: Diagnosis not present

## 2020-02-28 DIAGNOSIS — M545 Low back pain: Secondary | ICD-10-CM | POA: Diagnosis not present

## 2020-02-28 DIAGNOSIS — Z8673 Personal history of transient ischemic attack (TIA), and cerebral infarction without residual deficits: Secondary | ICD-10-CM | POA: Diagnosis not present

## 2020-02-28 DIAGNOSIS — Z87440 Personal history of urinary (tract) infections: Secondary | ICD-10-CM | POA: Diagnosis not present

## 2020-02-28 DIAGNOSIS — G8911 Acute pain due to trauma: Secondary | ICD-10-CM | POA: Diagnosis not present

## 2020-02-28 DIAGNOSIS — L89510 Pressure ulcer of right ankle, unstageable: Secondary | ICD-10-CM | POA: Diagnosis not present

## 2020-02-28 DIAGNOSIS — I10 Essential (primary) hypertension: Secondary | ICD-10-CM | POA: Diagnosis not present

## 2020-02-28 DIAGNOSIS — Z9181 History of falling: Secondary | ICD-10-CM | POA: Diagnosis not present

## 2020-02-28 DIAGNOSIS — G8929 Other chronic pain: Secondary | ICD-10-CM | POA: Diagnosis not present

## 2020-02-28 DIAGNOSIS — Z7902 Long term (current) use of antithrombotics/antiplatelets: Secondary | ICD-10-CM | POA: Diagnosis not present

## 2020-02-28 DIAGNOSIS — M25531 Pain in right wrist: Secondary | ICD-10-CM | POA: Diagnosis not present

## 2020-03-17 DIAGNOSIS — Z794 Long term (current) use of insulin: Secondary | ICD-10-CM | POA: Diagnosis not present

## 2020-03-17 DIAGNOSIS — H9190 Unspecified hearing loss, unspecified ear: Secondary | ICD-10-CM | POA: Diagnosis not present

## 2020-03-17 DIAGNOSIS — M545 Low back pain: Secondary | ICD-10-CM | POA: Diagnosis not present

## 2020-03-17 DIAGNOSIS — Z8673 Personal history of transient ischemic attack (TIA), and cerebral infarction without residual deficits: Secondary | ICD-10-CM | POA: Diagnosis not present

## 2020-03-17 DIAGNOSIS — Z9181 History of falling: Secondary | ICD-10-CM | POA: Diagnosis not present

## 2020-03-17 DIAGNOSIS — E119 Type 2 diabetes mellitus without complications: Secondary | ICD-10-CM | POA: Diagnosis not present

## 2020-03-17 DIAGNOSIS — E785 Hyperlipidemia, unspecified: Secondary | ICD-10-CM | POA: Diagnosis not present

## 2020-03-17 DIAGNOSIS — Z7902 Long term (current) use of antithrombotics/antiplatelets: Secondary | ICD-10-CM | POA: Diagnosis not present

## 2020-03-17 DIAGNOSIS — I10 Essential (primary) hypertension: Secondary | ICD-10-CM | POA: Diagnosis not present

## 2020-03-17 DIAGNOSIS — Z993 Dependence on wheelchair: Secondary | ICD-10-CM | POA: Diagnosis not present

## 2020-03-17 DIAGNOSIS — Z87440 Personal history of urinary (tract) infections: Secondary | ICD-10-CM | POA: Diagnosis not present

## 2020-03-17 DIAGNOSIS — M25531 Pain in right wrist: Secondary | ICD-10-CM | POA: Diagnosis not present

## 2020-03-17 DIAGNOSIS — I2510 Atherosclerotic heart disease of native coronary artery without angina pectoris: Secondary | ICD-10-CM | POA: Diagnosis not present

## 2020-03-17 DIAGNOSIS — G8911 Acute pain due to trauma: Secondary | ICD-10-CM | POA: Diagnosis not present

## 2020-03-17 DIAGNOSIS — K219 Gastro-esophageal reflux disease without esophagitis: Secondary | ICD-10-CM | POA: Diagnosis not present

## 2020-03-17 DIAGNOSIS — G8929 Other chronic pain: Secondary | ICD-10-CM | POA: Diagnosis not present

## 2020-03-17 DIAGNOSIS — Z7951 Long term (current) use of inhaled steroids: Secondary | ICD-10-CM | POA: Diagnosis not present

## 2020-03-17 DIAGNOSIS — M25511 Pain in right shoulder: Secondary | ICD-10-CM | POA: Diagnosis not present

## 2020-03-17 DIAGNOSIS — L89510 Pressure ulcer of right ankle, unstageable: Secondary | ICD-10-CM | POA: Diagnosis not present

## 2020-03-20 DIAGNOSIS — Z7902 Long term (current) use of antithrombotics/antiplatelets: Secondary | ICD-10-CM | POA: Diagnosis not present

## 2020-03-20 DIAGNOSIS — M25531 Pain in right wrist: Secondary | ICD-10-CM | POA: Diagnosis not present

## 2020-03-20 DIAGNOSIS — K219 Gastro-esophageal reflux disease without esophagitis: Secondary | ICD-10-CM | POA: Diagnosis not present

## 2020-03-20 DIAGNOSIS — Z7951 Long term (current) use of inhaled steroids: Secondary | ICD-10-CM | POA: Diagnosis not present

## 2020-03-20 DIAGNOSIS — Z9181 History of falling: Secondary | ICD-10-CM | POA: Diagnosis not present

## 2020-03-20 DIAGNOSIS — L89510 Pressure ulcer of right ankle, unstageable: Secondary | ICD-10-CM | POA: Diagnosis not present

## 2020-03-20 DIAGNOSIS — G8929 Other chronic pain: Secondary | ICD-10-CM | POA: Diagnosis not present

## 2020-03-20 DIAGNOSIS — I10 Essential (primary) hypertension: Secondary | ICD-10-CM | POA: Diagnosis not present

## 2020-03-20 DIAGNOSIS — E119 Type 2 diabetes mellitus without complications: Secondary | ICD-10-CM | POA: Diagnosis not present

## 2020-03-20 DIAGNOSIS — Z794 Long term (current) use of insulin: Secondary | ICD-10-CM | POA: Diagnosis not present

## 2020-03-20 DIAGNOSIS — M545 Low back pain: Secondary | ICD-10-CM | POA: Diagnosis not present

## 2020-03-20 DIAGNOSIS — M25511 Pain in right shoulder: Secondary | ICD-10-CM | POA: Diagnosis not present

## 2020-03-20 DIAGNOSIS — H9190 Unspecified hearing loss, unspecified ear: Secondary | ICD-10-CM | POA: Diagnosis not present

## 2020-03-20 DIAGNOSIS — E785 Hyperlipidemia, unspecified: Secondary | ICD-10-CM | POA: Diagnosis not present

## 2020-03-20 DIAGNOSIS — Z87440 Personal history of urinary (tract) infections: Secondary | ICD-10-CM | POA: Diagnosis not present

## 2020-03-20 DIAGNOSIS — Z8673 Personal history of transient ischemic attack (TIA), and cerebral infarction without residual deficits: Secondary | ICD-10-CM | POA: Diagnosis not present

## 2020-03-20 DIAGNOSIS — I2510 Atherosclerotic heart disease of native coronary artery without angina pectoris: Secondary | ICD-10-CM | POA: Diagnosis not present

## 2020-03-20 DIAGNOSIS — Z993 Dependence on wheelchair: Secondary | ICD-10-CM | POA: Diagnosis not present

## 2020-03-20 DIAGNOSIS — G8911 Acute pain due to trauma: Secondary | ICD-10-CM | POA: Diagnosis not present

## 2020-03-25 DIAGNOSIS — K219 Gastro-esophageal reflux disease without esophagitis: Secondary | ICD-10-CM | POA: Diagnosis not present

## 2020-03-25 DIAGNOSIS — G8911 Acute pain due to trauma: Secondary | ICD-10-CM | POA: Diagnosis not present

## 2020-03-25 DIAGNOSIS — I10 Essential (primary) hypertension: Secondary | ICD-10-CM | POA: Diagnosis not present

## 2020-03-25 DIAGNOSIS — H9190 Unspecified hearing loss, unspecified ear: Secondary | ICD-10-CM | POA: Diagnosis not present

## 2020-03-25 DIAGNOSIS — Z7902 Long term (current) use of antithrombotics/antiplatelets: Secondary | ICD-10-CM | POA: Diagnosis not present

## 2020-03-25 DIAGNOSIS — Z87440 Personal history of urinary (tract) infections: Secondary | ICD-10-CM | POA: Diagnosis not present

## 2020-03-25 DIAGNOSIS — E785 Hyperlipidemia, unspecified: Secondary | ICD-10-CM | POA: Diagnosis not present

## 2020-03-25 DIAGNOSIS — M25531 Pain in right wrist: Secondary | ICD-10-CM | POA: Diagnosis not present

## 2020-03-25 DIAGNOSIS — I2510 Atherosclerotic heart disease of native coronary artery without angina pectoris: Secondary | ICD-10-CM | POA: Diagnosis not present

## 2020-03-25 DIAGNOSIS — Z794 Long term (current) use of insulin: Secondary | ICD-10-CM | POA: Diagnosis not present

## 2020-03-25 DIAGNOSIS — G8929 Other chronic pain: Secondary | ICD-10-CM | POA: Diagnosis not present

## 2020-03-25 DIAGNOSIS — Z9181 History of falling: Secondary | ICD-10-CM | POA: Diagnosis not present

## 2020-03-25 DIAGNOSIS — Z7951 Long term (current) use of inhaled steroids: Secondary | ICD-10-CM | POA: Diagnosis not present

## 2020-03-25 DIAGNOSIS — M545 Low back pain: Secondary | ICD-10-CM | POA: Diagnosis not present

## 2020-03-25 DIAGNOSIS — Z8673 Personal history of transient ischemic attack (TIA), and cerebral infarction without residual deficits: Secondary | ICD-10-CM | POA: Diagnosis not present

## 2020-03-25 DIAGNOSIS — Z993 Dependence on wheelchair: Secondary | ICD-10-CM | POA: Diagnosis not present

## 2020-03-25 DIAGNOSIS — E119 Type 2 diabetes mellitus without complications: Secondary | ICD-10-CM | POA: Diagnosis not present

## 2020-03-25 DIAGNOSIS — M25511 Pain in right shoulder: Secondary | ICD-10-CM | POA: Diagnosis not present

## 2020-03-25 DIAGNOSIS — L89510 Pressure ulcer of right ankle, unstageable: Secondary | ICD-10-CM | POA: Diagnosis not present

## 2020-03-27 DIAGNOSIS — M545 Low back pain: Secondary | ICD-10-CM | POA: Diagnosis not present

## 2020-03-27 DIAGNOSIS — Z7951 Long term (current) use of inhaled steroids: Secondary | ICD-10-CM | POA: Diagnosis not present

## 2020-03-27 DIAGNOSIS — M25511 Pain in right shoulder: Secondary | ICD-10-CM | POA: Diagnosis not present

## 2020-03-27 DIAGNOSIS — G8911 Acute pain due to trauma: Secondary | ICD-10-CM | POA: Diagnosis not present

## 2020-03-27 DIAGNOSIS — Z8673 Personal history of transient ischemic attack (TIA), and cerebral infarction without residual deficits: Secondary | ICD-10-CM | POA: Diagnosis not present

## 2020-03-27 DIAGNOSIS — E785 Hyperlipidemia, unspecified: Secondary | ICD-10-CM | POA: Diagnosis not present

## 2020-03-27 DIAGNOSIS — Z9181 History of falling: Secondary | ICD-10-CM | POA: Diagnosis not present

## 2020-03-27 DIAGNOSIS — E119 Type 2 diabetes mellitus without complications: Secondary | ICD-10-CM | POA: Diagnosis not present

## 2020-03-27 DIAGNOSIS — H9190 Unspecified hearing loss, unspecified ear: Secondary | ICD-10-CM | POA: Diagnosis not present

## 2020-03-27 DIAGNOSIS — M25531 Pain in right wrist: Secondary | ICD-10-CM | POA: Diagnosis not present

## 2020-03-27 DIAGNOSIS — Z7902 Long term (current) use of antithrombotics/antiplatelets: Secondary | ICD-10-CM | POA: Diagnosis not present

## 2020-03-27 DIAGNOSIS — Z794 Long term (current) use of insulin: Secondary | ICD-10-CM | POA: Diagnosis not present

## 2020-03-27 DIAGNOSIS — I10 Essential (primary) hypertension: Secondary | ICD-10-CM | POA: Diagnosis not present

## 2020-03-27 DIAGNOSIS — K219 Gastro-esophageal reflux disease without esophagitis: Secondary | ICD-10-CM | POA: Diagnosis not present

## 2020-03-27 DIAGNOSIS — Z87440 Personal history of urinary (tract) infections: Secondary | ICD-10-CM | POA: Diagnosis not present

## 2020-03-27 DIAGNOSIS — I2510 Atherosclerotic heart disease of native coronary artery without angina pectoris: Secondary | ICD-10-CM | POA: Diagnosis not present

## 2020-03-27 DIAGNOSIS — G8929 Other chronic pain: Secondary | ICD-10-CM | POA: Diagnosis not present

## 2020-03-27 DIAGNOSIS — Z993 Dependence on wheelchair: Secondary | ICD-10-CM | POA: Diagnosis not present

## 2020-03-27 DIAGNOSIS — L89510 Pressure ulcer of right ankle, unstageable: Secondary | ICD-10-CM | POA: Diagnosis not present

## 2020-04-06 DIAGNOSIS — I2510 Atherosclerotic heart disease of native coronary artery without angina pectoris: Secondary | ICD-10-CM | POA: Diagnosis not present

## 2020-04-06 DIAGNOSIS — N183 Chronic kidney disease, stage 3 unspecified: Secondary | ICD-10-CM | POA: Diagnosis not present

## 2020-04-06 DIAGNOSIS — I129 Hypertensive chronic kidney disease with stage 1 through stage 4 chronic kidney disease, or unspecified chronic kidney disease: Secondary | ICD-10-CM | POA: Diagnosis not present

## 2020-04-06 DIAGNOSIS — N179 Acute kidney failure, unspecified: Secondary | ICD-10-CM | POA: Diagnosis not present

## 2020-04-06 DIAGNOSIS — Z794 Long term (current) use of insulin: Secondary | ICD-10-CM | POA: Diagnosis not present

## 2020-04-06 DIAGNOSIS — W19XXXA Unspecified fall, initial encounter: Secondary | ICD-10-CM | POA: Diagnosis not present

## 2020-04-06 DIAGNOSIS — R5381 Other malaise: Secondary | ICD-10-CM | POA: Diagnosis not present

## 2020-04-06 DIAGNOSIS — E1122 Type 2 diabetes mellitus with diabetic chronic kidney disease: Secondary | ICD-10-CM | POA: Diagnosis not present

## 2020-04-06 DIAGNOSIS — Z79899 Other long term (current) drug therapy: Secondary | ICD-10-CM | POA: Diagnosis not present

## 2020-04-06 DIAGNOSIS — R109 Unspecified abdominal pain: Secondary | ICD-10-CM | POA: Diagnosis not present

## 2020-04-06 DIAGNOSIS — E1165 Type 2 diabetes mellitus with hyperglycemia: Secondary | ICD-10-CM | POA: Diagnosis not present

## 2020-04-06 DIAGNOSIS — S3991XA Unspecified injury of abdomen, initial encounter: Secondary | ICD-10-CM | POA: Diagnosis not present

## 2020-04-06 DIAGNOSIS — Z7401 Bed confinement status: Secondary | ICD-10-CM | POA: Diagnosis not present

## 2020-04-06 DIAGNOSIS — Z743 Need for continuous supervision: Secondary | ICD-10-CM | POA: Diagnosis not present

## 2020-04-06 DIAGNOSIS — G301 Alzheimer's disease with late onset: Secondary | ICD-10-CM | POA: Diagnosis not present

## 2020-04-06 DIAGNOSIS — K219 Gastro-esophageal reflux disease without esophagitis: Secondary | ICD-10-CM | POA: Diagnosis not present

## 2020-04-06 DIAGNOSIS — S199XXA Unspecified injury of neck, initial encounter: Secondary | ICD-10-CM | POA: Diagnosis not present

## 2020-04-06 DIAGNOSIS — S0003XA Contusion of scalp, initial encounter: Secondary | ICD-10-CM | POA: Diagnosis not present

## 2020-04-06 DIAGNOSIS — G4700 Insomnia, unspecified: Secondary | ICD-10-CM | POA: Diagnosis not present

## 2020-04-06 DIAGNOSIS — R404 Transient alteration of awareness: Secondary | ICD-10-CM | POA: Diagnosis not present

## 2020-04-06 DIAGNOSIS — T07XXXA Unspecified multiple injuries, initial encounter: Secondary | ICD-10-CM | POA: Diagnosis not present

## 2020-04-06 DIAGNOSIS — S0083XA Contusion of other part of head, initial encounter: Secondary | ICD-10-CM | POA: Diagnosis not present

## 2020-04-06 DIAGNOSIS — I252 Old myocardial infarction: Secondary | ICD-10-CM | POA: Diagnosis not present

## 2020-04-06 DIAGNOSIS — Z8673 Personal history of transient ischemic attack (TIA), and cerebral infarction without residual deficits: Secondary | ICD-10-CM | POA: Diagnosis not present

## 2020-04-06 DIAGNOSIS — R400 Somnolence: Secondary | ICD-10-CM | POA: Diagnosis not present

## 2020-04-06 DIAGNOSIS — M2550 Pain in unspecified joint: Secondary | ICD-10-CM | POA: Diagnosis not present

## 2020-04-06 DIAGNOSIS — M1990 Unspecified osteoarthritis, unspecified site: Secondary | ICD-10-CM | POA: Diagnosis not present

## 2020-04-06 DIAGNOSIS — E860 Dehydration: Secondary | ICD-10-CM | POA: Diagnosis not present

## 2020-04-07 DIAGNOSIS — W19XXXA Unspecified fall, initial encounter: Secondary | ICD-10-CM | POA: Diagnosis not present

## 2020-04-07 DIAGNOSIS — M545 Low back pain: Secondary | ICD-10-CM | POA: Diagnosis not present

## 2020-04-07 DIAGNOSIS — S3991XA Unspecified injury of abdomen, initial encounter: Secondary | ICD-10-CM | POA: Diagnosis not present

## 2020-04-07 DIAGNOSIS — G301 Alzheimer's disease with late onset: Secondary | ICD-10-CM

## 2020-04-07 DIAGNOSIS — N179 Acute kidney failure, unspecified: Secondary | ICD-10-CM | POA: Diagnosis not present

## 2020-04-07 DIAGNOSIS — E860 Dehydration: Secondary | ICD-10-CM | POA: Diagnosis not present

## 2020-04-07 DIAGNOSIS — Z743 Need for continuous supervision: Secondary | ICD-10-CM | POA: Diagnosis not present

## 2020-04-07 DIAGNOSIS — I2510 Atherosclerotic heart disease of native coronary artery without angina pectoris: Secondary | ICD-10-CM | POA: Diagnosis not present

## 2020-04-07 DIAGNOSIS — R52 Pain, unspecified: Secondary | ICD-10-CM | POA: Diagnosis not present

## 2020-04-07 DIAGNOSIS — S0083XA Contusion of other part of head, initial encounter: Secondary | ICD-10-CM | POA: Diagnosis not present

## 2020-04-07 DIAGNOSIS — K219 Gastro-esophageal reflux disease without esophagitis: Secondary | ICD-10-CM

## 2020-04-07 DIAGNOSIS — R109 Unspecified abdominal pain: Secondary | ICD-10-CM | POA: Diagnosis not present

## 2020-04-07 DIAGNOSIS — R404 Transient alteration of awareness: Secondary | ICD-10-CM | POA: Diagnosis not present

## 2020-04-07 DIAGNOSIS — S0003XA Contusion of scalp, initial encounter: Secondary | ICD-10-CM | POA: Diagnosis not present

## 2020-04-08 DIAGNOSIS — E1122 Type 2 diabetes mellitus with diabetic chronic kidney disease: Secondary | ICD-10-CM | POA: Diagnosis not present

## 2020-04-08 DIAGNOSIS — S0003XA Contusion of scalp, initial encounter: Secondary | ICD-10-CM | POA: Diagnosis not present

## 2020-04-08 DIAGNOSIS — N183 Chronic kidney disease, stage 3 unspecified: Secondary | ICD-10-CM | POA: Diagnosis not present

## 2020-04-08 DIAGNOSIS — I2510 Atherosclerotic heart disease of native coronary artery without angina pectoris: Secondary | ICD-10-CM | POA: Diagnosis not present

## 2020-04-08 DIAGNOSIS — E1165 Type 2 diabetes mellitus with hyperglycemia: Secondary | ICD-10-CM | POA: Diagnosis not present

## 2020-04-08 DIAGNOSIS — R404 Transient alteration of awareness: Secondary | ICD-10-CM | POA: Diagnosis not present

## 2020-04-08 DIAGNOSIS — Z8673 Personal history of transient ischemic attack (TIA), and cerebral infarction without residual deficits: Secondary | ICD-10-CM | POA: Diagnosis not present

## 2020-04-08 DIAGNOSIS — K219 Gastro-esophageal reflux disease without esophagitis: Secondary | ICD-10-CM | POA: Diagnosis not present

## 2020-04-08 DIAGNOSIS — N179 Acute kidney failure, unspecified: Secondary | ICD-10-CM | POA: Diagnosis not present

## 2020-04-08 DIAGNOSIS — M1990 Unspecified osteoarthritis, unspecified site: Secondary | ICD-10-CM | POA: Diagnosis not present

## 2020-04-08 DIAGNOSIS — Z79899 Other long term (current) drug therapy: Secondary | ICD-10-CM | POA: Diagnosis not present

## 2020-04-08 DIAGNOSIS — I252 Old myocardial infarction: Secondary | ICD-10-CM | POA: Diagnosis not present

## 2020-04-08 DIAGNOSIS — G4700 Insomnia, unspecified: Secondary | ICD-10-CM | POA: Diagnosis not present

## 2020-04-08 DIAGNOSIS — E860 Dehydration: Secondary | ICD-10-CM | POA: Diagnosis not present

## 2020-04-08 DIAGNOSIS — G301 Alzheimer's disease with late onset: Secondary | ICD-10-CM | POA: Diagnosis not present

## 2020-04-08 DIAGNOSIS — I129 Hypertensive chronic kidney disease with stage 1 through stage 4 chronic kidney disease, or unspecified chronic kidney disease: Secondary | ICD-10-CM | POA: Diagnosis not present

## 2020-04-08 DIAGNOSIS — Z794 Long term (current) use of insulin: Secondary | ICD-10-CM | POA: Diagnosis not present

## 2020-04-08 DIAGNOSIS — S0083XA Contusion of other part of head, initial encounter: Secondary | ICD-10-CM | POA: Diagnosis not present

## 2020-04-08 DIAGNOSIS — R400 Somnolence: Secondary | ICD-10-CM | POA: Diagnosis not present

## 2020-04-15 DIAGNOSIS — R079 Chest pain, unspecified: Secondary | ICD-10-CM | POA: Diagnosis not present

## 2020-04-22 ENCOUNTER — Emergency Department (HOSPITAL_COMMUNITY)
Admission: EM | Admit: 2020-04-22 | Discharge: 2020-04-23 | Disposition: A | Discharge status: Home / Self Care | Payer: Medicare Other | Attending: Emergency Medicine | Admitting: Emergency Medicine

## 2020-04-22 ENCOUNTER — Encounter (HOSPITAL_COMMUNITY): Payer: Self-pay | Admitting: Emergency Medicine

## 2020-04-22 DIAGNOSIS — I2510 Atherosclerotic heart disease of native coronary artery without angina pectoris: Secondary | ICD-10-CM | POA: Insufficient documentation

## 2020-04-22 DIAGNOSIS — Z743 Need for continuous supervision: Secondary | ICD-10-CM | POA: Diagnosis not present

## 2020-04-22 DIAGNOSIS — E119 Type 2 diabetes mellitus without complications: Secondary | ICD-10-CM | POA: Diagnosis not present

## 2020-04-22 DIAGNOSIS — Z79899 Other long term (current) drug therapy: Secondary | ICD-10-CM | POA: Diagnosis not present

## 2020-04-22 DIAGNOSIS — R404 Transient alteration of awareness: Secondary | ICD-10-CM | POA: Diagnosis not present

## 2020-04-22 DIAGNOSIS — R32 Unspecified urinary incontinence: Secondary | ICD-10-CM | POA: Insufficient documentation

## 2020-04-22 DIAGNOSIS — I10 Essential (primary) hypertension: Secondary | ICD-10-CM | POA: Diagnosis not present

## 2020-04-22 DIAGNOSIS — E1165 Type 2 diabetes mellitus with hyperglycemia: Secondary | ICD-10-CM | POA: Diagnosis not present

## 2020-04-22 DIAGNOSIS — R109 Unspecified abdominal pain: Secondary | ICD-10-CM | POA: Insufficient documentation

## 2020-04-22 DIAGNOSIS — Z794 Long term (current) use of insulin: Secondary | ICD-10-CM | POA: Insufficient documentation

## 2020-04-22 DIAGNOSIS — Z8546 Personal history of malignant neoplasm of prostate: Secondary | ICD-10-CM | POA: Insufficient documentation

## 2020-04-22 DIAGNOSIS — M549 Dorsalgia, unspecified: Secondary | ICD-10-CM | POA: Diagnosis not present

## 2020-04-22 DIAGNOSIS — M545 Low back pain: Secondary | ICD-10-CM | POA: Insufficient documentation

## 2020-04-22 DIAGNOSIS — R0989 Other specified symptoms and signs involving the circulatory and respiratory systems: Secondary | ICD-10-CM | POA: Diagnosis not present

## 2020-04-22 DIAGNOSIS — R1030 Lower abdominal pain, unspecified: Secondary | ICD-10-CM | POA: Diagnosis not present

## 2020-04-22 DIAGNOSIS — R4182 Altered mental status, unspecified: Secondary | ICD-10-CM | POA: Diagnosis present

## 2020-04-22 DIAGNOSIS — R52 Pain, unspecified: Secondary | ICD-10-CM | POA: Diagnosis not present

## 2020-04-22 LAB — COMPREHENSIVE METABOLIC PANEL
ALT: 15 U/L (ref 0–44)
AST: 20 U/L (ref 15–41)
Albumin: 2.8 g/dL — ABNORMAL LOW (ref 3.5–5.0)
Alkaline Phosphatase: 98 U/L (ref 38–126)
Anion gap: 9 (ref 5–15)
BUN: 29 mg/dL — ABNORMAL HIGH (ref 8–23)
CO2: 25 mmol/L (ref 22–32)
Calcium: 8.9 mg/dL (ref 8.9–10.3)
Chloride: 108 mmol/L (ref 98–111)
Creatinine, Ser: 1.94 mg/dL — ABNORMAL HIGH (ref 0.61–1.24)
GFR calc Af Amer: 36 mL/min — ABNORMAL LOW (ref >60)
GFR calc non Af Amer: 31 mL/min — ABNORMAL LOW (ref >60)
Glucose, Bld: 192 mg/dL — ABNORMAL HIGH (ref 70–99)
Potassium: 4.2 mmol/L (ref 3.5–5.1)
Sodium: 142 mmol/L (ref 135–145)
Total Bilirubin: 0.5 mg/dL (ref 0.3–1.2)
Total Protein: 5.9 g/dL — ABNORMAL LOW (ref 6.5–8.1)

## 2020-04-22 LAB — CBC WITH DIFFERENTIAL/PLATELET
Abs Immature Granulocytes: 0.05 10*3/uL (ref 0.00–0.07)
Basophils Absolute: 0 10*3/uL (ref 0.0–0.1)
Basophils Relative: 0 %
Eosinophils Absolute: 0.1 10*3/uL (ref 0.0–0.5)
Eosinophils Relative: 1 %
HCT: 35.8 % — ABNORMAL LOW (ref 39.0–52.0)
Hemoglobin: 11.4 g/dL — ABNORMAL LOW (ref 13.0–17.0)
Immature Granulocytes: 1 %
Lymphocytes Relative: 17 %
Lymphs Abs: 1.8 10*3/uL (ref 0.7–4.0)
MCH: 31.5 pg (ref 26.0–34.0)
MCHC: 31.8 g/dL (ref 30.0–36.0)
MCV: 98.9 fL (ref 80.0–100.0)
Monocytes Absolute: 0.9 10*3/uL (ref 0.1–1.0)
Monocytes Relative: 9 %
Neutro Abs: 7.6 10*3/uL (ref 1.7–7.7)
Neutrophils Relative %: 72 %
Platelets: 239 10*3/uL (ref 150–400)
RBC: 3.62 MIL/uL — ABNORMAL LOW (ref 4.22–5.81)
RDW: 13.4 % (ref 11.5–15.5)
WBC: 10.4 10*3/uL (ref 4.0–10.5)
nRBC: 0 % (ref 0.0–0.2)

## 2020-04-22 LAB — URINALYSIS, ROUTINE W REFLEX MICROSCOPIC
Bacteria, UA: NONE SEEN
Bilirubin Urine: NEGATIVE
Glucose, UA: >=500 mg/dL — AB
Hgb urine dipstick: NEGATIVE
Ketones, ur: NEGATIVE mg/dL
Leukocytes,Ua: NEGATIVE
Nitrite: NEGATIVE
Protein, ur: 30 mg/dL — AB
Specific Gravity, Urine: 1.024 (ref 1.005–1.030)
pH: 5 (ref 5.0–8.0)

## 2020-04-22 NOTE — ED Provider Notes (Addendum)
[facility] EMERGENCY DEPARTMENT Provider Note   CSN: ID:[id number] Arrival date & time: 04/22/20  2234     History Chief Complaint  Patient presents with  . Altered Mental Status    Clayton Johnson is a 86 y.o. male.  Patient with past medical history notable for prior stroke, hypertension, diabetes, hyperlipidemia, CAD, presents to the emergency department with a chief complaint of confusion.  He comes from [facility], a skilled nursing facility.  Reportedly patient has been confused for the past 3 days.  He has also been having urinary incontinence and has not been walking.  He has had several falls recently.  Patient complains of low back pain and lower abdominal pain.  He denies any chest pain.  History is limited secondary to change in mental status.  Level 5 caveat applies.  The history is provided by the patient. No language interpreter was used.       Past Medical History:  Diagnosis Date  . Anxiety disorder   . Bladder cancer ([place])   . Coronary artery disease   . Degenerative arthritis   . Diabetes ([place])   . Dyslipidemia   . GERD (gastroesophageal reflux disease)   . Hypertension   . Obesity   . Stroke ([place])   . TIA (transient ischemic attack)    MULTIPLE,BIHEMISPHERIC    Patient Active Problem List   Diagnosis Date Noted  . Nonsustained ventricular tachycardia ([place]) 11/07/2018  . Cerebral thrombosis with cerebral infarction 07/17/2018  . Late effect of cerebrovascular accident (CVA)   . Diabetes mellitus type 2 in obese ([place])   . History of TIAs   . Dementia without behavioral disturbance ([place])   . Malignant neoplasm of urinary bladder ([place])   . Stage 3 chronic kidney disease   . Paroxysmal SVT (supraventricular tachycardia) ([place]) 07/14/2018  . Diabetes ([place])   . Hypertension   . Anxiety disorder   . Onychomycosis due to dermatophyte 01/27/2016  . Coronary artery disease involving native coronary artery of native heart  without angina pectoris 09/12/2015  . Mixed hyperlipidemia 09/12/2015  . Unspecified transient cerebral ischemia 12/05/2012  . Cerebrovascular disease, unspecified 12/05/2012    Past Surgical History:  Procedure Laterality Date  . BLADDER REPAIR    . CORONARY STENT PLACEMENT    . NASAL SINUS SURGERY    . SHOULDER OPEN ROTATOR CUFF REPAIR Right   . TOTAL KNEE ARTHROPLASTY Right        Family History  Problem Relation Age of Onset  . Heart disease Mother   . Stroke Father   . Renal Disease Sister   . Heart attack Brother     Social History   Tobacco Use  . Smoking status: Never Smoker  . Smokeless tobacco: Never Used  Substance Use Topics  . Alcohol use: No  . Drug use: No    Home Medications Prior to Admission medications   Medication Sig Start Date End Date Taking? Authorizing Provider  acetaminophen (TYLENOL) 500 MG tablet Take 1,000 mg by mouth every 6 (six) hours as needed (for pain or headaches).    [provider]  amiodarone (PACERONE) 200 MG tablet Take 1 tablet (200 mg total) by mouth daily. 10/13/18   Camnitz, Ocie Doyne, MD  apixaban (ELIQUIS) 2.5 MG TABS tablet Take 1 tablet (2.5 mg total) by mouth 2 (two) times daily. 08/28/18   Garvin Fila, MD  Calcium Carbonate-Vitamin D (OSCAL 500/200 D-3 PO) Take by mouth.    [provider]  cetirizine (ZYRTEC) 10 MG tablet Take 10 mg by mouth daily.    [provider]  clopidogrel (PLAVIX) 75 MG tablet Take 75 mg by mouth daily.    [provider]  donepezil (ARICEPT) 10 MG tablet Take 10 mg by mouth at bedtime.    [provider]  finasteride (PROSCAR) 5 MG tablet Take 5 mg by mouth at bedtime.    [provider]  fluticasone (FLONASE) 50 MCG/ACT nasal spray Place 1-2 sprays into both nostrils daily.    [provider]  insulin glargine (LANTUS) 100 UNIT/ML injection Inject 25 Units into the skin daily.    [provider]  insulin regular  (NOVOLIN R,HUMULIN R) 100 units/mL injection Inject into the skin 3 (three) times daily before meals.    [provider]  metoprolol tartrate (LOPRESSOR) 50 MG tablet Take 1 tablet (50 mg total) by mouth 2 (two) times daily. 09/21/18 12/20/18  Camnitz, Ocie Doyne, MD  pravastatin (PRAVACHOL) 10 MG tablet Take 10 mg by mouth daily.    [provider]  tamsulosin (FLOMAX) 0.4 MG CAPS capsule Take 0.4 mg by mouth daily after supper.    [provider]    Allergies    Lactose intolerance (gi)  Review of Systems   Review of Systems  All other systems reviewed and are negative.   Physical Exam Updated Vital Signs BP (!) 152/72 (BP Location: Right Arm)   Pulse 73   Temp 98.7 F (37.1 C) (Oral)   Resp (!) 22   Ht 5\' 8"  (1.727 m)   Wt 90.7 kg   SpO2 100%   BMI 30.41 kg/m   Physical Exam Vitals and nursing note reviewed.  Constitutional:      Appearance: He is well-developed.  HENT:     Head: Normocephalic and atraumatic.  Eyes:     Conjunctiva/sclera: Conjunctivae normal.     Comments: Bilateral periorbital contusions, appear old  Cardiovascular:     Rate and Rhythm: Normal rate and regular rhythm.     Heart sounds: No murmur.  Pulmonary:     Effort: Pulmonary effort is normal. No respiratory distress.     Breath sounds: Normal breath sounds.  Abdominal:     Palpations: Abdomen is soft.     Tenderness: There is abdominal tenderness.     Comments: Mild lower abdominal tenderness  Musculoskeletal:        General: Normal range of motion.     Cervical back: Neck supple.  Skin:    General: Skin is warm and dry.     Comments: Very small skin tear and ecchymosis to the right flank  Neurological:     Mental Status: He is alert and oriented to person, place, and time.     Comments: Answers basic questions normally, A&Ox3, but more detailed questions he struggles with There is slight weakness in the left leg, decreased strength with plantar flexion and  great toe extension when compared to the right  Psychiatric:        Mood and Affect: Mood normal.        Behavior: Behavior normal.     ED Results / Procedures / Treatments   Labs (all labs ordered are listed, but only abnormal results are displayed) Labs Reviewed  CBC WITH DIFFERENTIAL/PLATELET  COMPREHENSIVE METABOLIC PANEL  URINALYSIS, ROUTINE W REFLEX MICROSCOPIC    EKG None  Radiology CT Head Wo Contrast  Result Date: 04/23/2020 CLINICAL DATA:  Altered mental status EXAM:  CT HEAD WITHOUT CONTRAST TECHNIQUE: Contiguous axial images were obtained from the base of the skull through the vertex without intravenous contrast. COMPARISON:  Brain MRI 04/08/2020 FINDINGS: Brain: There is no mass, hemorrhage or extra-axial collection. There is generalized atrophy without lobar predilection. Hypodensity of the white matter is most commonly associated with chronic microvascular disease. Old right occipital lobe infarct and multiple old central small vessel infarcts. Vascular disease appears unchanged compared to prior MRI. Vascular: Atherosclerotic calcification of the vertebral and internal carotid arteries at the skull base. No abnormal hyperdensity of the major intracranial arteries or dural venous sinuses. Skull: The visualized skull base, calvarium and extracranial soft tissues are normal. Sinuses/Orbits: No fluid levels or advanced mucosal thickening of the visualized paranasal sinuses. No mastoid or middle ear effusion. The orbits are normal. IMPRESSION: 1. No acute intracranial abnormality. 2. Old right occipital lobe infarct and multiple old central small vessel infarcts. Advanced chronic small vessel disease. Electronically Signed   By: Ulyses Jarred M.D.   On: 04/23/2020 01:11   CT ABDOMEN PELVIS W CONTRAST  Result Date: 04/23/2020 CLINICAL DATA:  Acute nonlocalized abdominal pain. Several falls in the last few days. History of bladder cancer. EXAM: CT ABDOMEN AND PELVIS WITH CONTRAST  TECHNIQUE: Multidetector CT imaging of the abdomen and pelvis was performed using the standard protocol following bolus administration of intravenous contrast. CONTRAST:  18mL OMNIPAQUE IOHEXOL 300 MG/ML  SOLN COMPARISON:  CT 2 and half weeks ago 04/07/2020 FINDINGS: Lower chest: Cardiomegaly with coronary artery calcifications. Lower lobe atelectasis. No pleural fluid. No basilar pulmonary nodule. Hepatobiliary: No focal liver abnormality is seen. No evidence of injury. No gallstones, gallbladder wall thickening, or biliary dilatation. Pancreas: Parenchymal atrophy. No ductal dilatation or inflammation. Spleen: Calcified granuloma. Normal in size. No evidence of injury. Adrenals/Urinary Tract: No adrenal nodule or hemorrhage. No hydronephrosis or perinephric edema. Mild cortical scarring in the lower right kidney. Lobulated renal contours. Both ureters are decompressed. There is symmetric renal excretion on delayed phase imaging. Partially distended urinary bladder with mild diffuse wall thickening. Faint perivesicular fat stranding. Stomach/Bowel: Ingested material within the stomach. No small bowel obstruction or inflammation. Fecalization of distal small bowel contents. Normal appendix. Moderate colonic stool burden. No colonic wall thickening. Sigmoid colon is tortuous. Scattered sigmoid colonic diverticula without diverticulitis. Vascular/Lymphatic: Aorto bi-iliac atherosclerosis. No aortic aneurysm. The portal vein is patent. Circumaortic left renal vein. No adenopathy. Reproductive: Prostate gland is unremarkable. Other: No free air or ascites. Tiny fat containing umbilical hernia. Minimal fat in the inguinal canals. Musculoskeletal: Lumbar spine assessed on concurrent lumbar CT, reported separately. Bony pelvis is intact. IMPRESSION: 1. Mild diffuse bladder wall thickening with mild perivesicular fat stranding, can be seen with cystitis. 2. No additional acute abnormality in the abdomen/pelvis. 3. Minor  colonic diverticulosis without diverticulitis. Aortic Atherosclerosis (ICD10-I70.0). Electronically Signed   By: Keith Rake M.D.   On: 04/23/2020 01:01   CT L-SPINE NO CHARGE  Result Date: 04/23/2020 CLINICAL DATA:  Back pain EXAM: CT LUMBAR SPINE WITHOUT CONTRAST TECHNIQUE: Multidetector CT imaging of the lumbar spine was performed without intravenous contrast administration. Multiplanar CT image reconstructions were also generated. COMPARISON:  06/11/2017 FINDINGS: Segmentation: 5 lumbar type vertebrae. Alignment: Normal. Vertebrae: Mild multilevel degenerative height loss. No acute fracture. Paraspinal and other soft tissues: Calcific aortic atherosclerosis. Disc levels: L1-2: No spinal canal stenosis. L2-3: Mild spinal canal stenosis secondary to disc bulge and endplate spurring. Mild right foraminal stenosis. This level is unchanged. L3-4: Unchanged mild disc bulge. Mild  spinal canal stenosis. Slight worsening of moderate bilateral foraminal stenosis. L4-5: Intermediate disc bulge, unchanged. Mild spinal canal stenosis. Moderate bilateral foraminal stenosis is unchanged. L5-S1: Disc bulge with endplate spurring. Moderate bilateral foraminal stenosis is unchanged. No spinal canal stenosis. IMPRESSION: 1. No acute fracture or static subluxation of the lumbar spine. 2. Slight worsening of moderate bilateral L3-4 foraminal stenosis. 3. Unchanged moderate bilateral L4-5 and L5-S1 foraminal stenosis. 4. Aortic Atherosclerosis (ICD10-I70.0). Electronically Signed   By: Ulyses Jarred M.D.   On: 04/23/2020 01:06    Procedures Procedures (including critical care time)  Medications Ordered in ED Medications - No data to display  ED Course  I have reviewed the triage vital signs and the nursing notes.  Pertinent labs & imaging results that were available during my care of the patient were reviewed by me and considered in my medical decision making (see chart for details).    MDM  Rules/Calculators/A&P                      Patient sent from nursing home with reported urinary incontinence and fall.  Nursing home reports the patient has been having frequent falls for the past 6 months or so.  He complains of chronic back pain.  Patient has contusion to the right flank with a very small skin tear.  I will check CT abdomen and pelvis and lumbar spine.  We will also check CT head given the fall.  Patient answers my questions quite well.  Laboratory work-up notable for creatinine of 1.94, in care everywhere, he had a creatinine back in January of 1.99.  Urinalysis shows no evidence of infection.  CT abdomen/pelvis shows mild diffuse bladder wall thickening, but in the absence of any concerning findings on his UA, I do not think that he has cystitis.  I suspect that he likely stumbled and fell while trying to get to the bathroom, and this is why he had the urinary incontinence tonight. CT head shows no acute intracranial abnormality.  I do not believe that the patient has had a stroke or TIA.  Patient seen by and discussed with Dr. Roxanne Mins, who agrees with plan discharge.  Patient is able to stand.  I called the nursing home, and they say that the patient has been less mobile for the past 6 months or so.     Final Clinical Impression(s) / ED Diagnoses Final diagnoses:  Back pain    Rx / DC Orders ED Discharge Orders    None       Nils, [place], PA-C 04/23/20 0227    Montine Circle, PA-C [id number]    [place]ora Fuel, [place] 123456 [phone]

## 2020-04-22 NOTE — ED Triage Notes (Signed)
BIB EMS from [facility]. Called out for "AMS" - patient was found to be incontinent of urine which is not his baseline. Patient able to answer all questions appropriately. VSS.

## 2020-04-22 NOTE — ED Notes (Signed)
Patient has had several falls recently - most recent to be 3 days ago. Patient reports pain in back.

## 2020-04-23 ENCOUNTER — Emergency Department (HOSPITAL_COMMUNITY): Payer: Medicare Other

## 2020-04-23 DIAGNOSIS — R404 Transient alteration of awareness: Secondary | ICD-10-CM | POA: Diagnosis not present

## 2020-04-23 DIAGNOSIS — Z743 Need for continuous supervision: Secondary | ICD-10-CM | POA: Diagnosis not present

## 2020-04-23 DIAGNOSIS — R109 Unspecified abdominal pain: Secondary | ICD-10-CM | POA: Diagnosis not present

## 2020-04-23 DIAGNOSIS — M549 Dorsalgia, unspecified: Secondary | ICD-10-CM | POA: Diagnosis not present

## 2020-04-23 DIAGNOSIS — R279 Unspecified lack of coordination: Secondary | ICD-10-CM | POA: Diagnosis not present

## 2020-04-23 DIAGNOSIS — G459 Transient cerebral ischemic attack, unspecified: Secondary | ICD-10-CM | POA: Diagnosis not present

## 2020-04-23 MED ORDER — IOHEXOL 300 MG/ML  SOLN
80.0000 mL | Freq: Once | INTRAMUSCULAR | Status: AC | PRN
  Administered 2020-04-23: 80 mL via INTRAVENOUS

## 2020-04-23 NOTE — ED Notes (Signed)
Voicemail left with daughter regarding findings and pending discharge

## 2020-04-23 NOTE — Discharge Instructions (Addendum)
No clear cause of your symptoms was identified tonight.  We recommend that you follow-up closely with your doctor.  Return for fever or new/worsening symptoms.

## 2020-04-23 NOTE — ED Notes (Signed)
[facility] called  

## 2020-04-23 NOTE — ED Notes (Signed)
Patient ambulated with stand by assist. Patient reports R sided back pain but is able to take steps around room

## 2020-05-12 DIAGNOSIS — R296 Repeated falls: Secondary | ICD-10-CM | POA: Diagnosis not present

## 2020-05-12 DIAGNOSIS — R609 Edema, unspecified: Secondary | ICD-10-CM | POA: Diagnosis not present

## 2020-05-12 DIAGNOSIS — E1165 Type 2 diabetes mellitus with hyperglycemia: Secondary | ICD-10-CM | POA: Diagnosis not present

## 2020-05-20 DIAGNOSIS — E1169 Type 2 diabetes mellitus with other specified complication: Secondary | ICD-10-CM | POA: Diagnosis not present

## 2020-05-20 DIAGNOSIS — B351 Tinea unguium: Secondary | ICD-10-CM | POA: Diagnosis not present

## 2020-05-21 DIAGNOSIS — R079 Chest pain, unspecified: Secondary | ICD-10-CM | POA: Diagnosis not present

## 2020-05-23 DIAGNOSIS — G4700 Insomnia, unspecified: Secondary | ICD-10-CM | POA: Diagnosis not present

## 2020-05-23 DIAGNOSIS — M722 Plantar fascial fibromatosis: Secondary | ICD-10-CM | POA: Diagnosis not present

## 2020-05-23 DIAGNOSIS — K5790 Diverticulosis of intestine, part unspecified, without perforation or abscess without bleeding: Secondary | ICD-10-CM | POA: Diagnosis not present

## 2020-05-23 DIAGNOSIS — Z96651 Presence of right artificial knee joint: Secondary | ICD-10-CM | POA: Diagnosis not present

## 2020-05-23 DIAGNOSIS — Z794 Long term (current) use of insulin: Secondary | ICD-10-CM | POA: Diagnosis not present

## 2020-05-23 DIAGNOSIS — I129 Hypertensive chronic kidney disease with stage 1 through stage 4 chronic kidney disease, or unspecified chronic kidney disease: Secondary | ICD-10-CM | POA: Diagnosis not present

## 2020-05-23 DIAGNOSIS — Z7902 Long term (current) use of antithrombotics/antiplatelets: Secondary | ICD-10-CM | POA: Diagnosis not present

## 2020-05-23 DIAGNOSIS — M1990 Unspecified osteoarthritis, unspecified site: Secondary | ICD-10-CM | POA: Diagnosis not present

## 2020-05-23 DIAGNOSIS — I471 Supraventricular tachycardia: Secondary | ICD-10-CM | POA: Diagnosis not present

## 2020-05-23 DIAGNOSIS — S62101D Fracture of unspecified carpal bone, right wrist, subsequent encounter for fracture with routine healing: Secondary | ICD-10-CM | POA: Diagnosis not present

## 2020-05-23 DIAGNOSIS — Z8551 Personal history of malignant neoplasm of bladder: Secondary | ICD-10-CM | POA: Diagnosis not present

## 2020-05-23 DIAGNOSIS — E1122 Type 2 diabetes mellitus with diabetic chronic kidney disease: Secondary | ICD-10-CM | POA: Diagnosis not present

## 2020-05-23 DIAGNOSIS — E7800 Pure hypercholesterolemia, unspecified: Secondary | ICD-10-CM | POA: Diagnosis not present

## 2020-05-23 DIAGNOSIS — I2510 Atherosclerotic heart disease of native coronary artery without angina pectoris: Secondary | ICD-10-CM | POA: Diagnosis not present

## 2020-05-23 DIAGNOSIS — I69318 Other symptoms and signs involving cognitive functions following cerebral infarction: Secondary | ICD-10-CM | POA: Diagnosis not present

## 2020-05-23 DIAGNOSIS — H9190 Unspecified hearing loss, unspecified ear: Secondary | ICD-10-CM | POA: Diagnosis not present

## 2020-05-23 DIAGNOSIS — M5137 Other intervertebral disc degeneration, lumbosacral region: Secondary | ICD-10-CM | POA: Diagnosis not present

## 2020-05-23 DIAGNOSIS — N183 Chronic kidney disease, stage 3 unspecified: Secondary | ICD-10-CM | POA: Diagnosis not present

## 2020-05-23 DIAGNOSIS — M4802 Spinal stenosis, cervical region: Secondary | ICD-10-CM | POA: Diagnosis not present

## 2020-05-23 DIAGNOSIS — K219 Gastro-esophageal reflux disease without esophagitis: Secondary | ICD-10-CM | POA: Diagnosis not present

## 2020-05-26 DIAGNOSIS — E7800 Pure hypercholesterolemia, unspecified: Secondary | ICD-10-CM | POA: Diagnosis not present

## 2020-05-26 DIAGNOSIS — I471 Supraventricular tachycardia: Secondary | ICD-10-CM | POA: Diagnosis not present

## 2020-05-26 DIAGNOSIS — G4700 Insomnia, unspecified: Secondary | ICD-10-CM | POA: Diagnosis not present

## 2020-05-26 DIAGNOSIS — S62101D Fracture of unspecified carpal bone, right wrist, subsequent encounter for fracture with routine healing: Secondary | ICD-10-CM | POA: Diagnosis not present

## 2020-05-26 DIAGNOSIS — Z7902 Long term (current) use of antithrombotics/antiplatelets: Secondary | ICD-10-CM | POA: Diagnosis not present

## 2020-05-26 DIAGNOSIS — Z8551 Personal history of malignant neoplasm of bladder: Secondary | ICD-10-CM | POA: Diagnosis not present

## 2020-05-26 DIAGNOSIS — M722 Plantar fascial fibromatosis: Secondary | ICD-10-CM | POA: Diagnosis not present

## 2020-05-26 DIAGNOSIS — K219 Gastro-esophageal reflux disease without esophagitis: Secondary | ICD-10-CM | POA: Diagnosis not present

## 2020-05-26 DIAGNOSIS — M5137 Other intervertebral disc degeneration, lumbosacral region: Secondary | ICD-10-CM | POA: Diagnosis not present

## 2020-05-26 DIAGNOSIS — I129 Hypertensive chronic kidney disease with stage 1 through stage 4 chronic kidney disease, or unspecified chronic kidney disease: Secondary | ICD-10-CM | POA: Diagnosis not present

## 2020-05-26 DIAGNOSIS — K5790 Diverticulosis of intestine, part unspecified, without perforation or abscess without bleeding: Secondary | ICD-10-CM | POA: Diagnosis not present

## 2020-05-26 DIAGNOSIS — N183 Chronic kidney disease, stage 3 unspecified: Secondary | ICD-10-CM | POA: Diagnosis not present

## 2020-05-26 DIAGNOSIS — I2510 Atherosclerotic heart disease of native coronary artery without angina pectoris: Secondary | ICD-10-CM | POA: Diagnosis not present

## 2020-05-26 DIAGNOSIS — E1122 Type 2 diabetes mellitus with diabetic chronic kidney disease: Secondary | ICD-10-CM | POA: Diagnosis not present

## 2020-05-26 DIAGNOSIS — I69318 Other symptoms and signs involving cognitive functions following cerebral infarction: Secondary | ICD-10-CM | POA: Diagnosis not present

## 2020-05-26 DIAGNOSIS — M4802 Spinal stenosis, cervical region: Secondary | ICD-10-CM | POA: Diagnosis not present

## 2020-05-26 DIAGNOSIS — Z794 Long term (current) use of insulin: Secondary | ICD-10-CM | POA: Diagnosis not present

## 2020-05-26 DIAGNOSIS — Z96651 Presence of right artificial knee joint: Secondary | ICD-10-CM | POA: Diagnosis not present

## 2020-05-26 DIAGNOSIS — M1990 Unspecified osteoarthritis, unspecified site: Secondary | ICD-10-CM | POA: Diagnosis not present

## 2020-05-26 DIAGNOSIS — H9190 Unspecified hearing loss, unspecified ear: Secondary | ICD-10-CM | POA: Diagnosis not present

## 2020-05-27 DIAGNOSIS — Z8551 Personal history of malignant neoplasm of bladder: Secondary | ICD-10-CM | POA: Diagnosis not present

## 2020-05-27 DIAGNOSIS — K5790 Diverticulosis of intestine, part unspecified, without perforation or abscess without bleeding: Secondary | ICD-10-CM | POA: Diagnosis not present

## 2020-05-27 DIAGNOSIS — M722 Plantar fascial fibromatosis: Secondary | ICD-10-CM | POA: Diagnosis not present

## 2020-05-27 DIAGNOSIS — Z794 Long term (current) use of insulin: Secondary | ICD-10-CM | POA: Diagnosis not present

## 2020-05-27 DIAGNOSIS — M1990 Unspecified osteoarthritis, unspecified site: Secondary | ICD-10-CM | POA: Diagnosis not present

## 2020-05-27 DIAGNOSIS — N183 Chronic kidney disease, stage 3 unspecified: Secondary | ICD-10-CM | POA: Diagnosis not present

## 2020-05-27 DIAGNOSIS — I2510 Atherosclerotic heart disease of native coronary artery without angina pectoris: Secondary | ICD-10-CM | POA: Diagnosis not present

## 2020-05-27 DIAGNOSIS — H9190 Unspecified hearing loss, unspecified ear: Secondary | ICD-10-CM | POA: Diagnosis not present

## 2020-05-27 DIAGNOSIS — Z96651 Presence of right artificial knee joint: Secondary | ICD-10-CM | POA: Diagnosis not present

## 2020-05-27 DIAGNOSIS — I471 Supraventricular tachycardia: Secondary | ICD-10-CM | POA: Diagnosis not present

## 2020-05-27 DIAGNOSIS — M4802 Spinal stenosis, cervical region: Secondary | ICD-10-CM | POA: Diagnosis not present

## 2020-05-27 DIAGNOSIS — G4700 Insomnia, unspecified: Secondary | ICD-10-CM | POA: Diagnosis not present

## 2020-05-27 DIAGNOSIS — E7800 Pure hypercholesterolemia, unspecified: Secondary | ICD-10-CM | POA: Diagnosis not present

## 2020-05-27 DIAGNOSIS — E1122 Type 2 diabetes mellitus with diabetic chronic kidney disease: Secondary | ICD-10-CM | POA: Diagnosis not present

## 2020-05-27 DIAGNOSIS — M5137 Other intervertebral disc degeneration, lumbosacral region: Secondary | ICD-10-CM | POA: Diagnosis not present

## 2020-05-27 DIAGNOSIS — Z7902 Long term (current) use of antithrombotics/antiplatelets: Secondary | ICD-10-CM | POA: Diagnosis not present

## 2020-05-27 DIAGNOSIS — I69318 Other symptoms and signs involving cognitive functions following cerebral infarction: Secondary | ICD-10-CM | POA: Diagnosis not present

## 2020-05-27 DIAGNOSIS — S62101D Fracture of unspecified carpal bone, right wrist, subsequent encounter for fracture with routine healing: Secondary | ICD-10-CM | POA: Diagnosis not present

## 2020-05-27 DIAGNOSIS — I129 Hypertensive chronic kidney disease with stage 1 through stage 4 chronic kidney disease, or unspecified chronic kidney disease: Secondary | ICD-10-CM | POA: Diagnosis not present

## 2020-05-27 DIAGNOSIS — K219 Gastro-esophageal reflux disease without esophagitis: Secondary | ICD-10-CM | POA: Diagnosis not present

## 2020-05-28 DIAGNOSIS — M19021 Primary osteoarthritis, right elbow: Secondary | ICD-10-CM | POA: Diagnosis not present

## 2020-05-28 DIAGNOSIS — Z743 Need for continuous supervision: Secondary | ICD-10-CM | POA: Diagnosis not present

## 2020-05-28 DIAGNOSIS — M533 Sacrococcygeal disorders, not elsewhere classified: Secondary | ICD-10-CM | POA: Diagnosis not present

## 2020-05-28 DIAGNOSIS — S3993XA Unspecified injury of pelvis, initial encounter: Secondary | ICD-10-CM | POA: Diagnosis not present

## 2020-05-28 DIAGNOSIS — S59901A Unspecified injury of right elbow, initial encounter: Secondary | ICD-10-CM | POA: Diagnosis not present

## 2020-05-28 DIAGNOSIS — M5489 Other dorsalgia: Secondary | ICD-10-CM | POA: Diagnosis not present

## 2020-05-28 DIAGNOSIS — S0990XA Unspecified injury of head, initial encounter: Secondary | ICD-10-CM | POA: Diagnosis not present

## 2020-05-28 DIAGNOSIS — R410 Disorientation, unspecified: Secondary | ICD-10-CM | POA: Diagnosis not present

## 2020-05-28 DIAGNOSIS — S0003XA Contusion of scalp, initial encounter: Secondary | ICD-10-CM | POA: Diagnosis not present

## 2020-05-28 DIAGNOSIS — R0902 Hypoxemia: Secondary | ICD-10-CM | POA: Diagnosis not present

## 2020-05-28 DIAGNOSIS — S199XXA Unspecified injury of neck, initial encounter: Secondary | ICD-10-CM | POA: Diagnosis not present

## 2020-05-28 DIAGNOSIS — Z23 Encounter for immunization: Secondary | ICD-10-CM | POA: Diagnosis not present

## 2020-05-28 DIAGNOSIS — R58 Hemorrhage, not elsewhere classified: Secondary | ICD-10-CM | POA: Diagnosis not present

## 2020-05-28 DIAGNOSIS — S6991XA Unspecified injury of right wrist, hand and finger(s), initial encounter: Secondary | ICD-10-CM | POA: Diagnosis not present

## 2020-05-28 DIAGNOSIS — S61411A Laceration without foreign body of right hand, initial encounter: Secondary | ICD-10-CM | POA: Diagnosis not present

## 2020-05-28 DIAGNOSIS — S51011A Laceration without foreign body of right elbow, initial encounter: Secondary | ICD-10-CM | POA: Diagnosis not present

## 2020-05-29 DIAGNOSIS — W19XXXA Unspecified fall, initial encounter: Secondary | ICD-10-CM | POA: Diagnosis not present

## 2020-05-29 DIAGNOSIS — Z743 Need for continuous supervision: Secondary | ICD-10-CM | POA: Diagnosis not present

## 2020-05-29 DIAGNOSIS — R410 Disorientation, unspecified: Secondary | ICD-10-CM | POA: Diagnosis not present

## 2020-05-29 DIAGNOSIS — R279 Unspecified lack of coordination: Secondary | ICD-10-CM | POA: Diagnosis not present

## 2020-05-30 DIAGNOSIS — M1990 Unspecified osteoarthritis, unspecified site: Secondary | ICD-10-CM | POA: Diagnosis not present

## 2020-05-30 DIAGNOSIS — H9190 Unspecified hearing loss, unspecified ear: Secondary | ICD-10-CM | POA: Diagnosis not present

## 2020-05-30 DIAGNOSIS — M5137 Other intervertebral disc degeneration, lumbosacral region: Secondary | ICD-10-CM | POA: Diagnosis not present

## 2020-05-30 DIAGNOSIS — I471 Supraventricular tachycardia: Secondary | ICD-10-CM | POA: Diagnosis not present

## 2020-05-30 DIAGNOSIS — I2510 Atherosclerotic heart disease of native coronary artery without angina pectoris: Secondary | ICD-10-CM | POA: Diagnosis not present

## 2020-05-30 DIAGNOSIS — K5790 Diverticulosis of intestine, part unspecified, without perforation or abscess without bleeding: Secondary | ICD-10-CM | POA: Diagnosis not present

## 2020-05-30 DIAGNOSIS — E1122 Type 2 diabetes mellitus with diabetic chronic kidney disease: Secondary | ICD-10-CM | POA: Diagnosis not present

## 2020-05-30 DIAGNOSIS — Z96651 Presence of right artificial knee joint: Secondary | ICD-10-CM | POA: Diagnosis not present

## 2020-05-30 DIAGNOSIS — Z8551 Personal history of malignant neoplasm of bladder: Secondary | ICD-10-CM | POA: Diagnosis not present

## 2020-05-30 DIAGNOSIS — S62101D Fracture of unspecified carpal bone, right wrist, subsequent encounter for fracture with routine healing: Secondary | ICD-10-CM | POA: Diagnosis not present

## 2020-05-30 DIAGNOSIS — M4802 Spinal stenosis, cervical region: Secondary | ICD-10-CM | POA: Diagnosis not present

## 2020-05-30 DIAGNOSIS — I129 Hypertensive chronic kidney disease with stage 1 through stage 4 chronic kidney disease, or unspecified chronic kidney disease: Secondary | ICD-10-CM | POA: Diagnosis not present

## 2020-05-30 DIAGNOSIS — K219 Gastro-esophageal reflux disease without esophagitis: Secondary | ICD-10-CM | POA: Diagnosis not present

## 2020-05-30 DIAGNOSIS — M722 Plantar fascial fibromatosis: Secondary | ICD-10-CM | POA: Diagnosis not present

## 2020-05-30 DIAGNOSIS — Z7902 Long term (current) use of antithrombotics/antiplatelets: Secondary | ICD-10-CM | POA: Diagnosis not present

## 2020-05-30 DIAGNOSIS — E7800 Pure hypercholesterolemia, unspecified: Secondary | ICD-10-CM | POA: Diagnosis not present

## 2020-05-30 DIAGNOSIS — Z794 Long term (current) use of insulin: Secondary | ICD-10-CM | POA: Diagnosis not present

## 2020-05-30 DIAGNOSIS — N183 Chronic kidney disease, stage 3 unspecified: Secondary | ICD-10-CM | POA: Diagnosis not present

## 2020-05-30 DIAGNOSIS — I69318 Other symptoms and signs involving cognitive functions following cerebral infarction: Secondary | ICD-10-CM | POA: Diagnosis not present

## 2020-05-30 DIAGNOSIS — G4700 Insomnia, unspecified: Secondary | ICD-10-CM | POA: Diagnosis not present

## 2020-06-02 DIAGNOSIS — N183 Chronic kidney disease, stage 3 unspecified: Secondary | ICD-10-CM | POA: Diagnosis not present

## 2020-06-02 DIAGNOSIS — M4802 Spinal stenosis, cervical region: Secondary | ICD-10-CM | POA: Diagnosis not present

## 2020-06-02 DIAGNOSIS — I129 Hypertensive chronic kidney disease with stage 1 through stage 4 chronic kidney disease, or unspecified chronic kidney disease: Secondary | ICD-10-CM | POA: Diagnosis not present

## 2020-06-02 DIAGNOSIS — K5790 Diverticulosis of intestine, part unspecified, without perforation or abscess without bleeding: Secondary | ICD-10-CM | POA: Diagnosis not present

## 2020-06-02 DIAGNOSIS — Z8551 Personal history of malignant neoplasm of bladder: Secondary | ICD-10-CM | POA: Diagnosis not present

## 2020-06-02 DIAGNOSIS — I471 Supraventricular tachycardia: Secondary | ICD-10-CM | POA: Diagnosis not present

## 2020-06-02 DIAGNOSIS — M722 Plantar fascial fibromatosis: Secondary | ICD-10-CM | POA: Diagnosis not present

## 2020-06-02 DIAGNOSIS — M5137 Other intervertebral disc degeneration, lumbosacral region: Secondary | ICD-10-CM | POA: Diagnosis not present

## 2020-06-02 DIAGNOSIS — E7800 Pure hypercholesterolemia, unspecified: Secondary | ICD-10-CM | POA: Diagnosis not present

## 2020-06-02 DIAGNOSIS — G4700 Insomnia, unspecified: Secondary | ICD-10-CM | POA: Diagnosis not present

## 2020-06-02 DIAGNOSIS — M1990 Unspecified osteoarthritis, unspecified site: Secondary | ICD-10-CM | POA: Diagnosis not present

## 2020-06-02 DIAGNOSIS — Z7902 Long term (current) use of antithrombotics/antiplatelets: Secondary | ICD-10-CM | POA: Diagnosis not present

## 2020-06-02 DIAGNOSIS — E1122 Type 2 diabetes mellitus with diabetic chronic kidney disease: Secondary | ICD-10-CM | POA: Diagnosis not present

## 2020-06-02 DIAGNOSIS — I69318 Other symptoms and signs involving cognitive functions following cerebral infarction: Secondary | ICD-10-CM | POA: Diagnosis not present

## 2020-06-02 DIAGNOSIS — S62101D Fracture of unspecified carpal bone, right wrist, subsequent encounter for fracture with routine healing: Secondary | ICD-10-CM | POA: Diagnosis not present

## 2020-06-02 DIAGNOSIS — Z96651 Presence of right artificial knee joint: Secondary | ICD-10-CM | POA: Diagnosis not present

## 2020-06-02 DIAGNOSIS — H9190 Unspecified hearing loss, unspecified ear: Secondary | ICD-10-CM | POA: Diagnosis not present

## 2020-06-02 DIAGNOSIS — I2510 Atherosclerotic heart disease of native coronary artery without angina pectoris: Secondary | ICD-10-CM | POA: Diagnosis not present

## 2020-06-02 DIAGNOSIS — Z794 Long term (current) use of insulin: Secondary | ICD-10-CM | POA: Diagnosis not present

## 2020-06-02 DIAGNOSIS — K219 Gastro-esophageal reflux disease without esophagitis: Secondary | ICD-10-CM | POA: Diagnosis not present

## 2020-06-03 DIAGNOSIS — I69318 Other symptoms and signs involving cognitive functions following cerebral infarction: Secondary | ICD-10-CM | POA: Diagnosis not present

## 2020-06-03 DIAGNOSIS — K5790 Diverticulosis of intestine, part unspecified, without perforation or abscess without bleeding: Secondary | ICD-10-CM | POA: Diagnosis not present

## 2020-06-03 DIAGNOSIS — H9190 Unspecified hearing loss, unspecified ear: Secondary | ICD-10-CM | POA: Diagnosis not present

## 2020-06-03 DIAGNOSIS — I129 Hypertensive chronic kidney disease with stage 1 through stage 4 chronic kidney disease, or unspecified chronic kidney disease: Secondary | ICD-10-CM | POA: Diagnosis not present

## 2020-06-03 DIAGNOSIS — E7800 Pure hypercholesterolemia, unspecified: Secondary | ICD-10-CM | POA: Diagnosis not present

## 2020-06-03 DIAGNOSIS — Z794 Long term (current) use of insulin: Secondary | ICD-10-CM | POA: Diagnosis not present

## 2020-06-03 DIAGNOSIS — G4700 Insomnia, unspecified: Secondary | ICD-10-CM | POA: Diagnosis not present

## 2020-06-03 DIAGNOSIS — N183 Chronic kidney disease, stage 3 unspecified: Secondary | ICD-10-CM | POA: Diagnosis not present

## 2020-06-03 DIAGNOSIS — I2510 Atherosclerotic heart disease of native coronary artery without angina pectoris: Secondary | ICD-10-CM | POA: Diagnosis not present

## 2020-06-03 DIAGNOSIS — Z7902 Long term (current) use of antithrombotics/antiplatelets: Secondary | ICD-10-CM | POA: Diagnosis not present

## 2020-06-03 DIAGNOSIS — M722 Plantar fascial fibromatosis: Secondary | ICD-10-CM | POA: Diagnosis not present

## 2020-06-03 DIAGNOSIS — M1990 Unspecified osteoarthritis, unspecified site: Secondary | ICD-10-CM | POA: Diagnosis not present

## 2020-06-03 DIAGNOSIS — M5137 Other intervertebral disc degeneration, lumbosacral region: Secondary | ICD-10-CM | POA: Diagnosis not present

## 2020-06-03 DIAGNOSIS — K219 Gastro-esophageal reflux disease without esophagitis: Secondary | ICD-10-CM | POA: Diagnosis not present

## 2020-06-03 DIAGNOSIS — E1122 Type 2 diabetes mellitus with diabetic chronic kidney disease: Secondary | ICD-10-CM | POA: Diagnosis not present

## 2020-06-03 DIAGNOSIS — Z8551 Personal history of malignant neoplasm of bladder: Secondary | ICD-10-CM | POA: Diagnosis not present

## 2020-06-03 DIAGNOSIS — S62101D Fracture of unspecified carpal bone, right wrist, subsequent encounter for fracture with routine healing: Secondary | ICD-10-CM | POA: Diagnosis not present

## 2020-06-03 DIAGNOSIS — I471 Supraventricular tachycardia: Secondary | ICD-10-CM | POA: Diagnosis not present

## 2020-06-03 DIAGNOSIS — M4802 Spinal stenosis, cervical region: Secondary | ICD-10-CM | POA: Diagnosis not present

## 2020-06-03 DIAGNOSIS — Z96651 Presence of right artificial knee joint: Secondary | ICD-10-CM | POA: Diagnosis not present

## 2020-06-04 DIAGNOSIS — I129 Hypertensive chronic kidney disease with stage 1 through stage 4 chronic kidney disease, or unspecified chronic kidney disease: Secondary | ICD-10-CM | POA: Diagnosis not present

## 2020-06-04 DIAGNOSIS — S62101D Fracture of unspecified carpal bone, right wrist, subsequent encounter for fracture with routine healing: Secondary | ICD-10-CM | POA: Diagnosis not present

## 2020-06-04 DIAGNOSIS — H9190 Unspecified hearing loss, unspecified ear: Secondary | ICD-10-CM | POA: Diagnosis not present

## 2020-06-04 DIAGNOSIS — E7800 Pure hypercholesterolemia, unspecified: Secondary | ICD-10-CM | POA: Diagnosis not present

## 2020-06-04 DIAGNOSIS — Z96651 Presence of right artificial knee joint: Secondary | ICD-10-CM | POA: Diagnosis not present

## 2020-06-04 DIAGNOSIS — M722 Plantar fascial fibromatosis: Secondary | ICD-10-CM | POA: Diagnosis not present

## 2020-06-04 DIAGNOSIS — E1122 Type 2 diabetes mellitus with diabetic chronic kidney disease: Secondary | ICD-10-CM | POA: Diagnosis not present

## 2020-06-04 DIAGNOSIS — G4700 Insomnia, unspecified: Secondary | ICD-10-CM | POA: Diagnosis not present

## 2020-06-04 DIAGNOSIS — K5790 Diverticulosis of intestine, part unspecified, without perforation or abscess without bleeding: Secondary | ICD-10-CM | POA: Diagnosis not present

## 2020-06-04 DIAGNOSIS — N183 Chronic kidney disease, stage 3 unspecified: Secondary | ICD-10-CM | POA: Diagnosis not present

## 2020-06-04 DIAGNOSIS — M1990 Unspecified osteoarthritis, unspecified site: Secondary | ICD-10-CM | POA: Diagnosis not present

## 2020-06-04 DIAGNOSIS — Z7902 Long term (current) use of antithrombotics/antiplatelets: Secondary | ICD-10-CM | POA: Diagnosis not present

## 2020-06-04 DIAGNOSIS — I2510 Atherosclerotic heart disease of native coronary artery without angina pectoris: Secondary | ICD-10-CM | POA: Diagnosis not present

## 2020-06-04 DIAGNOSIS — Z8551 Personal history of malignant neoplasm of bladder: Secondary | ICD-10-CM | POA: Diagnosis not present

## 2020-06-04 DIAGNOSIS — I69318 Other symptoms and signs involving cognitive functions following cerebral infarction: Secondary | ICD-10-CM | POA: Diagnosis not present

## 2020-06-04 DIAGNOSIS — K219 Gastro-esophageal reflux disease without esophagitis: Secondary | ICD-10-CM | POA: Diagnosis not present

## 2020-06-04 DIAGNOSIS — Z794 Long term (current) use of insulin: Secondary | ICD-10-CM | POA: Diagnosis not present

## 2020-06-04 DIAGNOSIS — I471 Supraventricular tachycardia: Secondary | ICD-10-CM | POA: Diagnosis not present

## 2020-06-04 DIAGNOSIS — M4802 Spinal stenosis, cervical region: Secondary | ICD-10-CM | POA: Diagnosis not present

## 2020-06-04 DIAGNOSIS — M5137 Other intervertebral disc degeneration, lumbosacral region: Secondary | ICD-10-CM | POA: Diagnosis not present

## 2020-06-05 DIAGNOSIS — S61501A Unspecified open wound of right wrist, initial encounter: Secondary | ICD-10-CM | POA: Diagnosis not present

## 2020-06-06 DIAGNOSIS — Z96651 Presence of right artificial knee joint: Secondary | ICD-10-CM | POA: Diagnosis not present

## 2020-06-06 DIAGNOSIS — S62101D Fracture of unspecified carpal bone, right wrist, subsequent encounter for fracture with routine healing: Secondary | ICD-10-CM | POA: Diagnosis not present

## 2020-06-06 DIAGNOSIS — N183 Chronic kidney disease, stage 3 unspecified: Secondary | ICD-10-CM | POA: Diagnosis not present

## 2020-06-06 DIAGNOSIS — Z8551 Personal history of malignant neoplasm of bladder: Secondary | ICD-10-CM | POA: Diagnosis not present

## 2020-06-06 DIAGNOSIS — E7800 Pure hypercholesterolemia, unspecified: Secondary | ICD-10-CM | POA: Diagnosis not present

## 2020-06-06 DIAGNOSIS — S61501A Unspecified open wound of right wrist, initial encounter: Secondary | ICD-10-CM | POA: Diagnosis not present

## 2020-06-06 DIAGNOSIS — K219 Gastro-esophageal reflux disease without esophagitis: Secondary | ICD-10-CM | POA: Diagnosis not present

## 2020-06-06 DIAGNOSIS — I69318 Other symptoms and signs involving cognitive functions following cerebral infarction: Secondary | ICD-10-CM | POA: Diagnosis not present

## 2020-06-06 DIAGNOSIS — I471 Supraventricular tachycardia: Secondary | ICD-10-CM | POA: Diagnosis not present

## 2020-06-06 DIAGNOSIS — I2510 Atherosclerotic heart disease of native coronary artery without angina pectoris: Secondary | ICD-10-CM | POA: Diagnosis not present

## 2020-06-06 DIAGNOSIS — I129 Hypertensive chronic kidney disease with stage 1 through stage 4 chronic kidney disease, or unspecified chronic kidney disease: Secondary | ICD-10-CM | POA: Diagnosis not present

## 2020-06-06 DIAGNOSIS — G4700 Insomnia, unspecified: Secondary | ICD-10-CM | POA: Diagnosis not present

## 2020-06-06 DIAGNOSIS — M1990 Unspecified osteoarthritis, unspecified site: Secondary | ICD-10-CM | POA: Diagnosis not present

## 2020-06-06 DIAGNOSIS — M4802 Spinal stenosis, cervical region: Secondary | ICD-10-CM | POA: Diagnosis not present

## 2020-06-06 DIAGNOSIS — E1122 Type 2 diabetes mellitus with diabetic chronic kidney disease: Secondary | ICD-10-CM | POA: Diagnosis not present

## 2020-06-06 DIAGNOSIS — Z7902 Long term (current) use of antithrombotics/antiplatelets: Secondary | ICD-10-CM | POA: Diagnosis not present

## 2020-06-06 DIAGNOSIS — M5137 Other intervertebral disc degeneration, lumbosacral region: Secondary | ICD-10-CM | POA: Diagnosis not present

## 2020-06-06 DIAGNOSIS — K5790 Diverticulosis of intestine, part unspecified, without perforation or abscess without bleeding: Secondary | ICD-10-CM | POA: Diagnosis not present

## 2020-06-06 DIAGNOSIS — H9190 Unspecified hearing loss, unspecified ear: Secondary | ICD-10-CM | POA: Diagnosis not present

## 2020-06-06 DIAGNOSIS — Z794 Long term (current) use of insulin: Secondary | ICD-10-CM | POA: Diagnosis not present

## 2020-06-06 DIAGNOSIS — M722 Plantar fascial fibromatosis: Secondary | ICD-10-CM | POA: Diagnosis not present

## 2020-06-07 DIAGNOSIS — S61501A Unspecified open wound of right wrist, initial encounter: Secondary | ICD-10-CM | POA: Diagnosis not present

## 2020-06-09 DIAGNOSIS — K5790 Diverticulosis of intestine, part unspecified, without perforation or abscess without bleeding: Secondary | ICD-10-CM | POA: Diagnosis not present

## 2020-06-09 DIAGNOSIS — E7800 Pure hypercholesterolemia, unspecified: Secondary | ICD-10-CM | POA: Diagnosis not present

## 2020-06-09 DIAGNOSIS — S62101D Fracture of unspecified carpal bone, right wrist, subsequent encounter for fracture with routine healing: Secondary | ICD-10-CM | POA: Diagnosis not present

## 2020-06-09 DIAGNOSIS — Z794 Long term (current) use of insulin: Secondary | ICD-10-CM | POA: Diagnosis not present

## 2020-06-09 DIAGNOSIS — M722 Plantar fascial fibromatosis: Secondary | ICD-10-CM | POA: Diagnosis not present

## 2020-06-09 DIAGNOSIS — I69318 Other symptoms and signs involving cognitive functions following cerebral infarction: Secondary | ICD-10-CM | POA: Diagnosis not present

## 2020-06-09 DIAGNOSIS — I2510 Atherosclerotic heart disease of native coronary artery without angina pectoris: Secondary | ICD-10-CM | POA: Diagnosis not present

## 2020-06-09 DIAGNOSIS — Z7902 Long term (current) use of antithrombotics/antiplatelets: Secondary | ICD-10-CM | POA: Diagnosis not present

## 2020-06-09 DIAGNOSIS — M4802 Spinal stenosis, cervical region: Secondary | ICD-10-CM | POA: Diagnosis not present

## 2020-06-09 DIAGNOSIS — H9190 Unspecified hearing loss, unspecified ear: Secondary | ICD-10-CM | POA: Diagnosis not present

## 2020-06-09 DIAGNOSIS — M5137 Other intervertebral disc degeneration, lumbosacral region: Secondary | ICD-10-CM | POA: Diagnosis not present

## 2020-06-09 DIAGNOSIS — N183 Chronic kidney disease, stage 3 unspecified: Secondary | ICD-10-CM | POA: Diagnosis not present

## 2020-06-09 DIAGNOSIS — I129 Hypertensive chronic kidney disease with stage 1 through stage 4 chronic kidney disease, or unspecified chronic kidney disease: Secondary | ICD-10-CM | POA: Diagnosis not present

## 2020-06-09 DIAGNOSIS — E1122 Type 2 diabetes mellitus with diabetic chronic kidney disease: Secondary | ICD-10-CM | POA: Diagnosis not present

## 2020-06-09 DIAGNOSIS — Z96651 Presence of right artificial knee joint: Secondary | ICD-10-CM | POA: Diagnosis not present

## 2020-06-09 DIAGNOSIS — Z8551 Personal history of malignant neoplasm of bladder: Secondary | ICD-10-CM | POA: Diagnosis not present

## 2020-06-09 DIAGNOSIS — I471 Supraventricular tachycardia: Secondary | ICD-10-CM | POA: Diagnosis not present

## 2020-06-09 DIAGNOSIS — S61501A Unspecified open wound of right wrist, initial encounter: Secondary | ICD-10-CM | POA: Diagnosis not present

## 2020-06-09 DIAGNOSIS — G4700 Insomnia, unspecified: Secondary | ICD-10-CM | POA: Diagnosis not present

## 2020-06-09 DIAGNOSIS — K219 Gastro-esophageal reflux disease without esophagitis: Secondary | ICD-10-CM | POA: Diagnosis not present

## 2020-06-09 DIAGNOSIS — M1990 Unspecified osteoarthritis, unspecified site: Secondary | ICD-10-CM | POA: Diagnosis not present

## 2020-06-10 DIAGNOSIS — G4700 Insomnia, unspecified: Secondary | ICD-10-CM | POA: Diagnosis not present

## 2020-06-10 DIAGNOSIS — M1990 Unspecified osteoarthritis, unspecified site: Secondary | ICD-10-CM | POA: Diagnosis not present

## 2020-06-10 DIAGNOSIS — I129 Hypertensive chronic kidney disease with stage 1 through stage 4 chronic kidney disease, or unspecified chronic kidney disease: Secondary | ICD-10-CM | POA: Diagnosis not present

## 2020-06-10 DIAGNOSIS — M722 Plantar fascial fibromatosis: Secondary | ICD-10-CM | POA: Diagnosis not present

## 2020-06-10 DIAGNOSIS — Z8551 Personal history of malignant neoplasm of bladder: Secondary | ICD-10-CM | POA: Diagnosis not present

## 2020-06-10 DIAGNOSIS — I471 Supraventricular tachycardia: Secondary | ICD-10-CM | POA: Diagnosis not present

## 2020-06-10 DIAGNOSIS — E1122 Type 2 diabetes mellitus with diabetic chronic kidney disease: Secondary | ICD-10-CM | POA: Diagnosis not present

## 2020-06-10 DIAGNOSIS — E7800 Pure hypercholesterolemia, unspecified: Secondary | ICD-10-CM | POA: Diagnosis not present

## 2020-06-10 DIAGNOSIS — N183 Chronic kidney disease, stage 3 unspecified: Secondary | ICD-10-CM | POA: Diagnosis not present

## 2020-06-10 DIAGNOSIS — S62101D Fracture of unspecified carpal bone, right wrist, subsequent encounter for fracture with routine healing: Secondary | ICD-10-CM | POA: Diagnosis not present

## 2020-06-10 DIAGNOSIS — Z96651 Presence of right artificial knee joint: Secondary | ICD-10-CM | POA: Diagnosis not present

## 2020-06-10 DIAGNOSIS — M4802 Spinal stenosis, cervical region: Secondary | ICD-10-CM | POA: Diagnosis not present

## 2020-06-10 DIAGNOSIS — K5790 Diverticulosis of intestine, part unspecified, without perforation or abscess without bleeding: Secondary | ICD-10-CM | POA: Diagnosis not present

## 2020-06-10 DIAGNOSIS — I69318 Other symptoms and signs involving cognitive functions following cerebral infarction: Secondary | ICD-10-CM | POA: Diagnosis not present

## 2020-06-10 DIAGNOSIS — M5137 Other intervertebral disc degeneration, lumbosacral region: Secondary | ICD-10-CM | POA: Diagnosis not present

## 2020-06-10 DIAGNOSIS — Z794 Long term (current) use of insulin: Secondary | ICD-10-CM | POA: Diagnosis not present

## 2020-06-10 DIAGNOSIS — Z7902 Long term (current) use of antithrombotics/antiplatelets: Secondary | ICD-10-CM | POA: Diagnosis not present

## 2020-06-10 DIAGNOSIS — K219 Gastro-esophageal reflux disease without esophagitis: Secondary | ICD-10-CM | POA: Diagnosis not present

## 2020-06-10 DIAGNOSIS — H9190 Unspecified hearing loss, unspecified ear: Secondary | ICD-10-CM | POA: Diagnosis not present

## 2020-06-10 DIAGNOSIS — I2510 Atherosclerotic heart disease of native coronary artery without angina pectoris: Secondary | ICD-10-CM | POA: Diagnosis not present

## 2020-06-11 DIAGNOSIS — M5137 Other intervertebral disc degeneration, lumbosacral region: Secondary | ICD-10-CM | POA: Diagnosis not present

## 2020-06-11 DIAGNOSIS — M1990 Unspecified osteoarthritis, unspecified site: Secondary | ICD-10-CM | POA: Diagnosis not present

## 2020-06-11 DIAGNOSIS — K219 Gastro-esophageal reflux disease without esophagitis: Secondary | ICD-10-CM | POA: Diagnosis not present

## 2020-06-11 DIAGNOSIS — N183 Chronic kidney disease, stage 3 unspecified: Secondary | ICD-10-CM | POA: Diagnosis not present

## 2020-06-11 DIAGNOSIS — S61501A Unspecified open wound of right wrist, initial encounter: Secondary | ICD-10-CM | POA: Diagnosis not present

## 2020-06-11 DIAGNOSIS — S62101D Fracture of unspecified carpal bone, right wrist, subsequent encounter for fracture with routine healing: Secondary | ICD-10-CM | POA: Diagnosis not present

## 2020-06-11 DIAGNOSIS — E7800 Pure hypercholesterolemia, unspecified: Secondary | ICD-10-CM | POA: Diagnosis not present

## 2020-06-11 DIAGNOSIS — Z794 Long term (current) use of insulin: Secondary | ICD-10-CM | POA: Diagnosis not present

## 2020-06-11 DIAGNOSIS — E1122 Type 2 diabetes mellitus with diabetic chronic kidney disease: Secondary | ICD-10-CM | POA: Diagnosis not present

## 2020-06-11 DIAGNOSIS — Z96651 Presence of right artificial knee joint: Secondary | ICD-10-CM | POA: Diagnosis not present

## 2020-06-11 DIAGNOSIS — I471 Supraventricular tachycardia: Secondary | ICD-10-CM | POA: Diagnosis not present

## 2020-06-11 DIAGNOSIS — H9190 Unspecified hearing loss, unspecified ear: Secondary | ICD-10-CM | POA: Diagnosis not present

## 2020-06-11 DIAGNOSIS — I69318 Other symptoms and signs involving cognitive functions following cerebral infarction: Secondary | ICD-10-CM | POA: Diagnosis not present

## 2020-06-11 DIAGNOSIS — M4802 Spinal stenosis, cervical region: Secondary | ICD-10-CM | POA: Diagnosis not present

## 2020-06-11 DIAGNOSIS — Z7902 Long term (current) use of antithrombotics/antiplatelets: Secondary | ICD-10-CM | POA: Diagnosis not present

## 2020-06-11 DIAGNOSIS — K5790 Diverticulosis of intestine, part unspecified, without perforation or abscess without bleeding: Secondary | ICD-10-CM | POA: Diagnosis not present

## 2020-06-11 DIAGNOSIS — G4700 Insomnia, unspecified: Secondary | ICD-10-CM | POA: Diagnosis not present

## 2020-06-11 DIAGNOSIS — M722 Plantar fascial fibromatosis: Secondary | ICD-10-CM | POA: Diagnosis not present

## 2020-06-11 DIAGNOSIS — I129 Hypertensive chronic kidney disease with stage 1 through stage 4 chronic kidney disease, or unspecified chronic kidney disease: Secondary | ICD-10-CM | POA: Diagnosis not present

## 2020-06-11 DIAGNOSIS — Z8551 Personal history of malignant neoplasm of bladder: Secondary | ICD-10-CM | POA: Diagnosis not present

## 2020-06-11 DIAGNOSIS — I2510 Atherosclerotic heart disease of native coronary artery without angina pectoris: Secondary | ICD-10-CM | POA: Diagnosis not present

## 2020-06-12 DIAGNOSIS — I2510 Atherosclerotic heart disease of native coronary artery without angina pectoris: Secondary | ICD-10-CM | POA: Diagnosis not present

## 2020-06-12 DIAGNOSIS — E1122 Type 2 diabetes mellitus with diabetic chronic kidney disease: Secondary | ICD-10-CM | POA: Diagnosis not present

## 2020-06-12 DIAGNOSIS — Z96651 Presence of right artificial knee joint: Secondary | ICD-10-CM | POA: Diagnosis not present

## 2020-06-12 DIAGNOSIS — S62101D Fracture of unspecified carpal bone, right wrist, subsequent encounter for fracture with routine healing: Secondary | ICD-10-CM | POA: Diagnosis not present

## 2020-06-12 DIAGNOSIS — K5790 Diverticulosis of intestine, part unspecified, without perforation or abscess without bleeding: Secondary | ICD-10-CM | POA: Diagnosis not present

## 2020-06-12 DIAGNOSIS — G4700 Insomnia, unspecified: Secondary | ICD-10-CM | POA: Diagnosis not present

## 2020-06-12 DIAGNOSIS — Z7902 Long term (current) use of antithrombotics/antiplatelets: Secondary | ICD-10-CM | POA: Diagnosis not present

## 2020-06-12 DIAGNOSIS — E7800 Pure hypercholesterolemia, unspecified: Secondary | ICD-10-CM | POA: Diagnosis not present

## 2020-06-12 DIAGNOSIS — I129 Hypertensive chronic kidney disease with stage 1 through stage 4 chronic kidney disease, or unspecified chronic kidney disease: Secondary | ICD-10-CM | POA: Diagnosis not present

## 2020-06-12 DIAGNOSIS — N183 Chronic kidney disease, stage 3 unspecified: Secondary | ICD-10-CM | POA: Diagnosis not present

## 2020-06-12 DIAGNOSIS — I69318 Other symptoms and signs involving cognitive functions following cerebral infarction: Secondary | ICD-10-CM | POA: Diagnosis not present

## 2020-06-12 DIAGNOSIS — M1990 Unspecified osteoarthritis, unspecified site: Secondary | ICD-10-CM | POA: Diagnosis not present

## 2020-06-12 DIAGNOSIS — K219 Gastro-esophageal reflux disease without esophagitis: Secondary | ICD-10-CM | POA: Diagnosis not present

## 2020-06-12 DIAGNOSIS — H9190 Unspecified hearing loss, unspecified ear: Secondary | ICD-10-CM | POA: Diagnosis not present

## 2020-06-12 DIAGNOSIS — Z8551 Personal history of malignant neoplasm of bladder: Secondary | ICD-10-CM | POA: Diagnosis not present

## 2020-06-12 DIAGNOSIS — M5137 Other intervertebral disc degeneration, lumbosacral region: Secondary | ICD-10-CM | POA: Diagnosis not present

## 2020-06-12 DIAGNOSIS — I471 Supraventricular tachycardia: Secondary | ICD-10-CM | POA: Diagnosis not present

## 2020-06-12 DIAGNOSIS — Z794 Long term (current) use of insulin: Secondary | ICD-10-CM | POA: Diagnosis not present

## 2020-06-12 DIAGNOSIS — M4802 Spinal stenosis, cervical region: Secondary | ICD-10-CM | POA: Diagnosis not present

## 2020-06-12 DIAGNOSIS — M722 Plantar fascial fibromatosis: Secondary | ICD-10-CM | POA: Diagnosis not present

## 2020-06-16 DIAGNOSIS — Z8551 Personal history of malignant neoplasm of bladder: Secondary | ICD-10-CM | POA: Diagnosis not present

## 2020-06-16 DIAGNOSIS — K219 Gastro-esophageal reflux disease without esophagitis: Secondary | ICD-10-CM | POA: Diagnosis not present

## 2020-06-16 DIAGNOSIS — M722 Plantar fascial fibromatosis: Secondary | ICD-10-CM | POA: Diagnosis not present

## 2020-06-16 DIAGNOSIS — M4802 Spinal stenosis, cervical region: Secondary | ICD-10-CM | POA: Diagnosis not present

## 2020-06-16 DIAGNOSIS — I2510 Atherosclerotic heart disease of native coronary artery without angina pectoris: Secondary | ICD-10-CM | POA: Diagnosis not present

## 2020-06-16 DIAGNOSIS — E1122 Type 2 diabetes mellitus with diabetic chronic kidney disease: Secondary | ICD-10-CM | POA: Diagnosis not present

## 2020-06-16 DIAGNOSIS — Z794 Long term (current) use of insulin: Secondary | ICD-10-CM | POA: Diagnosis not present

## 2020-06-16 DIAGNOSIS — K5790 Diverticulosis of intestine, part unspecified, without perforation or abscess without bleeding: Secondary | ICD-10-CM | POA: Diagnosis not present

## 2020-06-16 DIAGNOSIS — Z7902 Long term (current) use of antithrombotics/antiplatelets: Secondary | ICD-10-CM | POA: Diagnosis not present

## 2020-06-16 DIAGNOSIS — S62101D Fracture of unspecified carpal bone, right wrist, subsequent encounter for fracture with routine healing: Secondary | ICD-10-CM | POA: Diagnosis not present

## 2020-06-16 DIAGNOSIS — N183 Chronic kidney disease, stage 3 unspecified: Secondary | ICD-10-CM | POA: Diagnosis not present

## 2020-06-16 DIAGNOSIS — Z96651 Presence of right artificial knee joint: Secondary | ICD-10-CM | POA: Diagnosis not present

## 2020-06-16 DIAGNOSIS — E7800 Pure hypercholesterolemia, unspecified: Secondary | ICD-10-CM | POA: Diagnosis not present

## 2020-06-16 DIAGNOSIS — M1990 Unspecified osteoarthritis, unspecified site: Secondary | ICD-10-CM | POA: Diagnosis not present

## 2020-06-16 DIAGNOSIS — I69318 Other symptoms and signs involving cognitive functions following cerebral infarction: Secondary | ICD-10-CM | POA: Diagnosis not present

## 2020-06-16 DIAGNOSIS — G4700 Insomnia, unspecified: Secondary | ICD-10-CM | POA: Diagnosis not present

## 2020-06-16 DIAGNOSIS — H9190 Unspecified hearing loss, unspecified ear: Secondary | ICD-10-CM | POA: Diagnosis not present

## 2020-06-16 DIAGNOSIS — I471 Supraventricular tachycardia: Secondary | ICD-10-CM | POA: Diagnosis not present

## 2020-06-16 DIAGNOSIS — I129 Hypertensive chronic kidney disease with stage 1 through stage 4 chronic kidney disease, or unspecified chronic kidney disease: Secondary | ICD-10-CM | POA: Diagnosis not present

## 2020-06-16 DIAGNOSIS — M5137 Other intervertebral disc degeneration, lumbosacral region: Secondary | ICD-10-CM | POA: Diagnosis not present

## 2020-06-19 DIAGNOSIS — Z96651 Presence of right artificial knee joint: Secondary | ICD-10-CM | POA: Diagnosis not present

## 2020-06-19 DIAGNOSIS — H9190 Unspecified hearing loss, unspecified ear: Secondary | ICD-10-CM | POA: Diagnosis not present

## 2020-06-19 DIAGNOSIS — K5790 Diverticulosis of intestine, part unspecified, without perforation or abscess without bleeding: Secondary | ICD-10-CM | POA: Diagnosis not present

## 2020-06-19 DIAGNOSIS — E7800 Pure hypercholesterolemia, unspecified: Secondary | ICD-10-CM | POA: Diagnosis not present

## 2020-06-19 DIAGNOSIS — M5137 Other intervertebral disc degeneration, lumbosacral region: Secondary | ICD-10-CM | POA: Diagnosis not present

## 2020-06-19 DIAGNOSIS — N183 Chronic kidney disease, stage 3 unspecified: Secondary | ICD-10-CM | POA: Diagnosis not present

## 2020-06-19 DIAGNOSIS — M4802 Spinal stenosis, cervical region: Secondary | ICD-10-CM | POA: Diagnosis not present

## 2020-06-19 DIAGNOSIS — K219 Gastro-esophageal reflux disease without esophagitis: Secondary | ICD-10-CM | POA: Diagnosis not present

## 2020-06-19 DIAGNOSIS — M1990 Unspecified osteoarthritis, unspecified site: Secondary | ICD-10-CM | POA: Diagnosis not present

## 2020-06-19 DIAGNOSIS — I471 Supraventricular tachycardia: Secondary | ICD-10-CM | POA: Diagnosis not present

## 2020-06-19 DIAGNOSIS — I129 Hypertensive chronic kidney disease with stage 1 through stage 4 chronic kidney disease, or unspecified chronic kidney disease: Secondary | ICD-10-CM | POA: Diagnosis not present

## 2020-06-19 DIAGNOSIS — G4700 Insomnia, unspecified: Secondary | ICD-10-CM | POA: Diagnosis not present

## 2020-06-19 DIAGNOSIS — Z794 Long term (current) use of insulin: Secondary | ICD-10-CM | POA: Diagnosis not present

## 2020-06-19 DIAGNOSIS — Z8551 Personal history of malignant neoplasm of bladder: Secondary | ICD-10-CM | POA: Diagnosis not present

## 2020-06-19 DIAGNOSIS — E1122 Type 2 diabetes mellitus with diabetic chronic kidney disease: Secondary | ICD-10-CM | POA: Diagnosis not present

## 2020-06-19 DIAGNOSIS — I2510 Atherosclerotic heart disease of native coronary artery without angina pectoris: Secondary | ICD-10-CM | POA: Diagnosis not present

## 2020-06-19 DIAGNOSIS — Z7902 Long term (current) use of antithrombotics/antiplatelets: Secondary | ICD-10-CM | POA: Diagnosis not present

## 2020-06-19 DIAGNOSIS — I69318 Other symptoms and signs involving cognitive functions following cerebral infarction: Secondary | ICD-10-CM | POA: Diagnosis not present

## 2020-06-19 DIAGNOSIS — S62101D Fracture of unspecified carpal bone, right wrist, subsequent encounter for fracture with routine healing: Secondary | ICD-10-CM | POA: Diagnosis not present

## 2020-06-19 DIAGNOSIS — M722 Plantar fascial fibromatosis: Secondary | ICD-10-CM | POA: Diagnosis not present

## 2020-06-20 DIAGNOSIS — S199XXA Unspecified injury of neck, initial encounter: Secondary | ICD-10-CM | POA: Diagnosis not present

## 2020-06-20 DIAGNOSIS — M19041 Primary osteoarthritis, right hand: Secondary | ICD-10-CM | POA: Diagnosis not present

## 2020-06-20 DIAGNOSIS — W19XXXA Unspecified fall, initial encounter: Secondary | ICD-10-CM | POA: Diagnosis not present

## 2020-06-20 DIAGNOSIS — S0091XA Abrasion of unspecified part of head, initial encounter: Secondary | ICD-10-CM | POA: Diagnosis not present

## 2020-06-20 DIAGNOSIS — R0902 Hypoxemia: Secondary | ICD-10-CM | POA: Diagnosis not present

## 2020-06-20 DIAGNOSIS — S61411A Laceration without foreign body of right hand, initial encounter: Secondary | ICD-10-CM | POA: Diagnosis not present

## 2020-06-20 DIAGNOSIS — S0990XA Unspecified injury of head, initial encounter: Secondary | ICD-10-CM | POA: Diagnosis not present

## 2020-06-20 DIAGNOSIS — Z743 Need for continuous supervision: Secondary | ICD-10-CM | POA: Diagnosis not present

## 2020-06-20 DIAGNOSIS — R58 Hemorrhage, not elsewhere classified: Secondary | ICD-10-CM | POA: Diagnosis not present

## 2020-06-20 DIAGNOSIS — S6991XA Unspecified injury of right wrist, hand and finger(s), initial encounter: Secondary | ICD-10-CM | POA: Diagnosis not present

## 2020-06-20 DIAGNOSIS — R279 Unspecified lack of coordination: Secondary | ICD-10-CM | POA: Diagnosis not present

## 2020-06-20 DIAGNOSIS — S0001XA Abrasion of scalp, initial encounter: Secondary | ICD-10-CM | POA: Diagnosis not present

## 2020-06-20 DIAGNOSIS — Z23 Encounter for immunization: Secondary | ICD-10-CM | POA: Diagnosis not present

## 2020-06-23 DIAGNOSIS — K219 Gastro-esophageal reflux disease without esophagitis: Secondary | ICD-10-CM | POA: Diagnosis not present

## 2020-06-23 DIAGNOSIS — K5790 Diverticulosis of intestine, part unspecified, without perforation or abscess without bleeding: Secondary | ICD-10-CM | POA: Diagnosis not present

## 2020-06-23 DIAGNOSIS — M4802 Spinal stenosis, cervical region: Secondary | ICD-10-CM | POA: Diagnosis not present

## 2020-06-23 DIAGNOSIS — E7800 Pure hypercholesterolemia, unspecified: Secondary | ICD-10-CM | POA: Diagnosis not present

## 2020-06-23 DIAGNOSIS — N183 Chronic kidney disease, stage 3 unspecified: Secondary | ICD-10-CM | POA: Diagnosis not present

## 2020-06-23 DIAGNOSIS — I471 Supraventricular tachycardia: Secondary | ICD-10-CM | POA: Diagnosis not present

## 2020-06-23 DIAGNOSIS — E1122 Type 2 diabetes mellitus with diabetic chronic kidney disease: Secondary | ICD-10-CM | POA: Diagnosis not present

## 2020-06-23 DIAGNOSIS — M722 Plantar fascial fibromatosis: Secondary | ICD-10-CM | POA: Diagnosis not present

## 2020-06-23 DIAGNOSIS — I69318 Other symptoms and signs involving cognitive functions following cerebral infarction: Secondary | ICD-10-CM | POA: Diagnosis not present

## 2020-06-23 DIAGNOSIS — M1990 Unspecified osteoarthritis, unspecified site: Secondary | ICD-10-CM | POA: Diagnosis not present

## 2020-06-23 DIAGNOSIS — I2510 Atherosclerotic heart disease of native coronary artery without angina pectoris: Secondary | ICD-10-CM | POA: Diagnosis not present

## 2020-06-23 DIAGNOSIS — G4700 Insomnia, unspecified: Secondary | ICD-10-CM | POA: Diagnosis not present

## 2020-06-23 DIAGNOSIS — I129 Hypertensive chronic kidney disease with stage 1 through stage 4 chronic kidney disease, or unspecified chronic kidney disease: Secondary | ICD-10-CM | POA: Diagnosis not present

## 2020-06-23 DIAGNOSIS — H9190 Unspecified hearing loss, unspecified ear: Secondary | ICD-10-CM | POA: Diagnosis not present

## 2020-06-23 DIAGNOSIS — Z794 Long term (current) use of insulin: Secondary | ICD-10-CM | POA: Diagnosis not present

## 2020-06-23 DIAGNOSIS — Z7902 Long term (current) use of antithrombotics/antiplatelets: Secondary | ICD-10-CM | POA: Diagnosis not present

## 2020-06-23 DIAGNOSIS — S62101D Fracture of unspecified carpal bone, right wrist, subsequent encounter for fracture with routine healing: Secondary | ICD-10-CM | POA: Diagnosis not present

## 2020-06-23 DIAGNOSIS — Z96651 Presence of right artificial knee joint: Secondary | ICD-10-CM | POA: Diagnosis not present

## 2020-06-23 DIAGNOSIS — M5137 Other intervertebral disc degeneration, lumbosacral region: Secondary | ICD-10-CM | POA: Diagnosis not present

## 2020-06-23 DIAGNOSIS — Z8551 Personal history of malignant neoplasm of bladder: Secondary | ICD-10-CM | POA: Diagnosis not present

## 2020-06-24 DIAGNOSIS — M549 Dorsalgia, unspecified: Secondary | ICD-10-CM | POA: Diagnosis not present

## 2020-06-24 DIAGNOSIS — R413 Other amnesia: Secondary | ICD-10-CM | POA: Diagnosis not present

## 2020-06-24 DIAGNOSIS — I10 Essential (primary) hypertension: Secondary | ICD-10-CM | POA: Diagnosis not present

## 2020-06-24 DIAGNOSIS — R296 Repeated falls: Secondary | ICD-10-CM | POA: Diagnosis not present

## 2020-06-30 DIAGNOSIS — M545 Low back pain: Secondary | ICD-10-CM | POA: Diagnosis not present

## 2020-07-03 DIAGNOSIS — I10 Essential (primary) hypertension: Secondary | ICD-10-CM | POA: Diagnosis not present

## 2020-07-03 DIAGNOSIS — Z79899 Other long term (current) drug therapy: Secondary | ICD-10-CM | POA: Diagnosis not present

## 2020-07-03 DIAGNOSIS — R0602 Shortness of breath: Secondary | ICD-10-CM | POA: Diagnosis not present

## 2020-07-03 DIAGNOSIS — Z743 Need for continuous supervision: Secondary | ICD-10-CM | POA: Diagnosis not present

## 2020-07-03 DIAGNOSIS — Z8673 Personal history of transient ischemic attack (TIA), and cerebral infarction without residual deficits: Secondary | ICD-10-CM | POA: Diagnosis not present

## 2020-07-03 DIAGNOSIS — K2100 Gastro-esophageal reflux disease with esophagitis, without bleeding: Secondary | ICD-10-CM | POA: Diagnosis not present

## 2020-07-03 DIAGNOSIS — N170 Acute kidney failure with tubular necrosis: Secondary | ICD-10-CM | POA: Diagnosis not present

## 2020-07-03 DIAGNOSIS — I63511 Cerebral infarction due to unspecified occlusion or stenosis of right middle cerebral artery: Secondary | ICD-10-CM | POA: Diagnosis not present

## 2020-07-03 DIAGNOSIS — G319 Degenerative disease of nervous system, unspecified: Secondary | ICD-10-CM | POA: Diagnosis not present

## 2020-07-03 DIAGNOSIS — I7090 Unspecified atherosclerosis: Secondary | ICD-10-CM | POA: Diagnosis not present

## 2020-07-03 DIAGNOSIS — J3489 Other specified disorders of nose and nasal sinuses: Secondary | ICD-10-CM | POA: Diagnosis not present

## 2020-07-03 DIAGNOSIS — M1712 Unilateral primary osteoarthritis, left knee: Secondary | ICD-10-CM | POA: Diagnosis not present

## 2020-07-03 DIAGNOSIS — G301 Alzheimer's disease with late onset: Secondary | ICD-10-CM | POA: Diagnosis not present

## 2020-07-03 DIAGNOSIS — I6389 Other cerebral infarction: Secondary | ICD-10-CM | POA: Diagnosis not present

## 2020-07-03 DIAGNOSIS — Z955 Presence of coronary angioplasty implant and graft: Secondary | ICD-10-CM | POA: Diagnosis not present

## 2020-07-03 DIAGNOSIS — S72352A Displaced comminuted fracture of shaft of left femur, initial encounter for closed fracture: Secondary | ICD-10-CM | POA: Diagnosis not present

## 2020-07-03 DIAGNOSIS — S72142A Displaced intertrochanteric fracture of left femur, initial encounter for closed fracture: Secondary | ICD-10-CM | POA: Diagnosis not present

## 2020-07-03 DIAGNOSIS — I2510 Atherosclerotic heart disease of native coronary artery without angina pectoris: Secondary | ICD-10-CM | POA: Diagnosis not present

## 2020-07-03 DIAGNOSIS — R52 Pain, unspecified: Secondary | ICD-10-CM | POA: Diagnosis not present

## 2020-07-03 DIAGNOSIS — S72002A Fracture of unspecified part of neck of left femur, initial encounter for closed fracture: Secondary | ICD-10-CM | POA: Diagnosis not present

## 2020-07-03 DIAGNOSIS — D62 Acute posthemorrhagic anemia: Secondary | ICD-10-CM | POA: Diagnosis not present

## 2020-07-03 DIAGNOSIS — R0902 Hypoxemia: Secondary | ICD-10-CM | POA: Diagnosis not present

## 2020-07-03 DIAGNOSIS — R079 Chest pain, unspecified: Secondary | ICD-10-CM | POA: Diagnosis not present

## 2020-07-03 DIAGNOSIS — Z794 Long term (current) use of insulin: Secondary | ICD-10-CM | POA: Diagnosis not present

## 2020-07-03 DIAGNOSIS — D649 Anemia, unspecified: Secondary | ICD-10-CM | POA: Diagnosis not present

## 2020-07-03 DIAGNOSIS — E119 Type 2 diabetes mellitus without complications: Secondary | ICD-10-CM | POA: Diagnosis not present

## 2020-07-03 DIAGNOSIS — I739 Peripheral vascular disease, unspecified: Secondary | ICD-10-CM | POA: Diagnosis not present

## 2020-07-03 DIAGNOSIS — S72022A Displaced fracture of epiphysis (separation) (upper) of left femur, initial encounter for closed fracture: Secondary | ICD-10-CM | POA: Diagnosis not present

## 2020-07-03 DIAGNOSIS — R1310 Dysphagia, unspecified: Secondary | ICD-10-CM | POA: Diagnosis not present

## 2020-07-03 DIAGNOSIS — I252 Old myocardial infarction: Secondary | ICD-10-CM | POA: Diagnosis not present

## 2020-07-03 DIAGNOSIS — R069 Unspecified abnormalities of breathing: Secondary | ICD-10-CM | POA: Diagnosis not present

## 2020-07-03 DIAGNOSIS — I639 Cerebral infarction, unspecified: Secondary | ICD-10-CM | POA: Diagnosis not present

## 2020-07-03 DIAGNOSIS — Z515 Encounter for palliative care: Secondary | ICD-10-CM | POA: Diagnosis not present

## 2020-07-03 DIAGNOSIS — E1165 Type 2 diabetes mellitus with hyperglycemia: Secondary | ICD-10-CM | POA: Diagnosis not present

## 2020-07-30 DEATH — deceased

## 2020-08-16 IMAGING — CT CT HEAD W/O CM
4 series · 15 of 47 positions shown, 17 images · non-contrast
Comparison: Prior CT from 07/11/2018

CLINICAL DATA: Follow-up examination for acute stroke.

EXAM:
CT HEAD WITHOUT CONTRAST
TECHNIQUE: Contiguous axial images were obtained from the base of the skull
through the vertex without intravenous contrast.

[Series 3: head without · axial · non-contrast · 0.43mm/px · z∈[-185,-65]mm · 7 of 33 slices shown, 9 images]
[im 5/33  brain]
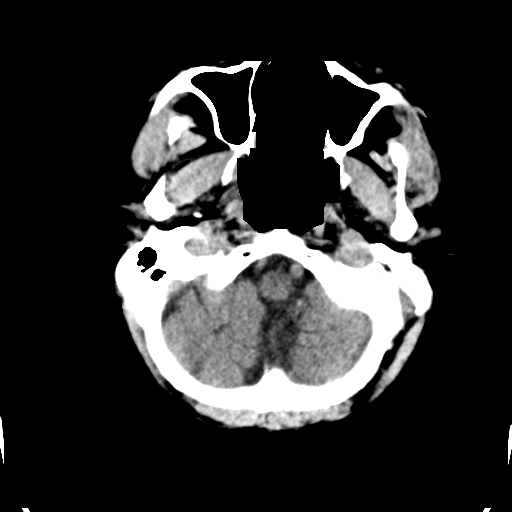
[im 5/33  bone]
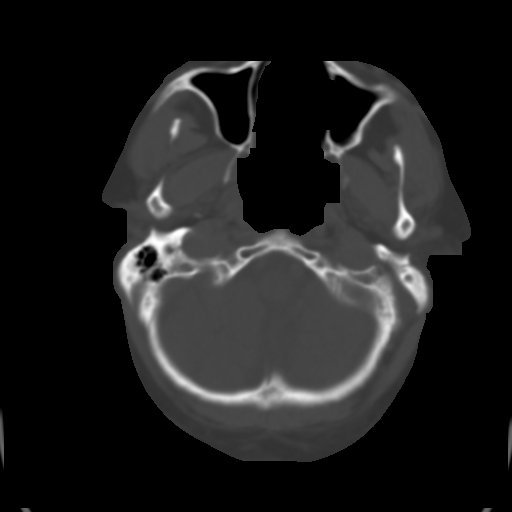
[im 9/33  brain]
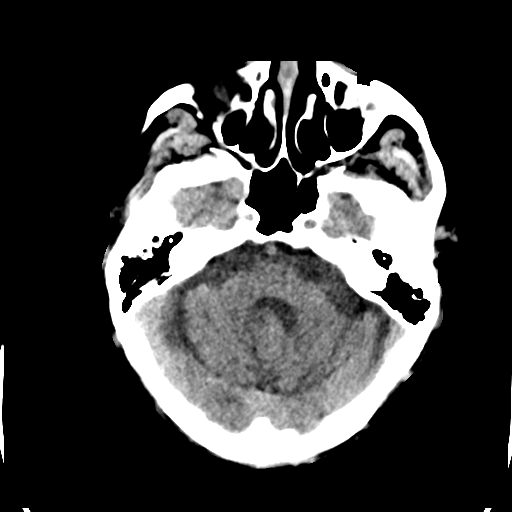
[im 13/33  brain]
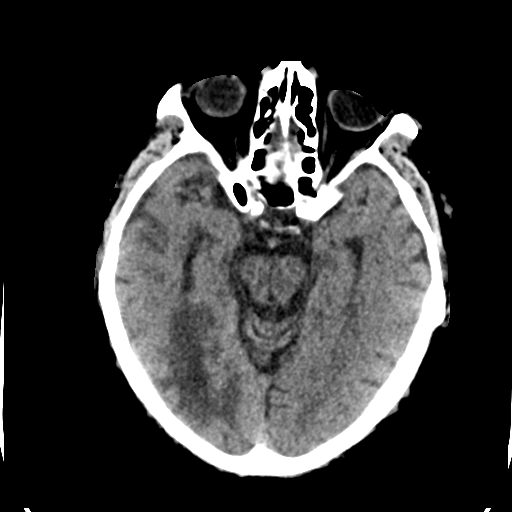
[im 17/33  brain]
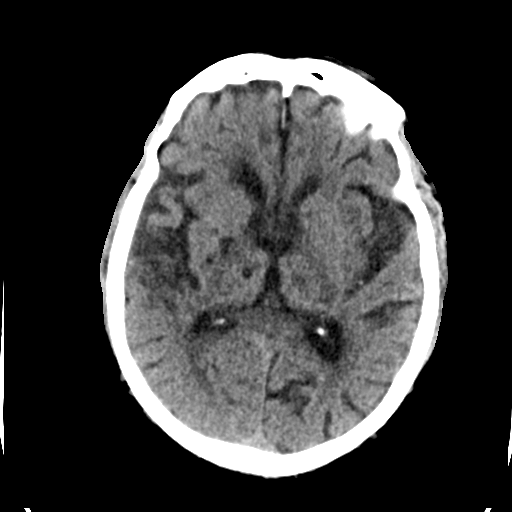
[im 21/33  brain]
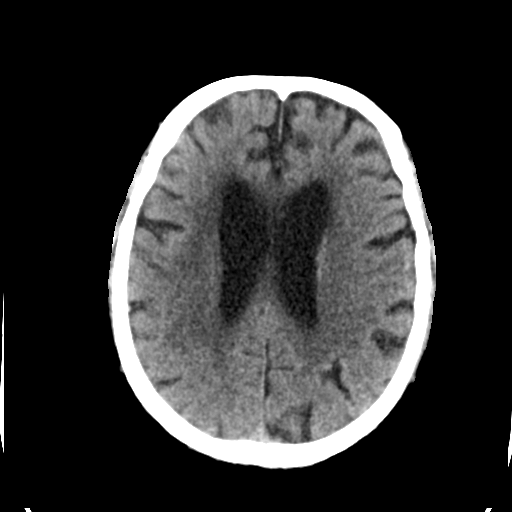
[im 21/33  bone]
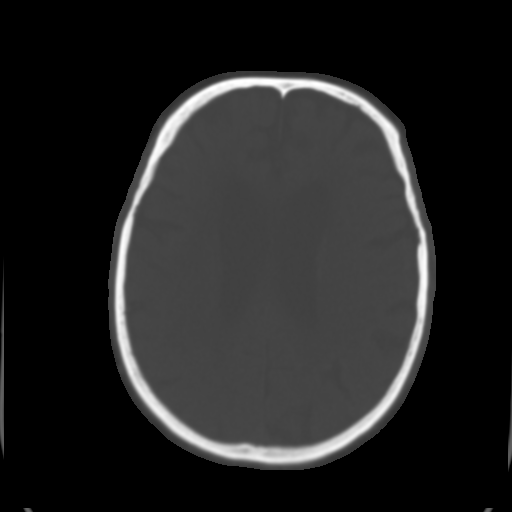
[im 25/33  brain]
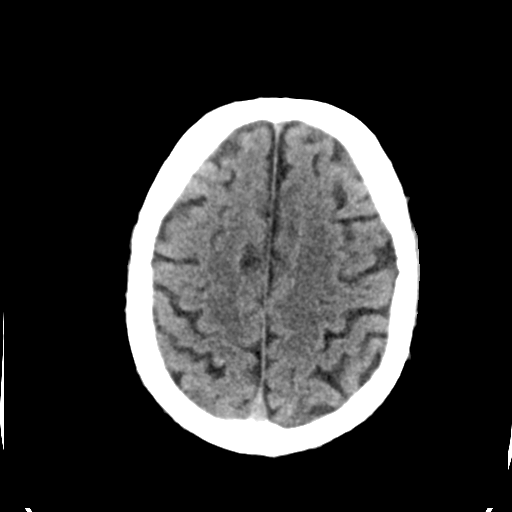
[im 29/33  brain]
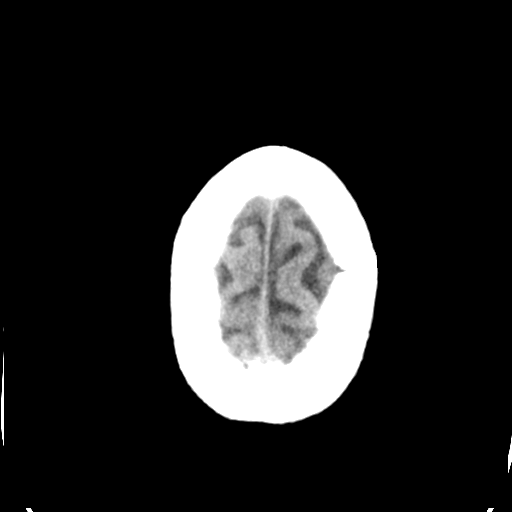

[Series 4: head bone · axial · 0.43mm/px · z∈[-189,-173]mm · 2 of 83 slices shown]
[im 9/83  bone]
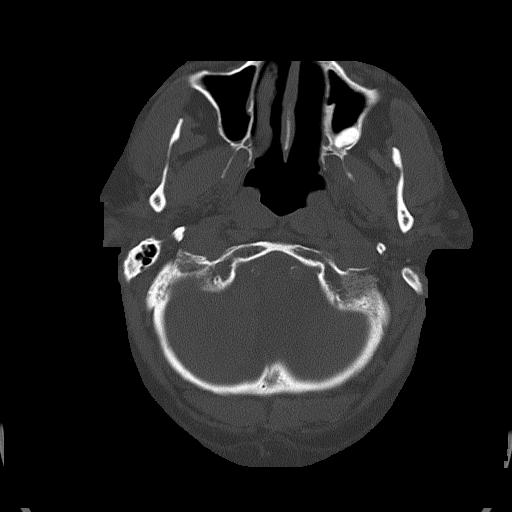
[im 17/83  bone]
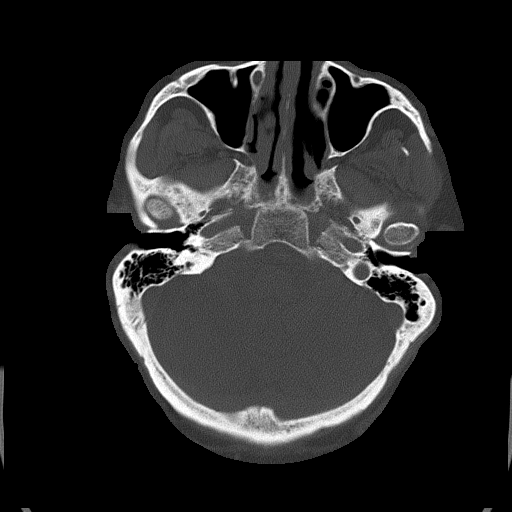

[Series 5: head without cor · coronal · non-contrast · 0.32mm/px · 3 of 70 slices shown]
[im 24/70  brain]
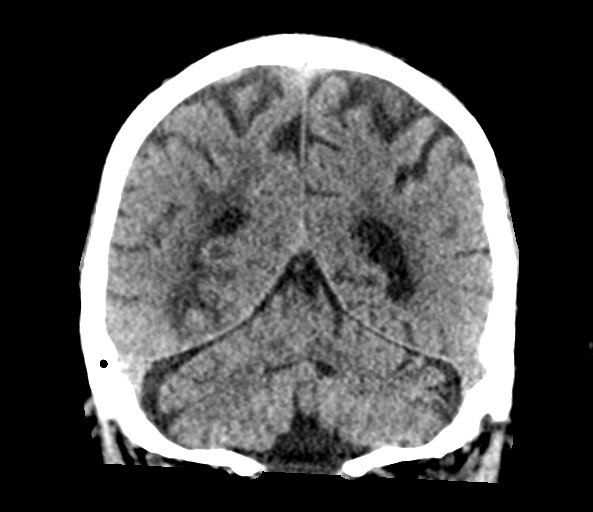
[im 31/70  brain]
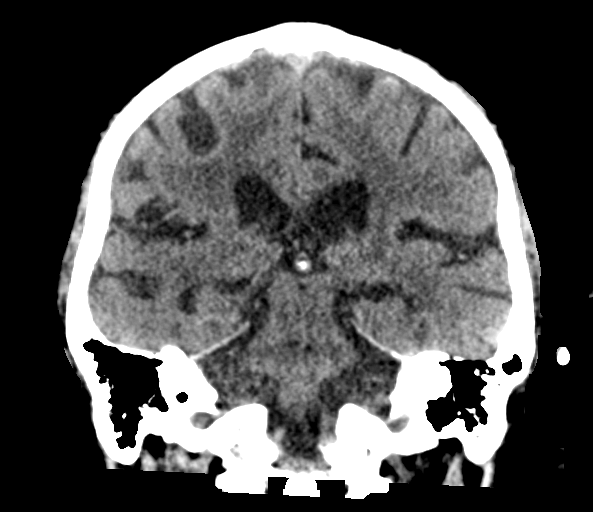
[im 39/70  brain]
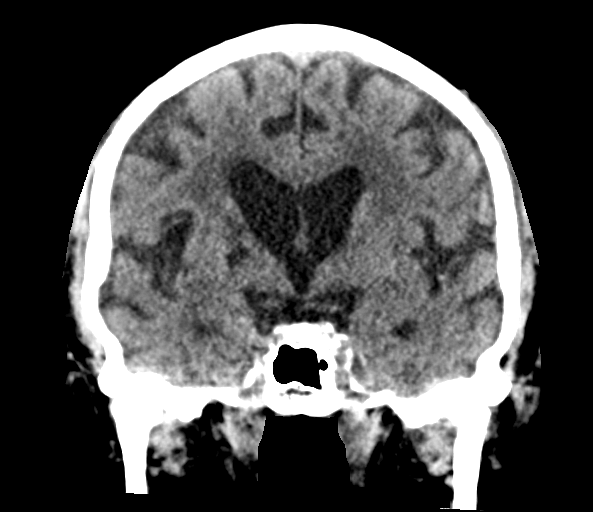

[Series 6: head without sag · sagittal · non-contrast · 0.32mm/px · 3 of 60 slices shown]
[im 20/60  brain]
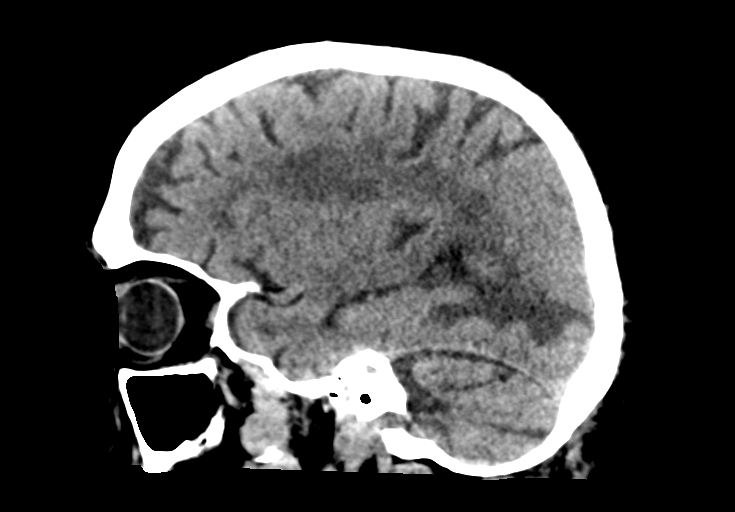
[im 30/60  brain]
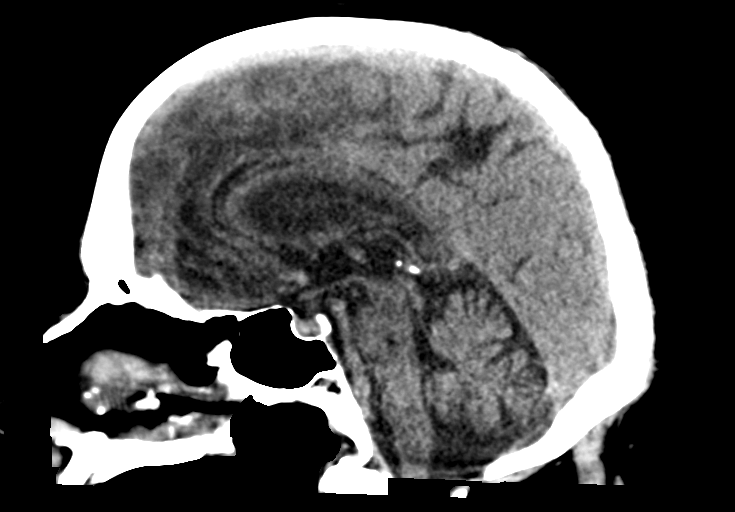
[im 40/60  brain]
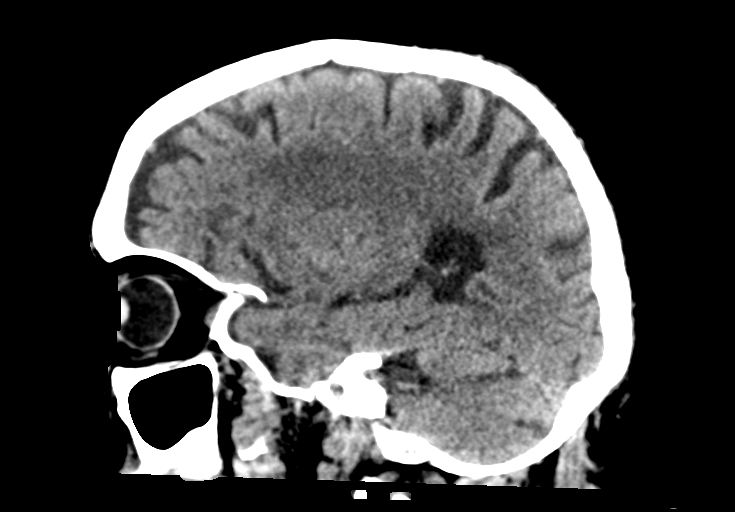

[15 of 47 positions shown; findings below may reference images not displayed]

FINDINGS: Brain: Atrophy with chronic small vessel ischemic disease. Scatter
remote right basal ganglia and bilateral thalamic lacunar infarcts
again noted. Few scattered remote cerebellar infarcts.

Evolving subacute right occipital infarct again seen, relatively
stable in size and distribution. Mildly increased localized edema
without significant regional mass effect. Associated hemorrhage
again seen, better appreciated on previous MRI.

No other new acute intracranial infarct. No intracranial hemorrhage.
No midline shift or mass effect. Ventricular prominence related
global parenchymal volume loss of hydrocephalus. No extra-axial
fluid collection.

Vascular: No hyperdense vessel. Scattered vascular calcifications
noted within the carotid siphons.

Skull: Scalp soft tissues and calvarium demonstrate no acute
abnormality.

Sinuses/Orbits: Globes normal soft tissues within normal limits.

Other: Mild scattered mucosal thickening within the ethmoidal air
cells. Paranasal sinuses and mastoid air cells are otherwise clear.
IMPRESSION: 1. Continued interval evolution of subacute hemorrhagic right
occipital lobe infarct. Mild localized edema without significant
regional mass effect. Fairly mild hemorrhage visible by CT, better
appreciated on recent MRI. No other complication.
2. No other new acute intracranial abnormality.
3. Atrophy with advanced chronic microvascular ischemic disease.

## 2022-05-26 IMAGING — CT CT ABD-PELV W/ CM
2 series · 12 of 28 positions shown, 17 images · IV contrast (Omni 300)
Comparison: CT 2 and half weeks ago 04/07/2020

CLINICAL DATA: Acute nonlocalized abdominal pain. Several falls in
the last few days. History of bladder cancer.

EXAM:
CT ABDOMEN AND PELVIS WITH CONTRAST
TECHNIQUE: Multidetector CT imaging of the abdomen and pelvis was performed
using the standard protocol following bolus administration of
intravenous contrast.
CONTRAST:  80mL OMNIPAQUE IOHEXOL 300 MG/ML  SOLN

[Series 4: lung · axial · 0.79mm/px · z∈[+511,+611]mm · 11 of 24 slices shown, 15 images]
[im 3/24  soft-tissue]
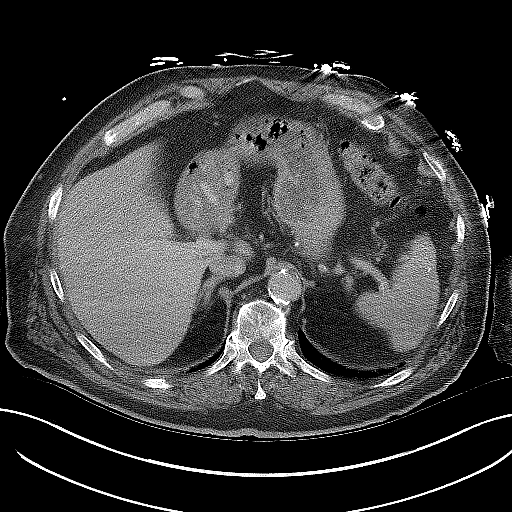
[im 3/24  bone]
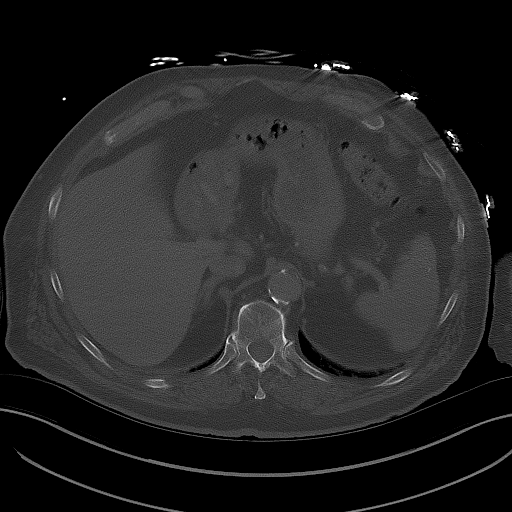
[im 5/24  soft-tissue]
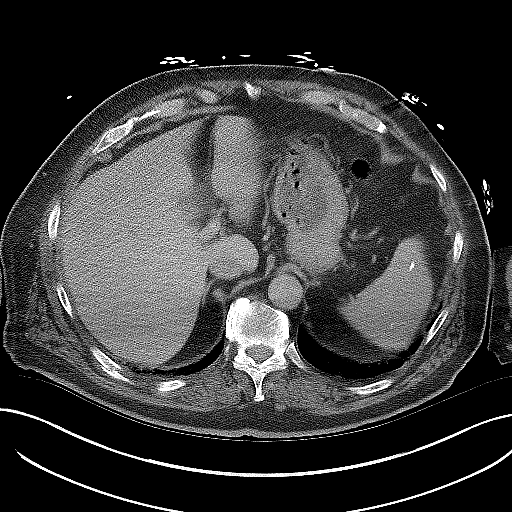
[im 8/24  soft-tissue]
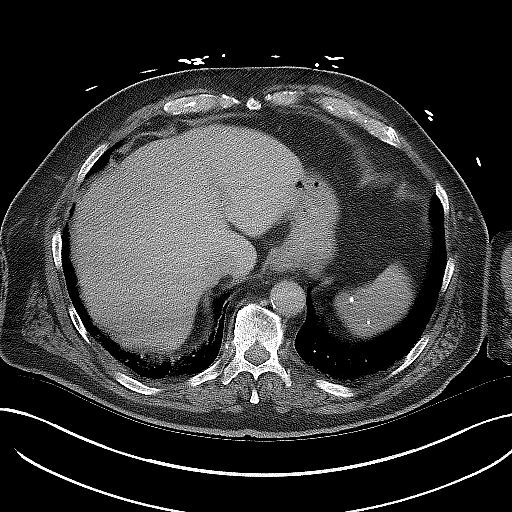
[im 10/24  soft-tissue]
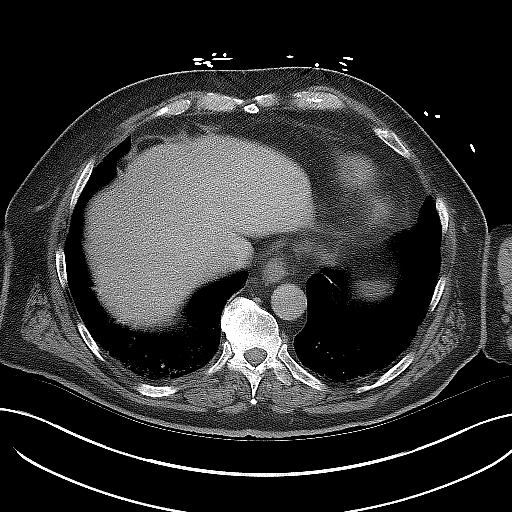
[im 13/24  soft-tissue]
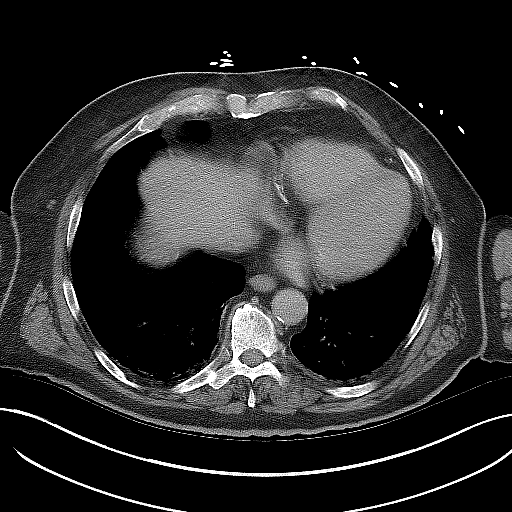
[im 15/24  soft-tissue]
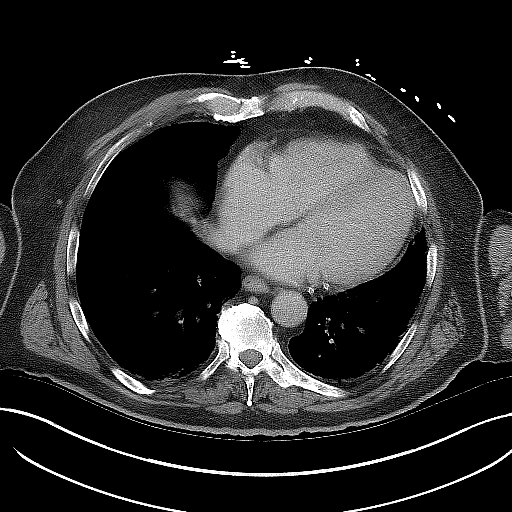
[im 17/24  soft-tissue]
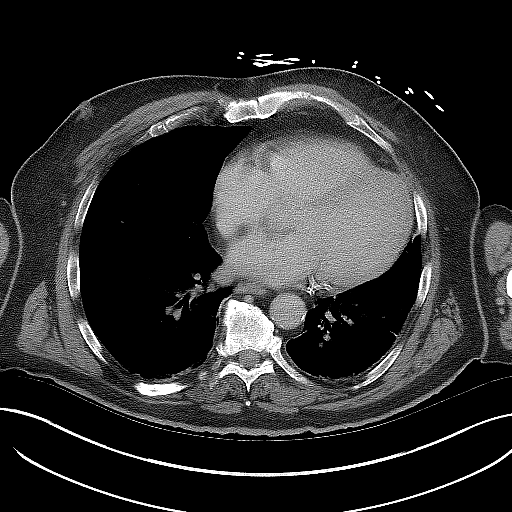
[im 20/24  soft-tissue]
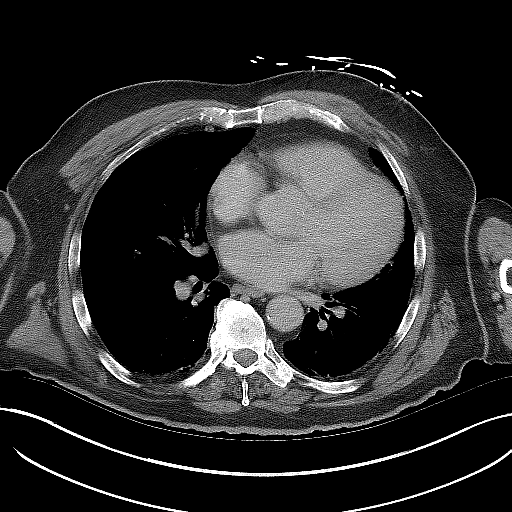
[im 20/24  lung]
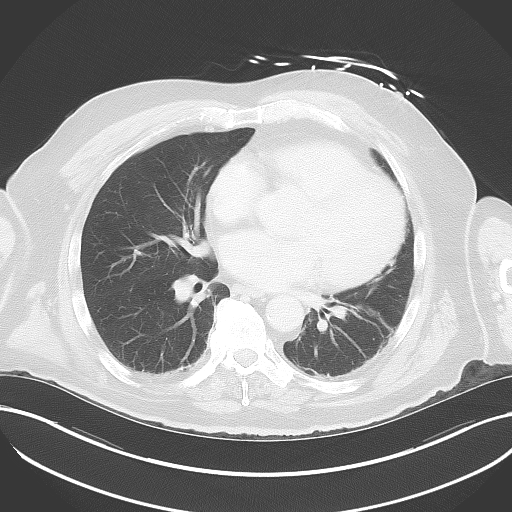
[im 21/24  lung]
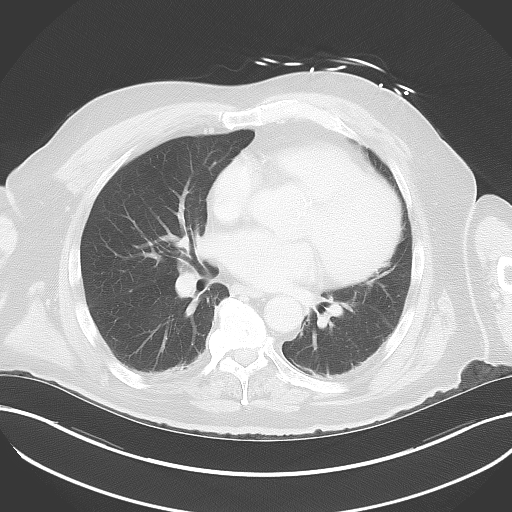
[im 22/24  soft-tissue]
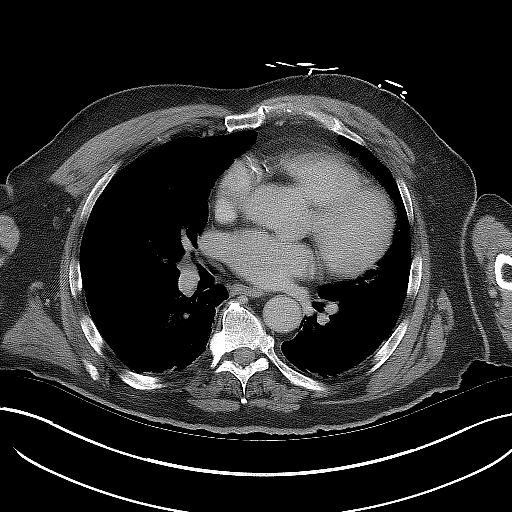
[im 22/24  lung]
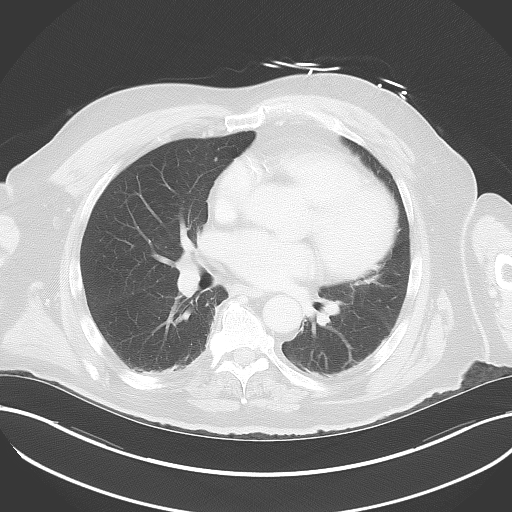
[im 22/24  bone]
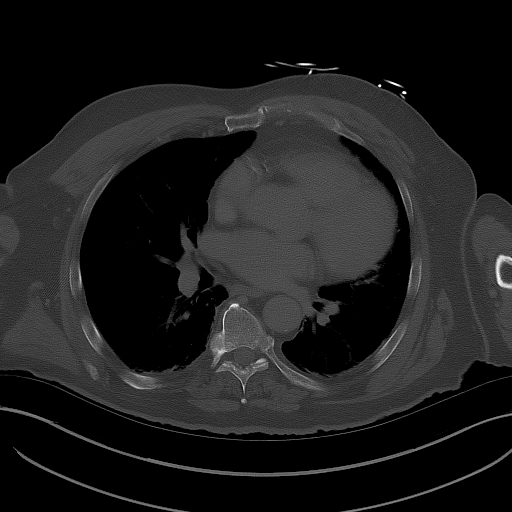
[im 23/24  lung]
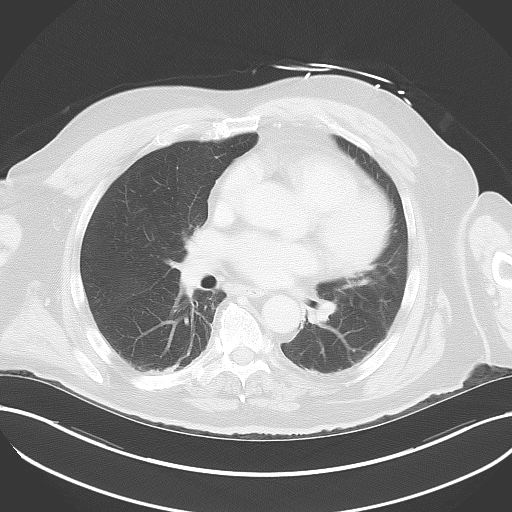

[Series 7: a/p w/ sag · sagittal · 0.57mm/px · 1 of 214 slices shown, 2 images]
[im 72/214  soft-tissue]
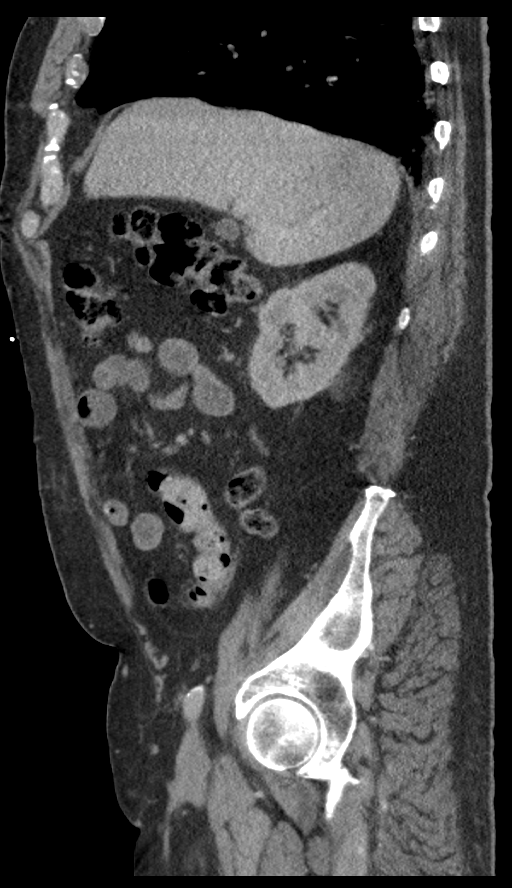
[im 72/214  bone]
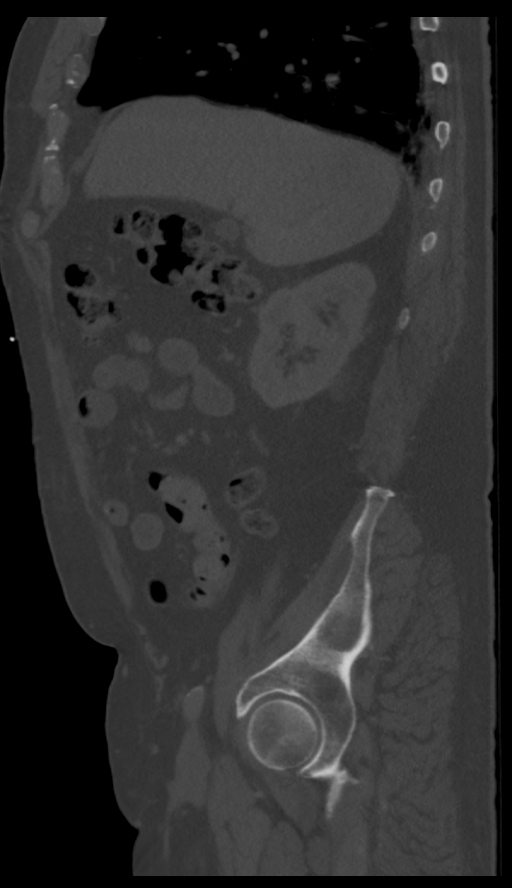

[12 of 28 positions shown; findings below may reference images not displayed]

FINDINGS: Lower chest: Cardiomegaly with coronary artery calcifications. Lower
lobe atelectasis. No pleural fluid. No basilar pulmonary nodule.

Hepatobiliary: No focal liver abnormality is seen. No evidence of
injury. No gallstones, gallbladder wall thickening, or biliary
dilatation.

Pancreas: Parenchymal atrophy. No ductal dilatation or inflammation.

Spleen: Calcified granuloma. Normal in size. No evidence of injury.

Adrenals/Urinary Tract: No adrenal nodule or hemorrhage. No
hydronephrosis or perinephric edema. Mild cortical scarring in the
lower right kidney. Lobulated renal contours. Both ureters are
decompressed. There is symmetric renal excretion on delayed phase
imaging. Partially distended urinary bladder with mild diffuse wall
thickening. Faint perivesicular fat stranding.

Stomach/Bowel: Ingested material within the stomach. No small bowel
obstruction or inflammation. Fecalization of distal small bowel
contents. Normal appendix. Moderate colonic stool burden. No colonic
wall thickening. Sigmoid colon is tortuous. Scattered sigmoid
colonic diverticula without diverticulitis.

Vascular/Lymphatic: Aorto bi-iliac atherosclerosis. No aortic
aneurysm. The portal vein is patent. Circumaortic left renal vein.
No adenopathy.

Reproductive: Prostate gland is unremarkable.

Other: No free air or ascites. Tiny fat containing umbilical hernia.
Minimal fat in the inguinal canals.

Musculoskeletal: Lumbar spine assessed on concurrent lumbar CT,
reported separately. Bony pelvis is intact.
IMPRESSION: 1. Mild diffuse bladder wall thickening with mild perivesicular fat
stranding, can be seen with cystitis.
2. No additional acute abnormality in the abdomen/pelvis.
3. Minor colonic diverticulosis without diverticulitis.

Aortic Atherosclerosis ([id number]-GO1.1).
